# Patient Record
Sex: Female | Born: 1990 | Race: Black or African American | Hispanic: No | Marital: Single | Smoking: Current every day smoker
Health system: Southern US, Community
[De-identification: ages and names within clinical notes are randomized; demographics above are authoritative.]

## PROBLEM LIST (undated history)

## (undated) ENCOUNTER — Inpatient Hospital Stay (HOSPITAL_COMMUNITY): Payer: Self-pay

## (undated) DIAGNOSIS — F32A Depression, unspecified: Secondary | ICD-10-CM

## (undated) DIAGNOSIS — L309 Dermatitis, unspecified: Secondary | ICD-10-CM

## (undated) DIAGNOSIS — T7840XA Allergy, unspecified, initial encounter: Secondary | ICD-10-CM

## (undated) DIAGNOSIS — D649 Anemia, unspecified: Secondary | ICD-10-CM

## (undated) DIAGNOSIS — IMO0002 Reserved for concepts with insufficient information to code with codable children: Secondary | ICD-10-CM

## (undated) DIAGNOSIS — F419 Anxiety disorder, unspecified: Secondary | ICD-10-CM

## (undated) DIAGNOSIS — R51 Headache: Secondary | ICD-10-CM

## (undated) DIAGNOSIS — R87619 Unspecified abnormal cytological findings in specimens from cervix uteri: Secondary | ICD-10-CM

## (undated) DIAGNOSIS — S82899A Other fracture of unspecified lower leg, initial encounter for closed fracture: Secondary | ICD-10-CM

## (undated) DIAGNOSIS — J45909 Unspecified asthma, uncomplicated: Secondary | ICD-10-CM

## (undated) DIAGNOSIS — B999 Unspecified infectious disease: Secondary | ICD-10-CM

## (undated) DIAGNOSIS — J302 Other seasonal allergic rhinitis: Secondary | ICD-10-CM

## (undated) HISTORY — DX: Anemia, unspecified: D64.9

## (undated) HISTORY — DX: Allergy, unspecified, initial encounter: T78.40XA

## (undated) HISTORY — DX: Anxiety disorder, unspecified: F41.9

## (undated) HISTORY — DX: Depression, unspecified: F32.A

## (undated) HISTORY — DX: Unspecified infectious disease: B99.9

## (undated) HISTORY — DX: Other fracture of unspecified lower leg, initial encounter for closed fracture: S82.899A

## (undated) HISTORY — PX: FOOT SURGERY: SHX648

---
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
## 2011-05-25 ENCOUNTER — Emergency Department (HOSPITAL_COMMUNITY)
Admission: EM | Admit: 2011-05-25 | Discharge: 2011-05-26 | Disposition: A | Discharge status: Home / Self Care | Payer: Self-pay | Attending: Emergency Medicine | Admitting: Emergency Medicine

## 2011-05-25 DIAGNOSIS — N898 Other specified noninflammatory disorders of vagina: Secondary | ICD-10-CM | POA: Insufficient documentation

## 2011-05-25 DIAGNOSIS — R300 Dysuria: Secondary | ICD-10-CM | POA: Insufficient documentation

## 2011-05-25 LAB — URINALYSIS, ROUTINE W REFLEX MICROSCOPIC
Bilirubin Urine: NEGATIVE
Glucose, UA: NEGATIVE mg/dL
Hgb urine dipstick: NEGATIVE
Ketones, ur: NEGATIVE mg/dL
Leukocytes, UA: NEGATIVE
Nitrite: NEGATIVE
Protein, ur: NEGATIVE mg/dL
Specific Gravity, Urine: 1.008 (ref 1.005–1.030)
Urobilinogen, UA: 0.2 mg/dL (ref 0.0–1.0)
pH: 7 (ref 5.0–8.0)

## 2011-05-25 LAB — POCT PREGNANCY, URINE: Preg Test, Ur: NEGATIVE

## 2011-05-25 LAB — WET PREP, GENITAL
Trich, Wet Prep: NONE SEEN
WBC, Wet Prep HPF POC: NONE SEEN
Yeast Wet Prep HPF POC: NONE SEEN

## 2011-05-27 LAB — GC/CHLAMYDIA PROBE AMP, GENITAL
Chlamydia, DNA Probe: POSITIVE — AB
GC Probe Amp, Genital: NEGATIVE

## 2011-06-17 LAB — POCT URINALYSIS DIP (DEVICE)
Ketones, ur: 15 mg/dL — AB
Protein, ur: >=300 mg/dL — AB
Specific Gravity, Urine: 1.015 (ref 1.005–1.030)
Urobilinogen, UA: 2 mg/dL — ABNORMAL HIGH (ref 0.0–1.0)
pH: 7 (ref 5.0–8.0)

## 2011-06-17 LAB — POCT PREGNANCY, URINE: Preg Test, Ur: NEGATIVE

## 2011-06-27 LAB — WOUND CULTURE

## 2012-05-24 ENCOUNTER — Inpatient Hospital Stay (HOSPITAL_COMMUNITY)
Admission: AD | Admit: 2012-05-24 | Discharge: 2012-05-24 | Disposition: A | Discharge status: Hospice | Payer: Medicaid Other | Source: Ambulatory Visit | Attending: Obstetrics & Gynecology | Admitting: Obstetrics & Gynecology

## 2012-05-24 ENCOUNTER — Encounter (HOSPITAL_COMMUNITY): Payer: Self-pay | Admitting: *Deleted

## 2012-05-24 DIAGNOSIS — N912 Amenorrhea, unspecified: Secondary | ICD-10-CM

## 2012-05-24 DIAGNOSIS — Z3201 Encounter for pregnancy test, result positive: Secondary | ICD-10-CM | POA: Insufficient documentation

## 2012-05-24 DIAGNOSIS — O210 Mild hyperemesis gravidarum: Secondary | ICD-10-CM

## 2012-05-24 DIAGNOSIS — R112 Nausea with vomiting, unspecified: Secondary | ICD-10-CM | POA: Insufficient documentation

## 2012-05-24 HISTORY — DX: Unspecified abnormal cytological findings in specimens from cervix uteri: R87.619

## 2012-05-24 HISTORY — DX: Unspecified asthma, uncomplicated: J45.909

## 2012-05-24 HISTORY — DX: Reserved for concepts with insufficient information to code with codable children: IMO0002

## 2012-05-24 HISTORY — DX: Other seasonal allergic rhinitis: J30.2

## 2012-05-24 HISTORY — DX: Headache: R51

## 2012-05-24 HISTORY — DX: Dermatitis, unspecified: L30.9

## 2012-05-24 LAB — URINALYSIS, ROUTINE W REFLEX MICROSCOPIC
Hgb urine dipstick: NEGATIVE
Leukocytes, UA: NEGATIVE
Protein, ur: 30 mg/dL — AB
Specific Gravity, Urine: 1.015 (ref 1.005–1.030)
Urobilinogen, UA: 0.2 mg/dL (ref 0.0–1.0)

## 2012-05-24 LAB — POCT PREGNANCY, URINE: Preg Test, Ur: POSITIVE — AB

## 2012-05-24 LAB — URINE MICROSCOPIC-ADD ON

## 2012-05-24 MED ORDER — PROMETHAZINE HCL 25 MG PO TABS: 25.0000 mg | ORAL_TABLET | Freq: Four times a day (QID) | ORAL | Status: DC | PRN

## 2012-05-24 NOTE — MAU Note (Signed)
pateint is in to confirm pregnancy. She c/o n/v and bilateral breast soreness, lmp 04/07/12

## 2012-05-24 NOTE — MAU Provider Note (Signed)
Attestation of Attending Supervision of Advanced Practitioner (CNM/NP): Evaluation and management procedures were performed by the Advanced Practitioner under my supervision and collaboration.  I have reviewed the Advanced Practitioner's note and chart, and I agree with the management and plan.  UGONNA  ANYANWU, MD, FACOG Attending Obstetrician & Gynecologist Faculty Practice, [facility]  

## 2012-05-24 NOTE — Progress Notes (Signed)
Patient is not in lobby 

## 2012-05-24 NOTE — MAU Provider Note (Signed)
  History     CSN: [id number]  Arrival date and time: 05/24/12 2007   None     Chief Complaint  Patient presents with  . Nausea  . Emesis  . Amenorrhea   HPI Pt is here for confirmation of pregnancy with LMP of 04/07/2012.  She denies pregnancy complaints, except for nausea every morning.  Past Medical History  Diagnosis Date  . Abnormal Pap smear   . Asthma   . Headache   . Seasonal allergies   . Eczema     Past Surgical History  Procedure Date  . Foot surgery     Family History  Problem Relation Age of Onset  . Hypertension Father   . Cancer Maternal Grandmother   . Diabetes Maternal Grandmother     History  Substance Use Topics  . Smoking status: Current Every Day Smoker    Types: Cigarettes  . Smokeless tobacco: Not on file  . Alcohol Use: 0.0 oz/week    2-3 Shots of liquor per week     weekly    Allergies: No Known Allergies  No prescriptions prior to admission    ROS Physical Exam   Blood pressure 131/54, pulse 76, temperature 98.5 F (36.9 C), temperature source Oral, resp. rate 16, height 5\' 5"  (1.651 m), weight 60.555 kg (133 lb 8 oz), last menstrual period 04/07/2012, SpO2 100.00%.  Physical Exam  Vitals reviewed. Constitutional: She is oriented to person, place, and time. She appears well-developed and well-nourished.  HENT:  Head: Normocephalic.  Eyes: Pupils are equal, round, and reactive to light.  Neck: Normal range of motion. Neck supple.  Cardiovascular: Normal rate.   Respiratory: Effort normal.  GI: Soft.  Musculoskeletal: Normal range of motion.  Neurological: She is alert and oriented to person, place, and time.  Skin: Skin is warm and dry.  Psychiatric: She has a normal mood and affect.    MAU Course  Procedures     Assessment and Plan  Positive pregnancy test- confirmation of pregnancy letter Proceed with Pregnancy Medicaid application ABCs of pregnancy Nausea in pregnancy- prescription for  phenergan  Lindsey Castillo 05/24/2012, 9:01 PM

## 2012-05-24 NOTE — MAU Note (Signed)
Pt says she would like to have a proof of pregnancy.

## 2012-06-16 ENCOUNTER — Encounter (HOSPITAL_COMMUNITY): Payer: Self-pay | Admitting: *Deleted

## 2012-06-16 ENCOUNTER — Inpatient Hospital Stay (HOSPITAL_COMMUNITY)
Admission: AD | Admit: 2012-06-16 | Discharge: 2012-06-16 | Disposition: A | Discharge status: Home / Self Care | Payer: Medicaid Other | Source: Ambulatory Visit | Attending: Obstetrics & Gynecology | Admitting: Obstetrics & Gynecology

## 2012-06-16 DIAGNOSIS — B9689 Other specified bacterial agents as the cause of diseases classified elsewhere: Secondary | ICD-10-CM | POA: Insufficient documentation

## 2012-06-16 DIAGNOSIS — N760 Acute vaginitis: Secondary | ICD-10-CM | POA: Insufficient documentation

## 2012-06-16 DIAGNOSIS — A499 Bacterial infection, unspecified: Secondary | ICD-10-CM | POA: Insufficient documentation

## 2012-06-16 DIAGNOSIS — O209 Hemorrhage in early pregnancy, unspecified: Secondary | ICD-10-CM | POA: Insufficient documentation

## 2012-06-16 DIAGNOSIS — O2390 Unspecified genitourinary tract infection in pregnancy, unspecified trimester: Secondary | ICD-10-CM | POA: Insufficient documentation

## 2012-06-16 LAB — WET PREP, GENITAL: Trich, Wet Prep: NONE SEEN

## 2012-06-16 LAB — URINALYSIS, ROUTINE W REFLEX MICROSCOPIC
Bilirubin Urine: NEGATIVE
Glucose, UA: NEGATIVE mg/dL
Ketones, ur: NEGATIVE mg/dL
Protein, ur: NEGATIVE mg/dL
Urobilinogen, UA: 0.2 mg/dL (ref 0.0–1.0)

## 2012-06-16 LAB — URINE MICROSCOPIC-ADD ON

## 2012-06-16 LAB — ABO/RH: ABO/RH(D): AB POS

## 2012-06-16 MED ORDER — METRONIDAZOLE 500 MG PO TABS: 500.0000 mg | ORAL_TABLET | Freq: Two times a day (BID) | ORAL | Status: DC

## 2012-06-16 NOTE — MAU Note (Signed)
Pt presents for bleeding that started at 1030 this morning.  She woke up and there was dark red blood in underwear as well as when she wiped.  Bleeding has since decreased and is now only when she wipes after using restroom.

## 2012-06-16 NOTE — MAU Provider Note (Signed)
History     CSN: [id number]  Arrival date and time: 06/16/12 1203   First Provider Initiated Contact with Patient 06/16/12 1253      Chief Complaint  Patient presents with  . Vaginal Bleeding   HPI 21 y.o. G1P0 at [redacted]w[redacted]d with small vaginal bleeding noted this morning, now only notices spotting with wiping. No pain. Plans on prenatal care at [facility].   Past Medical History  Diagnosis Date  . Abnormal Pap smear   . Asthma   . Headache   . Seasonal allergies   . Eczema     Past Surgical History  Procedure Date  . Foot surgery     Family History  Problem Relation Age of Onset  . Hypertension Father   . Cancer Maternal Grandmother   . Diabetes Maternal Grandmother     History  Substance Use Topics  . Smoking status: Former Smoker    Types: Cigarettes    Quit date: 05/24/2012  . Smokeless tobacco: Not on file  . Alcohol Use: No     weekly    Allergies: No Known Allergies  No prescriptions prior to admission    Review of Systems  Constitutional: Negative.   Respiratory: Negative.   Cardiovascular: Negative.   Gastrointestinal: Negative for nausea, vomiting, abdominal pain, diarrhea and constipation.  Genitourinary: Negative for dysuria, urgency, frequency, hematuria and flank pain.       Positive for vaginal bleeding  Musculoskeletal: Negative.   Neurological: Negative.   Psychiatric/Behavioral: Negative.    Physical Exam   Blood pressure 102/64, pulse 81, temperature 99.1 F (37.3 C), temperature source Oral, resp. rate 18, height 5\' 5"  (1.651 m), weight 133 lb 3.2 oz (60.419 kg), last menstrual period 04/07/2012.  Physical Exam  Nursing note and vitals reviewed. Constitutional: She is oriented to person, place, and time. She appears well-developed and well-nourished. No distress.  Cardiovascular: Normal rate.   Respiratory: Effort normal.  GI: Soft. There is no tenderness.  Genitourinary: Vaginal discharge (blood tinged mucous) found.       Cervix  closed   Musculoskeletal: Normal range of motion.  Neurological: She is alert and oriented to person, place, and time.  Skin: Skin is warm and dry.  Psychiatric: She has a normal mood and affect.   + cardiac activity on bedside u/s MAU Course  Procedures  Results for orders placed during the hospital encounter of 06/16/12 (from the past 24 hour(s))  URINALYSIS, ROUTINE W REFLEX MICROSCOPIC     Status: Abnormal   Collection Time   06/16/12 12:05 PM      Component Value Range   Color, Urine YELLOW  YELLOW   APPearance CLEAR  CLEAR   Specific Gravity, Urine 1.015  1.005 - 1.030   pH 7.0  5.0 - 8.0   Glucose, UA NEGATIVE  NEGATIVE mg/dL   Hgb urine dipstick LARGE (*) NEGATIVE   Bilirubin Urine NEGATIVE  NEGATIVE   Ketones, ur NEGATIVE  NEGATIVE mg/dL   Protein, ur NEGATIVE  NEGATIVE mg/dL   Urobilinogen, UA 0.2  0.0 - 1.0 mg/dL   Nitrite NEGATIVE  NEGATIVE   Leukocytes, UA NEGATIVE  NEGATIVE  URINE MICROSCOPIC-ADD ON     Status: Abnormal   Collection Time   06/16/12 12:05 PM      Component Value Range   Squamous Epithelial / LPF FEW (*) RARE   WBC, UA 0-2  <3 WBC/hpf   RBC / HPF 3-6  <3 RBC/hpf   Bacteria, UA FEW (*) RARE  ABO/RH     Status: Normal (Preliminary result)   Collection Time   06/16/12 12:32 PM      Component Value Range   ABO/RH(D) AB POS    WET PREP, GENITAL     Status: Abnormal   Collection Time   06/16/12  1:00 PM      Component Value Range   Yeast Wet Prep HPF POC NONE SEEN  NONE SEEN   Trich, Wet Prep NONE SEEN  NONE SEEN   Clue Cells Wet Prep HPF POC FEW (*) NONE SEEN   WBC, Wet Prep HPF POC FEW (*) NONE SEEN     Assessment and Plan   1. BV (bacterial vaginosis)   2. Bleeding in early pregnancy      Medication List     As of 06/16/2012  1:37 PM    START taking these medications         metroNIDAZOLE 500 MG tablet   Commonly known as: FLAGYL   Take 1 tablet (500 mg total) by mouth 2 (two) times daily.      CONTINUE taking these medications          * albuterol 108 (90 BASE) MCG/ACT inhaler   Commonly known as: PROVENTIL HFA;VENTOLIN HFA      * albuterol (5 MG/ML) 0.5% nebulizer solution   Commonly known as: PROVENTIL      flintstones complete 60 MG chewable tablet      montelukast 10 MG tablet   Commonly known as: SINGULAIR      promethazine 25 MG tablet   Commonly known as: PHENERGAN   Take 1 tablet (25 mg total) by mouth every 6 (six) hours as needed for nausea.      triamcinolone cream 0.1 %   Commonly known as: KENALOG     * Notice: This list has 2 medication(s) that are the same as other medications prescribed for you. Read the directions carefully, and ask your doctor or other care provider to review them with you.        Where to get your medications    These are the prescriptions that you need to pick up. We sent them to a specific pharmacy, so you will need to go there to get them.   [facility] [address] [place], [place] - [address]    [address] [place] [address]    Phone: [phone]        metroNIDAZOLE 500 MG tablet           Start prenatal care as soon as possible Urine culture sent Precautions rev'd  Ossie Yebra 06/16/2012, 1:34 PM

## 2012-06-16 NOTE — MAU Note (Signed)
Pt reports having some bleeding in her panties this morning and now wiping blood is dark and brown. No c/o pain today.

## 2012-06-16 NOTE — MAU Provider Note (Signed)
Attestation of Attending Supervision of Advanced Practitioner (CNM/NP): Evaluation and management procedures were performed by the Advanced Practitioner under my supervision and collaboration.  I have reviewed the Advanced Practitioner's note and chart, and I agree with the management and plan.  HARRAWAY-SMITH, Kelise Kuch 4:57 PM     

## 2012-06-23 ENCOUNTER — Inpatient Hospital Stay (HOSPITAL_COMMUNITY): Payer: Medicaid Other

## 2012-06-23 ENCOUNTER — Encounter (HOSPITAL_COMMUNITY): Payer: Self-pay | Admitting: *Deleted

## 2012-06-23 ENCOUNTER — Inpatient Hospital Stay (HOSPITAL_COMMUNITY)
Admission: AD | Admit: 2012-06-23 | Discharge: 2012-06-23 | Disposition: A | Discharge status: Home / Self Care | Payer: Medicaid Other | Source: Ambulatory Visit | Attending: Obstetrics and Gynecology | Admitting: Obstetrics and Gynecology

## 2012-06-23 DIAGNOSIS — O468X9 Other antepartum hemorrhage, unspecified trimester: Secondary | ICD-10-CM

## 2012-06-23 DIAGNOSIS — O418X90 Other specified disorders of amniotic fluid and membranes, unspecified trimester, not applicable or unspecified: Secondary | ICD-10-CM

## 2012-06-23 DIAGNOSIS — O209 Hemorrhage in early pregnancy, unspecified: Secondary | ICD-10-CM | POA: Insufficient documentation

## 2012-06-23 LAB — CBC
HCT: 32.4 % — ABNORMAL LOW (ref 36.0–46.0)
MCV: 85.7 fL (ref 78.0–100.0)
Platelets: 247 10*3/uL (ref 150–400)
RBC: 3.78 MIL/uL — ABNORMAL LOW (ref 3.87–5.11)
RDW: 14.3 % (ref 11.5–15.5)
WBC: 13.2 10*3/uL — ABNORMAL HIGH (ref 4.0–10.5)

## 2012-06-23 NOTE — MAU Note (Signed)
Pt presents for moderate bleeding that started this morning.  Bleeding has now turned from "burgandy" to brown.

## 2012-06-23 NOTE — MAU Note (Signed)
Patient states she started bleeding dark blood this am. Patient has tissue in her panties that has a small amount of brown discharge. Starting to have abdominal cramping.

## 2012-06-23 NOTE — MAU Provider Note (Signed)
History     CSN: [id number]  Arrival date and time: 06/23/12 1521   First Provider Initiated Contact with Patient 06/23/12 1625      Chief Complaint  Patient presents with  . Vaginal Bleeding   HPI Lindsey Castillo is a 21 y.o. female @ [redacted]w[redacted]d gestation who presents to MAU with vaginal bleeding. She was evaluated here 10/5 for bleeding and had an informal bed side ultrasound that showed a viable IUP. After that visit patient states she stopped bleeding but this morning started again and was heavier than before. She describes the bleeding as equal to a period. Associated symptoms include headache , nausea and lower abdominal pain. She rates the pain as 7/10.   OB History    Grav Para Term Preterm Abortions TAB SAB Ect Mult Living   1               Past Medical History  Diagnosis Date  . Abnormal Pap smear   . Asthma   . Headache   . Seasonal allergies   . Eczema     Past Surgical History  Procedure Date  . Foot surgery     Family History  Problem Relation Age of Onset  . Hypertension Father   . Cancer Maternal Grandmother   . Diabetes Maternal Grandmother     History  Substance Use Topics  . Smoking status: Former Smoker    Types: Cigarettes    Quit date: 05/24/2012  . Smokeless tobacco: Not on file  . Alcohol Use: No     weekly    Allergies: No Known Allergies  Prescriptions prior to admission  Medication Sig Dispense Refill  . albuterol (PROVENTIL HFA;VENTOLIN HFA) 108 (90 BASE) MCG/ACT inhaler Inhale 2 puffs into the lungs 2 (two) times daily as needed. For asthma      . albuterol (PROVENTIL) (5 MG/ML) 0.5% nebulizer solution Take 2.5 mg by nebulization every 6 (six) hours as needed.      . flintstones complete (FLINTSTONES) 60 MG chewable tablet Chew 1 tablet by mouth daily.      . metroNIDAZOLE (FLAGYL) 500 MG tablet Take 1 tablet (500 mg total) by mouth 2 (two) times daily.  14 tablet  0  . montelukast (SINGULAIR) 10 MG tablet Take 10 mg by mouth at  bedtime.      . promethazine (PHENERGAN) 25 MG tablet Take 1 tablet (25 mg total) by mouth every 6 (six) hours as needed for nausea.  30 tablet  0  . triamcinolone cream (KENALOG) 0.1 % Apply 1 application topically 3 (three) times daily as needed. For eczema        Review of Systems  Constitutional: Negative for fever, chills and weight loss.  HENT: Negative for ear pain, nosebleeds, congestion, sore throat and neck pain.   Eyes: Negative for blurred vision, double vision, photophobia and pain.  Respiratory: Negative for cough, shortness of breath and wheezing (asthma).   Cardiovascular: Negative for chest pain, palpitations and leg swelling.  Gastrointestinal: Positive for heartburn, nausea, vomiting and abdominal pain. Negative for diarrhea and constipation.  Genitourinary: Positive for frequency. Negative for dysuria and urgency.  Musculoskeletal: Positive for back pain. Negative for myalgias.  Skin: Negative for itching and rash (eczema).  Neurological: Positive for headaches. Negative for dizziness, sensory change, speech change, seizures and weakness.  Endo/Heme/Allergies: Does not bruise/bleed easily.  Psychiatric/Behavioral: Negative for depression. The patient is not nervous/anxious and does not have insomnia.    Physical Exam   Blood  pressure 132/67, pulse 95, temperature 97.9 F (36.6 C), resp. rate 16, last menstrual period 04/07/2012, SpO2 100.00%.  Physical Exam  Nursing note and vitals reviewed. Constitutional: She is oriented to person, place, and time. She appears well-developed and well-nourished. No distress.  HENT:  Head: Normocephalic and atraumatic.  Eyes: EOM are normal.  Neck: Neck supple.  Cardiovascular: Normal rate.   Respiratory: Effort normal.  GI: Soft. There is no tenderness.  Genitourinary:       External genitalia without lesions. Small amount of brown discharge vaginal vault. Cervix closed, long, no CMT, no adnexal tenderness. Uterus approximately  12 week size.  Musculoskeletal: Normal range of motion.  Neurological: She is alert and oriented to person, place, and time.  Skin: Skin is warm and dry.  Psychiatric: She has a normal mood and affect. Her behavior is normal. Judgment and thought content normal.     MAU Course  Procedures   Assessment: 21 y.o. female @ [redacted]w[redacted]d gestation with vaginal bleeding   Subchorionic hemorrhage  Plan:  Pelvic rest   Start prenatal care, return as needed I have reviewed this patient's vital signs, nurses notes, appropriate labs and imaging. I have discussed the results in detail with the patient and she voices understanding.   Medication List     As of 06/23/2012  6:30 PM    CONTINUE taking these medications         * albuterol 108 (90 BASE) MCG/ACT inhaler   Commonly known as: PROVENTIL HFA;VENTOLIN HFA      * albuterol (5 MG/ML) 0.5% nebulizer solution   Commonly known as: PROVENTIL      flintstones complete 60 MG chewable tablet      metroNIDAZOLE 500 MG tablet   Commonly known as: FLAGYL   Take 1 tablet (500 mg total) by mouth 2 (two) times daily.      montelukast 10 MG tablet   Commonly known as: SINGULAIR      promethazine 25 MG tablet   Commonly known as: PHENERGAN   Take 1 tablet (25 mg total) by mouth every 6 (six) hours as needed for nausea.      triamcinolone cream 0.1 %   Commonly known as: KENALOG     * Notice: This list has 2 medication(s) that are the same as other medications prescribed for you. Read the directions carefully, and ask your doctor or other care provider to review them with you.       NEESE,HOPE, RN, FNP, [facility]  06/23/2012, 4:26 PM

## 2012-06-24 NOTE — MAU Provider Note (Signed)
Attestation of Attending Supervision of Advanced Practitioner (CNM/NP): Evaluation and management procedures were performed by the Advanced Practitioner under my supervision and collaboration.  I have reviewed the Advanced Practitioner's note and chart, and I agree with the management and plan.  Sirron Francesconi 06/24/2012 5:56 AM

## 2012-06-29 ENCOUNTER — Inpatient Hospital Stay (HOSPITAL_COMMUNITY)
Admission: AD | Admit: 2012-06-29 | Discharge: 2012-06-29 | Disposition: A | Discharge status: Home / Self Care | Payer: Medicaid Other | Source: Ambulatory Visit | Attending: Family Medicine | Admitting: Family Medicine

## 2012-06-29 ENCOUNTER — Encounter (HOSPITAL_COMMUNITY): Payer: Self-pay | Admitting: *Deleted

## 2012-06-29 DIAGNOSIS — O99891 Other specified diseases and conditions complicating pregnancy: Secondary | ICD-10-CM | POA: Insufficient documentation

## 2012-06-29 DIAGNOSIS — R109 Unspecified abdominal pain: Secondary | ICD-10-CM | POA: Insufficient documentation

## 2012-06-29 DIAGNOSIS — R143 Flatulence: Secondary | ICD-10-CM

## 2012-06-29 DIAGNOSIS — R142 Eructation: Secondary | ICD-10-CM | POA: Insufficient documentation

## 2012-06-29 DIAGNOSIS — R141 Gas pain: Secondary | ICD-10-CM | POA: Insufficient documentation

## 2012-06-29 LAB — CBC WITH DIFFERENTIAL/PLATELET
Hemoglobin: 10.8 g/dL — ABNORMAL LOW (ref 12.0–15.0)
Lymphocytes Relative: 21 % (ref 12–46)
Lymphs Abs: 2.6 10*3/uL (ref 0.7–4.0)
Monocytes Relative: 7 % (ref 3–12)
Neutrophils Relative %: 72 % (ref 43–77)
Platelets: 239 10*3/uL (ref 150–400)
RBC: 3.68 MIL/uL — ABNORMAL LOW (ref 3.87–5.11)
WBC: 12.5 10*3/uL — ABNORMAL HIGH (ref 4.0–10.5)

## 2012-06-29 LAB — URINALYSIS, ROUTINE W REFLEX MICROSCOPIC
Bilirubin Urine: NEGATIVE
Hgb urine dipstick: NEGATIVE
Nitrite: NEGATIVE
Specific Gravity, Urine: <1.005 — ABNORMAL LOW (ref 1.005–1.030)
Urobilinogen, UA: 0.2 mg/dL (ref 0.0–1.0)
pH: 7 (ref 5.0–8.0)

## 2012-06-29 LAB — WET PREP, GENITAL
Clue Cells Wet Prep HPF POC: NONE SEEN
Trich, Wet Prep: NONE SEEN
Yeast Wet Prep HPF POC: NONE SEEN

## 2012-06-29 MED ORDER — SIMETHICONE 80 MG PO CHEW
160.0000 mg | CHEWABLE_TABLET | Freq: Once | ORAL | Status: AC
  Administered 2012-06-29: 160 mg via ORAL
  Filled 2012-06-29: qty 2

## 2012-06-29 MED ORDER — OXYCODONE-ACETAMINOPHEN 5-325 MG PO TABS
1.0000 | ORAL_TABLET | ORAL | Status: DC | PRN
  Administered 2012-06-29: 1 via ORAL
  Filled 2012-06-29: qty 1

## 2012-06-29 NOTE — MAU Note (Signed)
Pt states bleeding from stopped yesterday

## 2012-06-29 NOTE — MAU Provider Note (Signed)
History     CSN: [id number]  Arrival date and time: 06/29/12 0124   None     Chief Complaint  Patient presents with  . Abdominal Pain   HPI Lindsey Castillo is a 21 y.o. G1P0 who arrived via EMS with c/o sudden onset sharp abdominal pain. She rates the pain a 9/10. She states that it is bilateral, along both sides of her abdomen. She denies any vaginal bleeding, vaginal discharge. She states that she had some vaginal bleeding earlier in the pregnancy, but has not had any bleeding in "several" days.   Past Medical History  Diagnosis Date  . Abnormal Pap smear   . Asthma   . Headache   . Seasonal allergies   . Eczema     Past Surgical History  Procedure Date  . Foot surgery     Family History  Problem Relation Age of Onset  . Hypertension Father   . Cancer Maternal Grandmother   . Diabetes Maternal Grandmother     History  Substance Use Topics  . Smoking status: Former Smoker    Types: Cigarettes    Quit date: 05/24/2012  . Smokeless tobacco: Not on file  . Alcohol Use: No     weekly    Allergies: No Known Allergies  Prescriptions prior to admission  Medication Sig Dispense Refill  . albuterol (PROVENTIL HFA;VENTOLIN HFA) 108 (90 BASE) MCG/ACT inhaler Inhale 2 puffs into the lungs 2 (two) times daily as needed. For asthma      . albuterol (PROVENTIL) (5 MG/ML) 0.5% nebulizer solution Take 2.5 mg by nebulization every 6 (six) hours as needed.      . flintstones complete (FLINTSTONES) 60 MG chewable tablet Chew 1 tablet by mouth daily.      . metroNIDAZOLE (FLAGYL) 500 MG tablet Take 1 tablet (500 mg total) by mouth 2 (two) times daily.  14 tablet  0  . montelukast (SINGULAIR) 10 MG tablet Take 10 mg by mouth at bedtime.      . promethazine (PHENERGAN) 25 MG tablet Take 1 tablet (25 mg total) by mouth every 6 (six) hours as needed for nausea.  30 tablet  0  . triamcinolone cream (KENALOG) 0.1 % Apply 1 application topically 3 (three) times daily as needed. For  eczema        Review of Systems  Constitutional: Negative for fever and chills.  Respiratory: Negative for cough.   Cardiovascular: Negative for chest pain.  Gastrointestinal: Positive for nausea (reports "all day nausea") and abdominal pain (see HPI). Negative for heartburn, vomiting, diarrhea and constipation.       Reports frequent flatus  Genitourinary: Negative for dysuria, urgency and frequency.  Musculoskeletal: Negative for myalgias.  Neurological: Negative for headaches.   Physical Exam   Last menstrual period 04/07/2012.  Physical Exam  Nursing note and vitals reviewed. Constitutional: She is oriented to person, place, and time. She appears well-developed and well-nourished.  HENT:  Head: Normocephalic.  Cardiovascular: Normal rate and regular rhythm.   Respiratory: Effort normal and breath sounds normal.  GI: Soft. Bowel sounds are normal. She exhibits no distension. There is no tenderness. There is no rebound and no guarding.       +flatus  Genitourinary:         External: normal Vagina: normal, small amount of mucous Cervix: pink, smooth, no CMT. Closed/Thick/High Uterus: 12 weeks  Neurological: She is alert and oriented to person, place, and time.  Skin: Skin is warm and dry.  MAU Course  Procedures   0233: patient has been given simethicone, and reports increased flatus and pain improving.    Results for orders placed during the hospital encounter of 06/29/12 (from the past 24 hour(s))  CBC WITH DIFFERENTIAL     Status: Abnormal   Collection Time   06/29/12  1:55 AM      Component Value Range   WBC 12.5 (*) 4.0 - 10.5 K/uL   RBC 3.68 (*) 3.87 - 5.11 MIL/uL   Hemoglobin 10.8 (*) 12.0 - 15.0 g/dL   HCT 47.8 (*) 29.5 - 62.1 %   MCV 85.1  78.0 - 100.0 fL   MCH 29.3  26.0 - 34.0 pg   MCHC 34.5  30.0 - 36.0 g/dL   RDW 30.8  65.7 - 84.6 %   Platelets 239  150 - 400 K/uL   Neutrophils Relative 72  43 - 77 %   Neutro Abs 9.0 (*) 1.7 - 7.7 K/uL    Lymphocytes Relative 21  12 - 46 %   Lymphs Abs 2.6  0.7 - 4.0 K/uL   Monocytes Relative 7  3 - 12 %   Monocytes Absolute 0.8  0.1 - 1.0 K/uL   Eosinophils Relative 1  0 - 5 %   Eosinophils Absolute 0.1  0.0 - 0.7 K/uL   Basophils Relative 0  0 - 1 %   Basophils Absolute 0.0  0.0 - 0.1 K/uL  URINALYSIS, ROUTINE W REFLEX MICROSCOPIC     Status: Abnormal   Collection Time   06/29/12  2:00 AM      Component Value Range   Color, Urine YELLOW  YELLOW   APPearance CLEAR  CLEAR   Specific Gravity, Urine <1.005 (*) 1.005 - 1.030   pH 7.0  5.0 - 8.0   Glucose, UA NEGATIVE  NEGATIVE mg/dL   Hgb urine dipstick NEGATIVE  NEGATIVE   Bilirubin Urine NEGATIVE  NEGATIVE   Ketones, ur NEGATIVE  NEGATIVE mg/dL   Protein, ur NEGATIVE  NEGATIVE mg/dL   Urobilinogen, UA 0.2  0.0 - 1.0 mg/dL   Nitrite NEGATIVE  NEGATIVE   Leukocytes, UA NEGATIVE  NEGATIVE  WET PREP, GENITAL     Status: Abnormal   Collection Time   06/29/12  2:30 AM      Component Value Range   Yeast Wet Prep HPF POC NONE SEEN  NONE SEEN   Trich, Wet Prep NONE SEEN  NONE SEEN   Clue Cells Wet Prep HPF POC NONE SEEN  NONE SEEN   WBC, Wet Prep HPF POC FEW (*) NONE SEEN   Assessment and Plan   1. Excessive flatus   Given list of safe OTC medications to take for gastric upset during pregnancy  Tawnya Crook 06/29/2012, 1:56 AM

## 2012-06-29 NOTE — MAU Note (Signed)
Pt states she has been having pain for about an hour in the upper part of abdomen. Not her lower abdomen

## 2012-07-01 NOTE — MAU Provider Note (Signed)
Chart reviewed and agree with management and plan.  

## 2012-07-11 ENCOUNTER — Other Ambulatory Visit: Payer: Self-pay | Admitting: Nurse Practitioner

## 2012-07-11 DIAGNOSIS — Z3689 Encounter for other specified antenatal screening: Secondary | ICD-10-CM

## 2012-08-01 ENCOUNTER — Ambulatory Visit (INDEPENDENT_AMBULATORY_CARE_PROVIDER_SITE_OTHER): Payer: Medicaid Other | Admitting: Obstetrics and Gynecology

## 2012-08-01 DIAGNOSIS — Z331 Pregnant state, incidental: Secondary | ICD-10-CM

## 2012-08-01 NOTE — Progress Notes (Signed)
NOB Interview. [facility]. States has had all labs done., except QUAD screen. ROI signed.Has Anatomy U/S scheduled at [facility] 07/17/12.  U/S from 06/23/12 reviewed with VL.  To use 01/06/13 as EDC. Quad screen done today. To keep appt 08/06/12 for U/S.  Pt was previously seen at [facility] for asthma.  To schedule appt for eval.

## 2012-08-02 ENCOUNTER — Encounter: Payer: Self-pay | Admitting: Obstetrics and Gynecology

## 2012-08-02 DIAGNOSIS — L309 Dermatitis, unspecified: Secondary | ICD-10-CM | POA: Insufficient documentation

## 2012-08-02 DIAGNOSIS — J45909 Unspecified asthma, uncomplicated: Secondary | ICD-10-CM | POA: Insufficient documentation

## 2012-08-02 LAB — AFP, QUAD SCREEN
AFP: 48.3 IU/mL
Curr Gest Age: 17.3 wks.days
Interpretation-AFP: NEGATIVE
MoM for AFP: 1.17
MoM for INH: 1.49
Open Spina bifida: NEGATIVE
uE3 Mom: 1.01
uE3 Value: 0.8 ng/mL

## 2012-08-06 ENCOUNTER — Ambulatory Visit (HOSPITAL_COMMUNITY)
Admission: RE | Admit: 2012-08-06 | Discharge: 2012-08-06 | Disposition: A | Discharge status: Home / Self Care | Payer: Medicaid Other | Source: Ambulatory Visit | Attending: Nurse Practitioner | Admitting: Nurse Practitioner

## 2012-08-06 DIAGNOSIS — Z1389 Encounter for screening for other disorder: Secondary | ICD-10-CM | POA: Insufficient documentation

## 2012-08-06 DIAGNOSIS — Z3689 Encounter for other specified antenatal screening: Secondary | ICD-10-CM

## 2012-08-06 DIAGNOSIS — Z363 Encounter for antenatal screening for malformations: Secondary | ICD-10-CM | POA: Insufficient documentation

## 2012-08-06 DIAGNOSIS — O358XX0 Maternal care for other (suspected) fetal abnormality and damage, not applicable or unspecified: Secondary | ICD-10-CM | POA: Insufficient documentation

## 2012-08-15 ENCOUNTER — Telehealth: Payer: Self-pay | Admitting: Obstetrics and Gynecology

## 2012-08-15 NOTE — Telephone Encounter (Signed)
VM from [facility].  Pt called last PM with upper abd pain. Some nausea. 19-20 wks. Reflux? To try Zantac, eat, then Ibuprofen. Call to check status.

## 2012-08-29 ENCOUNTER — Encounter: Payer: Medicaid Other | Admitting: Obstetrics and Gynecology

## 2012-09-12 NOTE — L&D Delivery Note (Signed)
Delivery Note Labored progressed after AROM w/o further augmentation.  Cx complete at 1445.  Pushed well to SVD at 3:25 PM; a viable female "[name]" was delivered via Vaginal, Spontaneous Delivery (Presentation: Left Occiput Anterior).  APGAR: 9, 9; weight TBA .   Placenta status: Intact, Spontaneous, Schultz.  Cord: 3 vessels with the following complications: None.  Cord pH: not collected.  Anesthesia: Epidural  Episiotomy: None Lacerations: None Suture Repair: n/a Est. Blood Loss (mL): 250  Mom to postpartum.  Baby to nursery-stable. Planning outpatient circumcision. Pt originally planned to formula-feed, but open to trying breast, and strongly enc'd pt to do so. Pt, newborn, FOB, and both mothers bonding appropriately.  [name] H 01/01/2013, 4:25 PM

## 2012-09-18 DIAGNOSIS — O209 Hemorrhage in early pregnancy, unspecified: Secondary | ICD-10-CM | POA: Insufficient documentation

## 2012-09-20 ENCOUNTER — Encounter: Payer: Medicaid Other | Admitting: Obstetrics and Gynecology

## 2012-09-20 ENCOUNTER — Ambulatory Visit (INDEPENDENT_AMBULATORY_CARE_PROVIDER_SITE_OTHER): Payer: Medicaid Other | Admitting: Obstetrics and Gynecology

## 2012-09-20 VITALS — BP 104/62 | Wt 158.0 lb

## 2012-09-20 DIAGNOSIS — O0930 Supervision of pregnancy with insufficient antenatal care, unspecified trimester: Secondary | ICD-10-CM

## 2012-09-20 DIAGNOSIS — Z331 Pregnant state, incidental: Secondary | ICD-10-CM

## 2012-09-20 NOTE — Progress Notes (Signed)
Pt is here today for her NOB work-up. Pt stated pap was done 06/2012 or 07/2012 wnl. Pt stated no issues today. Pt needs a rx for her asthma

## 2012-09-21 DIAGNOSIS — O0930 Supervision of pregnancy with insufficient antenatal care, unspecified trimester: Secondary | ICD-10-CM | POA: Insufficient documentation

## 2012-09-21 NOTE — Progress Notes (Signed)
   Lindsey Castillo is being seen today for her first obstetrical visit at [redacted]w[redacted]d gestation by 1st trim [place].  She reports gas, and her asthma has been a little more of an issue since pregnancy. Has been using inhaler and nebulizer more lately. Followed by [facility].  Had initial visit at HD, NOB interview at 17 weeks, Anatomy scan at [facility] then no visit until today.  Her obstetrical history is significant for: Patient Active Problem List  Diagnosis  . Asthma  . Eczema  . Bleeding in early pregnancy  . Insufficient prenatal care    Relationship with FOB:  Tsosie Billing, not present with her today  She is employed Conservation officer, nature at [employer]:   Not sure  Pregnancy history fully reviewed.  The following portions of the patient's history were reviewed and updated as appropriate: allergies, current medications, past family history, past medical history, past social history, past surgical history and problem list.  Review of Systems Pertinent ROS is described in HPI   Objective:   BP 104/62  Wt 158 lb (71.668 kg)  LMP 04/07/2012 Wt Readings from Last 1 Encounters:  09/20/12 158 lb (71.668 kg)   BMI: There is no height on file to calculate BMI.  General: alert, cooperative and no distress HEENT: grossly normal  Thyroid: normal  Respiratory: clear to auscultation bilaterally Cardiovascular: regular rate and rhythm,  Breasts:  No dominant masses, nipples erect Gastrointestinal: soft, non-tender; no masses,  no organomegaly Extremities: extremities normal, no pain or edema Vaginal Bleeding: None  EXTERNAL GENITALIA: normal appearing vulva with no masses, tenderness or lesions VAGINA: no abnormal discharge or lesions CERVIX: no lesions or cervical motion tenderness; cervix closed, long, firm UTERUS: gravid and consistent with 24 weeks ADNEXA: no masses palpable and nontender OB EXAM PELVIMETRY: appears adequate  FHR:  140  bpm  Assessment:    Pregnancy at  [redacted]w[redacted]d Asthma Insufficient Prenatal Care   Plan:     Prenatal panel reviewed and discussed with the patient:  yes  Pap smear collected:  yes GC/Chlamydia collected:  yes Wet prep:  Done--neg Discussion of Genetic testing options: Quad screen --- WNL Prenatal vitamins recommended  Plan of care: Next visit:  4 weeks for ROB, glucola Other anticipated f/u:   Pt will monitor asthma sx, will refer if sx worsen   Haroldine Laws, CNM Nigel Bridgeman CNM

## 2012-09-22 LAB — PAP IG, CT-NG, RFX HPV ASCU
Chlamydia Probe Amp: NEGATIVE
GC Probe Amp: NEGATIVE

## 2012-10-02 ENCOUNTER — Encounter: Payer: Medicaid Other | Admitting: Obstetrics and Gynecology

## 2012-10-05 ENCOUNTER — Telehealth: Payer: Self-pay | Admitting: Obstetrics and Gynecology

## 2012-10-09 NOTE — Telephone Encounter (Signed)
Returned pt's call. LM to return call.   

## 2012-10-11 NOTE — Telephone Encounter (Signed)
Returned pt's call. LM to return call.   

## 2012-10-13 ENCOUNTER — Telehealth: Payer: Self-pay | Admitting: Obstetrics and Gynecology

## 2012-10-13 NOTE — Telephone Encounter (Signed)
TC from pt at 27wks c/o feels tightening about every other day, a cpl times per day, just now today feels more uncomfortable not really painful, has only drank about 2 cups of water. Denies any VB, LOF, has GFM. Instructed to drink at least 2 large glasses of water, lay down, continue to monitor, if discomfort persists, or painful menstrual like cramps, or tightening >5x/hour, notify us. Keep scheduled appointment.

## 2012-10-18 ENCOUNTER — Encounter: Payer: Self-pay | Admitting: Obstetrics and Gynecology

## 2012-10-18 ENCOUNTER — Ambulatory Visit: Payer: Medicaid Other | Admitting: Obstetrics and Gynecology

## 2012-10-18 ENCOUNTER — Other Ambulatory Visit: Payer: Medicaid Other

## 2012-10-18 VITALS — BP 90/58 | Wt 161.0 lb

## 2012-10-18 DIAGNOSIS — Z331 Pregnant state, incidental: Secondary | ICD-10-CM

## 2012-10-18 LAB — CBC
HCT: 31.7 % — ABNORMAL LOW (ref 36.0–46.0)
MCHC: 34.4 g/dL (ref 30.0–36.0)
Platelets: 233 10*3/uL (ref 150–400)
RDW: 13.7 % (ref 11.5–15.5)

## 2012-10-18 MED ORDER — ALBUTEROL SULFATE (5 MG/ML) 0.5% IN NEBU: 2.5000 mg | INHALATION_SOLUTION | Freq: Four times a day (QID) | RESPIRATORY_TRACT | Status: DC | PRN

## 2012-10-18 MED ORDER — ALBUTEROL SULFATE HFA 108 (90 BASE) MCG/ACT IN AERS: 2.0000 | INHALATION_SPRAY | Freq: Two times a day (BID) | RESPIRATORY_TRACT | Status: DC | PRN

## 2012-10-18 MED ORDER — MONTELUKAST SODIUM 10 MG PO TABS: 10.0000 mg | ORAL_TABLET | Freq: Every day | ORAL | Status: DC

## 2012-10-18 NOTE — Progress Notes (Signed)
[redacted]w[redacted]d Pt has no complaints  Glucola given today  Pt declines flu vaccine

## 2012-10-18 NOTE — Addendum Note (Signed)
Addended by: Darien Ramus on: 10/18/2012 12:44 PM   Modules accepted: Orders

## 2012-10-18 NOTE — Progress Notes (Signed)
[redacted]w[redacted]d  GFM  

## 2012-10-19 ENCOUNTER — Telehealth: Payer: Self-pay | Admitting: Obstetrics and Gynecology

## 2012-10-19 LAB — RPR

## 2012-10-19 NOTE — Telephone Encounter (Signed)
Tc to [facility] pharmacy regarding below.  Informed Weston Brass, the pharmacist of SR's comments below, voices agreement.

## 2012-10-19 NOTE — Telephone Encounter (Signed)
Message copied by Delon Sacramento on Fri Oct 19, 2012  8:50 AM ------      Message from: Silverio Lay      Created: Thu Oct 18, 2012  6:36 PM      Regarding: RE: Rx dosage question       We only meant to refill her inhaler not the nebulizer      thanks      ----- Message -----         From: Delon Sacramento, RN         Sent: 10/18/2012   4:22 PM           To: Esmeralda Arthur, MD      Subject: Rx dosage question                                       Dr. Lynford Humphrey,            [facility] called regarding a rx that you sent for this pt.  It was for the Nebulizer soln. 0.5%, they state that it does not come in that strength and want to know if you to change it to something else.            Donnamae Jude

## 2012-10-24 NOTE — Telephone Encounter (Signed)
Pt received refill on 10/18/2012 per visit note

## 2012-11-01 ENCOUNTER — Encounter: Payer: Self-pay | Admitting: Obstetrics and Gynecology

## 2012-11-01 ENCOUNTER — Ambulatory Visit: Payer: Medicaid Other | Admitting: Obstetrics and Gynecology

## 2012-11-01 VITALS — BP 100/62 | Wt 162.0 lb

## 2012-11-01 DIAGNOSIS — Z3490 Encounter for supervision of normal pregnancy, unspecified, unspecified trimester: Secondary | ICD-10-CM

## 2012-11-01 NOTE — Progress Notes (Signed)
[redacted]w[redacted]d hgb 10.9, GCT 109 neg, RPR NR FKCs PTL precautions C/o pressure and BHs about 2 times per day

## 2012-11-15 ENCOUNTER — Encounter: Payer: Medicaid Other | Admitting: Family Medicine

## 2012-11-18 ENCOUNTER — Telehealth (HOSPITAL_COMMUNITY): Payer: Self-pay | Admitting: Obstetrics and Gynecology

## 2012-11-18 NOTE — Telephone Encounter (Signed)
Calls with c/o of mild intermittent pelvic pressure. Denies cramping/UCs, ROM or bldg.  Reports active fetus.  States pressure is relieved when she passes gas.  D/W pt warm fluids, warm bath and ambulating to facilitate passage of gas.  Revd s/s preterm labor.  To MAU if no relief or increased symptoms. Verbalizes understanding.

## 2012-11-21 ENCOUNTER — Telehealth: Payer: Self-pay | Admitting: Certified Nurse Midwife

## 2012-11-21 NOTE — Telephone Encounter (Signed)
TC regarding 1 episode of pelvic and rectal pressure that lasted 1 min 30 sec while out shopping.  She has BH over that last couple of weeks which have resolved with rest and hydration. No noted FM since, but reports feeling a heart beat feeling in her abdomen.    Advised to eat breakfast and have a cold drink then lay down and do FKCs for 1 hour.  Advised to call office if she does not have 4 kicks in 1 hour.

## 2012-11-23 ENCOUNTER — Encounter: Payer: Medicaid Other | Admitting: Obstetrics and Gynecology

## 2012-11-26 ENCOUNTER — Encounter: Payer: Medicaid Other | Admitting: Obstetrics and Gynecology

## 2012-11-26 ENCOUNTER — Encounter: Payer: Medicaid Other | Admitting: Family Medicine

## 2012-11-29 ENCOUNTER — Encounter: Payer: Medicaid Other | Admitting: Obstetrics and Gynecology

## 2012-12-20 ENCOUNTER — Inpatient Hospital Stay (HOSPITAL_COMMUNITY)
Admission: AD | Admit: 2012-12-20 | Discharge: 2012-12-20 | Disposition: A | Discharge status: Home / Self Care | Payer: Medicaid Other | Source: Ambulatory Visit | Attending: Obstetrics and Gynecology | Admitting: Obstetrics and Gynecology

## 2012-12-20 ENCOUNTER — Encounter (HOSPITAL_COMMUNITY): Payer: Self-pay | Admitting: *Deleted

## 2012-12-20 DIAGNOSIS — R109 Unspecified abdominal pain: Secondary | ICD-10-CM | POA: Insufficient documentation

## 2012-12-20 DIAGNOSIS — O209 Hemorrhage in early pregnancy, unspecified: Secondary | ICD-10-CM

## 2012-12-20 DIAGNOSIS — L309 Dermatitis, unspecified: Secondary | ICD-10-CM

## 2012-12-20 DIAGNOSIS — O479 False labor, unspecified: Secondary | ICD-10-CM

## 2012-12-20 MED ORDER — HYDROCODONE-ACETAMINOPHEN 5-325 MG PO TABS
1.0000 | ORAL_TABLET | Freq: Once | ORAL | Status: AC
  Administered 2012-12-20: 1 via ORAL
  Filled 2012-12-20: qty 1

## 2012-12-20 MED ORDER — CYCLOBENZAPRINE HCL 10 MG PO TABS
10.0000 mg | ORAL_TABLET | Freq: Once | ORAL | Status: AC
  Administered 2012-12-20: 10 mg via ORAL
  Filled 2012-12-20: qty 1

## 2012-12-20 NOTE — MAU Provider Note (Signed)
History   Lindsey Castillo is a 21y.o. SBF at [redacted]w[redacted]d who presents unannounced w/ pain since around 2300.  Unsure if contracting; pain is constant, and primarily rectal pressure in nature.  Reports BM, loose before coming to hospital.  Accompanied by s.o.  No VB or LOF.  No UTI or PIH s/s.   GFM.  Reports cx at last exam about 1.5 weeks ago not dilated, but "thinning out."  Patient Active Problem List  Diagnosis  . Asthma  . Eczema  . Bleeding in early pregnancy  . Insufficient prenatal care    CSN: [id number]  Arrival date and time: 12/20/12 0101   First Provider Initiated Contact with Patient 12/20/12 0118      Chief Complaint  Patient presents with  . Labor Eval   HPI  OB History   Grav Para Term Preterm Abortions TAB SAB Ect Mult Living   1               Past Medical History  Diagnosis Date  . Headache   . Seasonal allergies   . Eczema   . Abnormal Pap smear     last pap 07/2012  . Infection     trich  . Infection     BV  . Infection     HX OF FREQUENT  . Asthma     USES INHALER  . Fracture, ankle AGE 3    Past Surgical History  Procedure Laterality Date  . Foot surgery      Family History  Problem Relation Age of Onset  . Hypertension Father   . Cancer Maternal Grandmother   . Diabetes Maternal Grandmother   . Depression Mother   . Birth defects Sister     HEART MURMUR  . Asthma Brother   . Miscarriages / [place] Cousin   . Thyroid disease Sister     History  Substance Use Topics  . Smoking status: Former Smoker    Types: Cigarettes    Quit date: 05/24/2012  . Smokeless tobacco: Never Used  . Alcohol Use: No     Comment: weekly; LAST DRANK 05/16/12    Allergies:  Allergies  Allergen Reactions  . Other     SEASONAL    Prescriptions prior to admission  Medication Sig Dispense Refill  . acetaminophen (TYLENOL) 500 MG tablet Take 500 mg by mouth every 6 (six) hours as needed for pain.      Marland Kitchen albuterol (PROVENTIL) (5 MG/ML) 0.5%  nebulizer solution Take 0.5 mLs (2.5 mg total) by nebulization every 6 (six) hours as needed.  20 mL  3  . montelukast (SINGULAIR) 10 MG tablet Take 1 tablet (10 mg total) by mouth at bedtime.  30 tablet  3  . Prenatal Vit-Fe Fumarate-FA (PRENATAL MULTIVITAMIN) TABS Take 1 tablet by mouth daily.      Marland Kitchen triamcinolone cream (KENALOG) 0.1 % Apply 1 application topically 3 (three) times daily as needed. For eczema      . albuterol (PROVENTIL HFA;VENTOLIN HFA) 108 (90 BASE) MCG/ACT inhaler Inhale 2 puffs into the lungs 2 (two) times daily as needed. For asthma  1 Inhaler  3  . flintstones complete (FLINTSTONES) 60 MG chewable tablet Chew 1 tablet by mouth daily.      . promethazine (PHENERGAN) 25 MG tablet Take 1 tablet (25 mg total) by mouth every 6 (six) hours as needed for nausea.  30 tablet  0  . [DISCONTINUED] metroNIDAZOLE (FLAGYL) 500 MG tablet Take 1 tablet (500 mg total)  by mouth 2 (two) times daily.  14 tablet  0    ROS--see HPI Physical Exam   Height 5\' 4"  (1.626 m), weight 175 lb (79.379 kg), last menstrual period 04/07/2012.  Physical Exam  Constitutional: She is oriented to person, place, and time. She appears well-developed and well-nourished. She appears distressed.  Grimace and shifting wt off bottom; no labor breathing; tense overall  HENT:  Head: Normocephalic.  Eyes: Pupils are equal, round, and reactive to light.  Cardiovascular: Normal rate.   Respiratory: Effort normal.  GI: Soft.  gravid  Genitourinary:  1.5/80/-2 to -1, vtx, intact, posterior, soft  Neurological: She is alert and oriented to person, place, and time.  Skin: Skin is warm and dry.    MAU Course  Procedures 1. NST: 140, reactive, no decels, moderate variability; toco: occ'l ctx  Assessment and Plan  1. [redacted]w[redacted]d 2. Threatened vs false labor 3. Cat I FHT  1. D/c home with labor precautions and FKC; rev'd comfort measures. 2. Given Flexeril 10mg  po x1 & Vicodin 1 tab po x1 before d/c for therapeutic  rest 3.  Keep today's appt at [facility] at 1600, or f/u prn, worsening s/s, concerns  Lindsey Castillo H 12/20/2012, 1:33 AM

## 2012-12-20 NOTE — MAU Note (Signed)
Pt reports cramping and contractions for the last 30 minutes

## 2012-12-25 ENCOUNTER — Encounter (HOSPITAL_COMMUNITY): Payer: Self-pay | Admitting: *Deleted

## 2012-12-25 ENCOUNTER — Inpatient Hospital Stay (HOSPITAL_COMMUNITY)
Admission: AD | Admit: 2012-12-25 | Discharge: 2012-12-26 | Disposition: A | Discharge status: Home / Self Care | Payer: Medicaid Other | Source: Ambulatory Visit | Attending: Obstetrics and Gynecology | Admitting: Obstetrics and Gynecology

## 2012-12-25 DIAGNOSIS — O479 False labor, unspecified: Secondary | ICD-10-CM | POA: Insufficient documentation

## 2012-12-25 NOTE — MAU Note (Signed)
Contractions 5-8 minutes apart. No vaginal bleeding or leaking of fluid.

## 2012-12-26 MED ORDER — MORPHINE SULFATE 10 MG/ML IJ SOLN
10.0000 mg | Freq: Once | INTRAMUSCULAR | Status: AC
  Administered 2012-12-26: 10 mg via INTRAMUSCULAR

## 2012-12-26 MED ORDER — MORPHINE SULFATE 10 MG/ML IJ SOLN
10.0000 mg | Freq: Once | INTRAMUSCULAR | Status: DC
  Filled 2012-12-26: qty 1

## 2012-12-26 MED ORDER — ZOLPIDEM TARTRATE 10 MG PO TABS: 10.0000 mg | ORAL_TABLET | Freq: Every evening | ORAL | Status: DC | PRN

## 2012-12-26 MED ORDER — PROMETHAZINE HCL 25 MG/ML IJ SOLN
12.5000 mg | Freq: Once | INTRAMUSCULAR | Status: AC
  Administered 2012-12-26: 12.5 mg via INTRAMUSCULAR
  Filled 2012-12-26: qty 1

## 2012-12-26 NOTE — MAU Provider Note (Signed)
History    Leela Vanbrocklin is a 22y.o. SBF at [redacted]w[redacted]d who presents unannounced w/ pain since around 2200. Contracting irregularly; reports ctxs every "8 min" when at home.  Vaginal and pelvic pain is constant, and primarily rectal pressure in nature. Accompanied by s.o. And a female visitor.  No VB or LOF. No UTI or PIH s/s. GFM. Pt states she has been walking up and down stairs to try and get pain to stop. Reports cx at last exam 2.5-3 cm on Mon per "Dr. Normand Sloop;"    CSN: [id number]  Arrival date and time: 12/25/12 2340   First Provider Initiated Contact with Patient 12/26/12 0000      Chief Complaint  Patient presents with  . Contractions   HPI  OB History   Grav Para Term Preterm Abortions TAB SAB Ect Mult Living   1               Past Medical History  Diagnosis Date  . Headache   . Seasonal allergies   . Eczema   . Abnormal Pap smear     last pap 07/2012  . Infection     trich  . Infection     BV  . Infection     HX OF FREQUENT  . Asthma     USES INHALER  . Fracture, ankle AGE 22    Past Surgical History  Procedure Laterality Date  . Foot surgery      Family History  Problem Relation Age of Onset  . Hypertension Father   . Cancer Maternal Grandmother   . Diabetes Maternal Grandmother   . Depression Mother   . Birth defects Sister     HEART MURMUR  . Asthma Brother   . Miscarriages / [place] Cousin   . Thyroid disease Sister     History  Substance Use Topics  . Smoking status: Former Smoker    Types: Cigarettes    Quit date: 05/24/2012  . Smokeless tobacco: Never Used  . Alcohol Use: No     Comment: weekly; LAST DRANK 05/16/12    Allergies:  Allergies  Allergen Reactions  . Other     SEASONAL    Prescriptions prior to admission  Medication Sig Dispense Refill  . albuterol (PROVENTIL HFA;VENTOLIN HFA) 108 (90 BASE) MCG/ACT inhaler Inhale 2 puffs into the lungs 2 (two) times daily as needed. For asthma  1 Inhaler  3  . albuterol (PROVENTIL)  (5 MG/ML) 0.5% nebulizer solution Take 0.5 mLs (2.5 mg total) by nebulization every 6 (six) hours as needed.  20 mL  3  . montelukast (SINGULAIR) 10 MG tablet Take 1 tablet (10 mg total) by mouth at bedtime.  30 tablet  3  . acetaminophen (TYLENOL) 500 MG tablet Take 500 mg by mouth every 6 (six) hours as needed for pain.      . flintstones complete (FLINTSTONES) 60 MG chewable tablet Chew 1 tablet by mouth daily.      . Prenatal Vit-Fe Fumarate-FA (PRENATAL MULTIVITAMIN) TABS Take 1 tablet by mouth daily.      . promethazine (PHENERGAN) 25 MG tablet Take 1 tablet (25 mg total) by mouth every 6 (six) hours as needed for nausea.  30 tablet  0  . triamcinolone cream (KENALOG) 0.1 % Apply 1 application topically 3 (three) times daily as needed. For eczema        ROS--see HPI Physical Exam   Blood pressure 123/74, pulse 114, temperature 97.9 F (36.6 C), temperature source Oral,  resp. rate 20, last menstrual period 04/07/2012, SpO2 100.00%.  Physical Exam  Constitutional: She is oriented to person, place, and time. She appears well-developed and well-nourished. She appears distressed.  Grimace and on side; no labored breathing, but tense  HENT:  Head: Normocephalic and atraumatic.  Eyes: Pupils are equal, round, and reactive to light.  Cardiovascular: Normal rate.   Respiratory: Effort normal.  GI: Soft.  gravid  Genitourinary:  Cx: 1.5/80/-1, posterior  Neurological: She is alert and oriented to person, place, and time.  Skin: Skin is warm and dry.  Psychiatric: She has a normal mood and affect. Her behavior is normal. Judgment and thought content normal.    MAU Course  Procedures 1. NST: 135, reactive, no decels, moderate variability  TOCO; irregular ctxs, mild, rest palpated 2. Morphine sulfate 10mg  IM x1 & Phenergan 12.5mg  IM x1  Assessment and Plan  1. [redacted]w[redacted]d 2. Threatened labor at term 3. Cat I FHT  1. D/c home w/ labor precautions and FKC; comfort measures rev'd.  Rx given  for Ambien 10mg  po qhs prn insomnia #30 w/ no RF 2. F/u next week at [facility], or prn; support given  Mathayus Stanbery H 12/26/2012, 12:11 AM

## 2013-01-01 ENCOUNTER — Inpatient Hospital Stay (HOSPITAL_COMMUNITY): Payer: Medicaid Other | Admitting: Anesthesiology

## 2013-01-01 ENCOUNTER — Encounter (HOSPITAL_COMMUNITY): Payer: Self-pay | Admitting: *Deleted

## 2013-01-01 ENCOUNTER — Inpatient Hospital Stay (HOSPITAL_COMMUNITY)
Admission: AD | Admit: 2013-01-01 | Discharge: 2013-01-03 | DRG: 775 | Disposition: A | Discharge status: Home / Self Care | Payer: Medicaid Other | Source: Ambulatory Visit | Attending: Obstetrics and Gynecology | Admitting: Obstetrics and Gynecology

## 2013-01-01 ENCOUNTER — Encounter (HOSPITAL_COMMUNITY): Payer: Self-pay | Admitting: Anesthesiology

## 2013-01-01 DIAGNOSIS — O99892 Other specified diseases and conditions complicating childbirth: Principal | ICD-10-CM | POA: Diagnosis present

## 2013-01-01 DIAGNOSIS — O209 Hemorrhage in early pregnancy, unspecified: Secondary | ICD-10-CM

## 2013-01-01 DIAGNOSIS — L309 Dermatitis, unspecified: Secondary | ICD-10-CM

## 2013-01-01 DIAGNOSIS — J45909 Unspecified asthma, uncomplicated: Secondary | ICD-10-CM | POA: Diagnosis present

## 2013-01-01 DIAGNOSIS — IMO0001 Reserved for inherently not codable concepts without codable children: Secondary | ICD-10-CM

## 2013-01-01 LAB — URINALYSIS, MICROSCOPIC ONLY
Bilirubin Urine: NEGATIVE
Glucose, UA: NEGATIVE mg/dL
Ketones, ur: NEGATIVE mg/dL
Leukocytes, UA: NEGATIVE
Nitrite: NEGATIVE
Protein, ur: NEGATIVE mg/dL
Specific Gravity, Urine: <1.005 — ABNORMAL LOW (ref 1.005–1.030)
Urobilinogen, UA: 0.2 mg/dL (ref 0.0–1.0)
pH: 6.5 (ref 5.0–8.0)

## 2013-01-01 LAB — OB RESULTS CONSOLE RUBELLA ANTIBODY, IGM: Rubella: IMMUNE

## 2013-01-01 LAB — CBC
HCT: 33.1 % — ABNORMAL LOW (ref 36.0–46.0)
Hemoglobin: 11.4 g/dL — ABNORMAL LOW (ref 12.0–15.0)
MCH: 29.5 pg (ref 26.0–34.0)
MCHC: 34.4 g/dL (ref 30.0–36.0)
MCV: 85.5 fL (ref 78.0–100.0)
Platelets: 199 10*3/uL (ref 150–400)
RBC: 3.87 MIL/uL (ref 3.87–5.11)
RDW: 15 % (ref 11.5–15.5)
WBC: 17.3 10*3/uL — ABNORMAL HIGH (ref 4.0–10.5)

## 2013-01-01 LAB — OB RESULTS CONSOLE RPR: RPR: NONREACTIVE

## 2013-01-01 LAB — POCT FERN TEST: POCT Fern Test: NEGATIVE

## 2013-01-01 LAB — OB RESULTS CONSOLE ANTIBODY SCREEN: Antibody Screen: NEGATIVE

## 2013-01-01 LAB — OB RESULTS CONSOLE HEPATITIS B SURFACE ANTIGEN: Hepatitis B Surface Ag: NEGATIVE

## 2013-01-01 MED ORDER — PHENYLEPHRINE 40 MCG/ML (10ML) SYRINGE FOR IV PUSH (FOR BLOOD PRESSURE SUPPORT)
80.0000 ug | PREFILLED_SYRINGE | INTRAVENOUS | Status: DC | PRN
  Filled 2013-01-01: qty 5
  Filled 2013-01-01: qty 2

## 2013-01-01 MED ORDER — ACETAMINOPHEN 325 MG PO TABS: 650.0000 mg | ORAL_TABLET | ORAL | Status: DC | PRN

## 2013-01-01 MED ORDER — DIPHENHYDRAMINE HCL 50 MG/ML IJ SOLN: 12.5000 mg | INTRAMUSCULAR | Status: DC | PRN

## 2013-01-01 MED ORDER — SODIUM CHLORIDE 0.9 % IJ SOLN: 3.0000 mL | INTRAMUSCULAR | Status: DC | PRN

## 2013-01-01 MED ORDER — FENTANYL 2.5 MCG/ML BUPIVACAINE 1/10 % EPIDURAL INFUSION (WH - ANES)
12.0000 mL/h | INTRAMUSCULAR | Status: DC | PRN
  Administered 2013-01-01: 14 mL/h via EPIDURAL
  Filled 2013-01-01: qty 125

## 2013-01-01 MED ORDER — LACTATED RINGERS IV SOLN
INTRAVENOUS | Status: DC
  Administered 2013-01-01 (×2): via INTRAVENOUS

## 2013-01-01 MED ORDER — IBUPROFEN 600 MG PO TABS: 600.0000 mg | ORAL_TABLET | Freq: Four times a day (QID) | ORAL | Status: DC | PRN

## 2013-01-01 MED ORDER — ONDANSETRON HCL 4 MG/2ML IJ SOLN
4.0000 mg | Freq: Four times a day (QID) | INTRAMUSCULAR | Status: DC | PRN
  Administered 2013-01-01: 4 mg via INTRAVENOUS
  Filled 2013-01-01: qty 2

## 2013-01-01 MED ORDER — MEASLES, MUMPS & RUBELLA VAC ~~LOC~~ INJ: 0.5000 mL | INJECTION | Freq: Once | SUBCUTANEOUS | Status: DC

## 2013-01-01 MED ORDER — SIMETHICONE 80 MG PO CHEW: 80.0000 mg | CHEWABLE_TABLET | ORAL | Status: DC | PRN

## 2013-01-01 MED ORDER — SODIUM CHLORIDE 0.9 % IJ SOLN: 3.0000 mL | Freq: Two times a day (BID) | INTRAMUSCULAR | Status: DC

## 2013-01-01 MED ORDER — OXYCODONE-ACETAMINOPHEN 5-325 MG PO TABS: 1.0000 | ORAL_TABLET | ORAL | Status: DC | PRN

## 2013-01-01 MED ORDER — BENZOCAINE-MENTHOL 20-0.5 % EX AERO
1.0000 "application " | INHALATION_SPRAY | CUTANEOUS | Status: DC | PRN
  Filled 2013-01-01: qty 56

## 2013-01-01 MED ORDER — ALBUTEROL SULFATE HFA 108 (90 BASE) MCG/ACT IN AERS: 2.0000 | INHALATION_SPRAY | RESPIRATORY_TRACT | Status: DC | PRN

## 2013-01-01 MED ORDER — SENNOSIDES-DOCUSATE SODIUM 8.6-50 MG PO TABS
2.0000 | ORAL_TABLET | Freq: Every day | ORAL | Status: DC
Start: 2013-01-01 — End: 2013-01-03
  Administered 2013-01-01 – 2013-01-02 (×2): 2 via ORAL

## 2013-01-01 MED ORDER — LIDOCAINE HCL (PF) 1 % IJ SOLN
INTRAMUSCULAR | Status: DC | PRN
  Administered 2013-01-01 (×4): 4 mL

## 2013-01-01 MED ORDER — LANOLIN HYDROUS EX OINT: TOPICAL_OINTMENT | CUTANEOUS | Status: DC | PRN

## 2013-01-01 MED ORDER — DIPHENHYDRAMINE HCL 25 MG PO CAPS: 25.0000 mg | ORAL_CAPSULE | Freq: Four times a day (QID) | ORAL | Status: DC | PRN

## 2013-01-01 MED ORDER — METHYLERGONOVINE MALEATE 0.2 MG/ML IJ SOLN: 0.2000 mg | INTRAMUSCULAR | Status: DC | PRN

## 2013-01-01 MED ORDER — ONDANSETRON HCL 4 MG/2ML IJ SOLN: 4.0000 mg | INTRAMUSCULAR | Status: DC | PRN

## 2013-01-01 MED ORDER — IBUPROFEN 600 MG PO TABS
600.0000 mg | ORAL_TABLET | Freq: Four times a day (QID) | ORAL | Status: DC
Start: 2013-01-01 — End: 2013-01-03
  Administered 2013-01-02 – 2013-01-03 (×7): 600 mg via ORAL
  Filled 2013-01-01 (×6): qty 1

## 2013-01-01 MED ORDER — TETANUS-DIPHTH-ACELL PERTUSSIS 5-2.5-18.5 LF-MCG/0.5 IM SUSP
0.5000 mL | Freq: Once | INTRAMUSCULAR | Status: AC
  Administered 2013-01-01: 0.5 mL via INTRAMUSCULAR

## 2013-01-01 MED ORDER — EPHEDRINE 5 MG/ML INJ
10.0000 mg | INTRAVENOUS | Status: DC | PRN
  Filled 2013-01-01: qty 2

## 2013-01-01 MED ORDER — WITCH HAZEL-GLYCERIN EX PADS: 1.0000 | MEDICATED_PAD | CUTANEOUS | Status: DC | PRN

## 2013-01-01 MED ORDER — ONDANSETRON HCL 4 MG PO TABS: 4.0000 mg | ORAL_TABLET | ORAL | Status: DC | PRN

## 2013-01-01 MED ORDER — LEVALBUTEROL HCL 1.25 MG/0.5ML IN NEBU
1.2500 mg | INHALATION_SOLUTION | Freq: Four times a day (QID) | RESPIRATORY_TRACT | Status: DC | PRN
  Administered 2013-01-01: 1.25 mg via RESPIRATORY_TRACT
  Filled 2013-01-01: qty 0.5

## 2013-01-01 MED ORDER — PHENYLEPHRINE 40 MCG/ML (10ML) SYRINGE FOR IV PUSH (FOR BLOOD PRESSURE SUPPORT)
80.0000 ug | PREFILLED_SYRINGE | INTRAVENOUS | Status: DC | PRN
  Filled 2013-01-01: qty 2

## 2013-01-01 MED ORDER — CITRIC ACID-SODIUM CITRATE 334-500 MG/5ML PO SOLN: 30.0000 mL | ORAL | Status: DC | PRN

## 2013-01-01 MED ORDER — SODIUM CHLORIDE 0.9 % IV SOLN: 250.0000 mL | INTRAVENOUS | Status: DC | PRN

## 2013-01-01 MED ORDER — PRENATAL MULTIVITAMIN CH
1.0000 | ORAL_TABLET | Freq: Every day | ORAL | Status: DC
  Administered 2013-01-02 – 2013-01-03 (×2): 1 via ORAL
  Filled 2013-01-01 (×2): qty 1

## 2013-01-01 MED ORDER — ZOLPIDEM TARTRATE 5 MG PO TABS: 5.0000 mg | ORAL_TABLET | Freq: Every evening | ORAL | Status: DC | PRN

## 2013-01-01 MED ORDER — OXYTOCIN 40 UNITS IN LACTATED RINGERS INFUSION - SIMPLE MED
62.5000 mL/h | INTRAVENOUS | Status: DC
  Administered 2013-01-01: 999 mL/h via INTRAVENOUS
  Filled 2013-01-01: qty 1000

## 2013-01-01 MED ORDER — OXYCODONE-ACETAMINOPHEN 5-325 MG PO TABS
1.0000 | ORAL_TABLET | ORAL | Status: DC | PRN
  Administered 2013-01-01 – 2013-01-03 (×4): 1 via ORAL
  Filled 2013-01-01 (×4): qty 1

## 2013-01-01 MED ORDER — METHYLERGONOVINE MALEATE 0.2 MG PO TABS: 0.2000 mg | ORAL_TABLET | ORAL | Status: DC | PRN

## 2013-01-01 MED ORDER — DIBUCAINE 1 % RE OINT: 1.0000 "application " | TOPICAL_OINTMENT | RECTAL | Status: DC | PRN

## 2013-01-01 MED ORDER — MONTELUKAST SODIUM 10 MG PO TABS
10.0000 mg | ORAL_TABLET | Freq: Every day | ORAL | Status: DC
  Administered 2013-01-02 (×2): 10 mg via ORAL
  Filled 2013-01-01 (×2): qty 1

## 2013-01-01 MED ORDER — LACTATED RINGERS IV SOLN: 500.0000 mL | Freq: Once | INTRAVENOUS | Status: DC

## 2013-01-01 MED ORDER — FENTANYL CITRATE 0.05 MG/ML IJ SOLN
100.0000 ug | INTRAMUSCULAR | Status: DC | PRN
  Administered 2013-01-01: 100 ug via INTRAVENOUS
  Filled 2013-01-01: qty 2

## 2013-01-01 MED ORDER — LACTATED RINGERS IV SOLN: 500.0000 mL | INTRAVENOUS | Status: DC | PRN

## 2013-01-01 MED ORDER — LIDOCAINE HCL (PF) 1 % IJ SOLN
30.0000 mL | INTRAMUSCULAR | Status: DC | PRN
  Filled 2013-01-01 (×2): qty 30

## 2013-01-01 MED ORDER — EPHEDRINE 5 MG/ML INJ
10.0000 mg | INTRAVENOUS | Status: DC | PRN
  Filled 2013-01-01: qty 2
  Filled 2013-01-01: qty 4

## 2013-01-01 MED ORDER — OXYTOCIN BOLUS FROM INFUSION: 500.0000 mL | INTRAVENOUS | Status: DC

## 2013-01-01 MED ORDER — MAGNESIUM HYDROXIDE 400 MG/5ML PO SUSP: 30.0000 mL | ORAL | Status: DC | PRN

## 2013-01-01 NOTE — Anesthesia Procedure Notes (Signed)
Epidural Patient location during procedure: OB Start time: 01/01/2013 9:10 AM  Staffing Performed by: anesthesiologist   Preanesthetic Checklist Completed: patient identified, site marked, surgical consent, pre-op evaluation, timeout performed, IV checked, risks and benefits discussed and monitors and equipment checked  Epidural Patient position: sitting Prep: site prepped and draped and DuraPrep Patient monitoring: continuous pulse ox and blood pressure Approach: midline Injection technique: LOR air  Needle:  Needle type: Tuohy  Needle gauge: 17 G Needle length: 9 cm and 9 Needle insertion depth: 7 cm Catheter type: closed end flexible Catheter size: 19 Gauge Catheter at skin depth: 12 cm Test dose: negative  Assessment Events: blood not aspirated, injection not painful, no injection resistance, negative IV test and no paresthesia  Additional Notes Discussed risk of headache, infection, bleeding, nerve injury and failed or incomplete block.  Patient voices understanding and wishes to proceed.  Epidural placed easily on first attempt.  No paresthesia.  Patient tolerated procedure well with no apparent complications.  Jasmine December, mD Reason for block:procedure for pain

## 2013-01-01 NOTE — H&P (Signed)
Lindsey Castillo is a 22 y.o. SB female presenting unannounced at [redacted]w[redacted]d w/ CC of ctxs that woke her up around 0600, and questionable LOF around 1700 yesterday afternoon.  No gushes and no saturation on pad count yesterday.  Denies VB.  Normal FM.  No UTI or PIH s/s.  Used MDI earlier this morning.    Prenatal Course: Pt had an early visit at [facility] and seen in MAU for VB around 10-12 weeks w/ u/s, but no f/u until interview at 17 weeks, and then no f/u after that, until NOB w/u at 24 weeks.  Had anatomy u/s at [facility] on 07/17/12 and WNL.  Had a normal Quad screen at 17 week interview at [facility].  Pt was more consistent w/ appointments during 3rd trimester overall.  Her asthma was more exacerbated during the pregnancy.  Pt seen several times in MAU over last 2 weeks for labor checks and therapeutic rest.    Maternal Medical History:  Reason for admission: Contractions and nausea.   Contractions: Onset was 1-2 hours ago.   Frequency: regular.   Perceived severity is strong.    Fetal activity: Perceived fetal activity is normal.   Last perceived fetal movement was within the past hour.      OB History   Grav Para Term Preterm Abortions TAB SAB Ect Mult Living   1         0     Past Medical History  Diagnosis Date  . Headache   . Seasonal allergies   . Eczema   . Abnormal Pap smear     last pap 07/2012  . Infection     trich  . Infection     BV  . Infection     HX OF FREQUENT  . Asthma     USES INHALER  . Fracture, ankle AGE 46   Past Surgical History  Procedure Laterality Date  . Foot surgery     Family History: family history includes Asthma in her brother; Birth defects in her sister; Cancer in her maternal grandmother; Depression in her mother; Diabetes in her maternal grandmother; Hypertension in her father; Miscarriages / [place] in her cousin; and Thyroid disease in her sister. Social History:  reports that she quit smoking about 7 months ago. Her smoking use included  Cigarettes. She smoked 0.00 packs per day. She has never used smokeless tobacco. She reports that she uses illicit drugs. She reports that she does not drink alcohol.   Prenatal Transfer Tool  Maternal Diabetes: No Genetic Screening: Normal Maternal Ultrasounds/Referrals: Normal Fetal Ultrasounds or other Referrals:  None Maternal Substance Abuse:  No Significant Maternal Medications:  Meds include: Other:  Significant Maternal Lab Results:  Lab values include: Group B Strep negative Other Comments:  Meds: singulair, inhalers, zyrtec prn.  Pt w/ insufficient care 1st and 2nd trimester  Review of Systems  Constitutional: Negative.   HENT: Positive for ear pain.        No headache this am, but some recently.  Lt ear ache  Eyes: Negative.   Respiratory: Negative.   Cardiovascular: Negative.   Gastrointestinal: Positive for nausea.  Genitourinary: Negative.   Neurological: Positive for headaches.    Dilation: 4 Effacement (%): 90 Station: -1 Blood pressure 112/79, pulse 100, temperature 98.7 F (37.1 C), temperature source Oral, resp. rate 20, last menstrual period 04/07/2012, SpO2 100.00%. Maternal Exam:  Uterine Assessment: Contraction strength is moderate.  Contraction frequency is regular.  UC's q 2-3  Abdomen: Patient  reports no abdominal tenderness. Estimated fetal weight is 6-7 lbs.   Fetal presentation: vertex  Introitus: Ferning test: negative.  Nitrazine test: not done. Ring of excoriation around base of vulva and white/opaque overlying glisten to superficial skin; suspect yeast. Vulva are swollen  Pelvis: adequate for delivery.   Cervix: Cervix evaluated by sterile speculum exam and digital exam.   mucousy d/c in vault; negative pooling  Fetal Exam Fetal Monitor Review: Mode: ultrasound.   Variability: moderate (6-25 bpm).   Pattern: accelerations present and no decelerations.    Fetal State Assessment: Category I - tracings are normal.     Physical Exam   Constitutional: She is oriented to person, place, and time. She appears well-developed and well-nourished. She appears distressed.  Writhing w/ ctxs; grimace; calm in between  HENT:  Head: Normocephalic and atraumatic.  Eyes: Pupils are equal, round, and reactive to light.  Cardiovascular: Normal rate.   Respiratory: Effort normal.  GI: Soft.  gravid  Genitourinary:  Cx: 4/90/-1, mid position, soft, stretchy  Musculoskeletal: She exhibits no edema.  Neurological: She is alert and oriented to person, place, and time. She has normal reflexes.  One beat of clonus BLE; check during a ctx however  Skin: Skin is warm and dry.  Psychiatric: She has a normal mood and affect. Her behavior is normal. Judgment and thought content normal.    Prenatal labs: ABO, Rh: --/--/AB POS (10/05 1232) Antibody:   Rubella:   RPR: NON REAC (02/06 1139)  HBsAg:    HIV:    1hr gtt=106 GBS:   negative 12/11/12 Pap neg and gc/ct cx's neg on 09/20/12  Assessment/Plan: 1. [redacted]w[redacted]d 2. Active labor 3. Questionable LOF since 1700 12/31/12--amnisure sent 4. Cat I FHT 5. Desires epidural  1. Admit to [facility] w/ Dr. Normand Sloop as attending 2. Routine L&D orders 3. Epidural ASAP 4. Pitocin/IUPC prn further augmentation 5. C/w MD prn  Cayson Kalb H 01/01/2013, 8:11 AM

## 2013-01-01 NOTE — Anesthesia Preprocedure Evaluation (Signed)
Anesthesia Evaluation  Patient identified by MRN, date of birth, ID band Patient awake    Reviewed: Allergy & Precautions, H&P , NPO status , Patient's Chart, lab work & pertinent test results, reviewed documented beta blocker date and time   History of Anesthesia Complications Negative for: history of anesthetic complications  Airway Mallampati: II TM Distance: >3 FB Neck ROM: full    Dental  (+) Teeth Intact   Pulmonary asthma (daily albuterol use) , former smoker (quit september 2013),  breath sounds clear to auscultation        Cardiovascular negative cardio ROS  Rhythm:regular Rate:Normal     Neuro/Psych  Headaches, negative psych ROS   GI/Hepatic negative GI ROS, Neg liver ROS,   Endo/Other  negative endocrine ROS  Renal/GU negative Renal ROS  negative genitourinary   Musculoskeletal   Abdominal   Peds  Hematology negative hematology ROS (+)   Anesthesia Other Findings   Reproductive/Obstetrics (+) Pregnancy                           Anesthesia Physical Anesthesia Plan  ASA: II  Anesthesia Plan: Epidural   Post-op Pain Management:    Induction:   Airway Management Planned:   Additional Equipment:   Intra-op Plan:   Post-operative Plan:   Informed Consent: I have reviewed the patients History and Physical, chart, labs and discussed the procedure including the risks, benefits and alternatives for the proposed anesthesia with the patient or authorized representative who has indicated his/her understanding and acceptance.     Plan Discussed with:   Anesthesia Plan Comments:         Anesthesia Quick Evaluation

## 2013-01-01 NOTE — Progress Notes (Signed)
Subjective: Pt comfortable s/p epidural.  C/o chest feeling tight.  Used inhaler at home earlier this morning; did not bring it w/ her to hospital.  Pt sitting in [employer] holding O2 mask at face; states it is helping.  Objective: BP 117/73  Pulse 101  Temp(Src) 98.7 F (37.1 C) (Oral)  Resp 18  Ht 5\' 4"  (1.626 m)  Wt 172 lb (78.019 kg)  BMI 29.51 kg/m2  SpO2 100%  LMP 04/07/2012     Pulse ox: 96-100%  FHT:  FHR: 130 bpm, variability: moderate,  accelerations:  Present,  decelerations:  Absent UC:   regular, every 2-4 minutes SVE:   Dilation: 4 Effacement (%): 90 Station: -1 Deferred at present Labs: Lab Results  Component Value Date   WBC 17.3* 01/01/2013   HGB 11.4* 01/01/2013   HCT 33.1* 01/01/2013   MCV 85.5 01/01/2013   PLT 199 01/01/2013    Assessment / Plan: 1. [redacted]w[redacted]d 2. active labor 3. GBS neg 4. asthma   Labor: Progressing normally Preeclampsia:  no signs or symptoms of toxicity Fetal Wellbeing:  Category I Pain Control:  Epidural I/D:  n/a Anticipated MOD:  NSVD 1. Will do neb trx w/ xopenex now, and CTO closely. 2. AROM/Pitocin prn further augmentation 3. C/w MD prn  Johnryan Sao H 01/01/2013, 9:57 AM

## 2013-01-01 NOTE — Progress Notes (Signed)
Subjective: Resting off and on w/o c/o's.  Rt side on my arrival to room.  Several visitors at bs.  No chest tightness or sob at present.  Objective: BP 125/76  Pulse 100  Temp(Src) 97.6 F (36.4 C) (Oral)  Resp 18  Ht 5\' 4"  (1.626 m)  Wt 172 lb (78.019 kg)  BMI 29.51 kg/m2  SpO2 100%  LMP 04/07/2012      FHT:  FHR: 125 bpm, variability: moderate,  accelerations:  Present,  decelerations:  Absent UC:   regular, every 3-4 minutes SVE:   Dilation: 5 Effacement (%): 100 Station: 0 Exam by:: [employer],  AROM mod amt clear fluid Labs: Lab Results  Component Value Date   WBC 17.3* 01/01/2013   HGB 11.4* 01/01/2013   HCT 33.1* 01/01/2013   MCV 85.5 01/01/2013   PLT 199 01/01/2013    Assessment / Plan: 1. [redacted]w[redacted]d 2. spont labor 3. GBS neg 4. asthma attack resolved after neb trx  Labor: latent labor Preeclampsia:  no signs or symptoms of toxicity Fetal Wellbeing:  Category I Pain Control:  Epidural I/D:  n/a Anticipated MOD:  NSVD 1. Will recheck in 2-3 hrs; IUPC/Pitocin prn. 2. C/w MD prn.  Trafton Roker H 01/01/2013, 12:37 PM

## 2013-01-01 NOTE — MAU Note (Signed)
Patient states she is having contractions every 3 minutes. States she had some clear fluid leaking at 0600 with mucus plug. Not actively leaking at this time. Denies bleeding and reports good fetal movement.

## 2013-01-02 LAB — CBC
HCT: 32 % — ABNORMAL LOW (ref 36.0–46.0)
Hemoglobin: 10.9 g/dL — ABNORMAL LOW (ref 12.0–15.0)
MCH: 29.1 pg (ref 26.0–34.0)
MCHC: 34.1 g/dL (ref 30.0–36.0)
MCV: 85.6 fL (ref 78.0–100.0)
Platelets: 195 K/uL (ref 150–400)
RBC: 3.74 MIL/uL — ABNORMAL LOW (ref 3.87–5.11)
RDW: 14.9 % (ref 11.5–15.5)
WBC: 20.7 K/uL — ABNORMAL HIGH (ref 4.0–10.5)

## 2013-01-02 NOTE — Progress Notes (Signed)
UR chart review completed.  

## 2013-01-02 NOTE — Progress Notes (Signed)
Post Partum Day 1 Subjective: no complaints, up ad lib, voiding, tolerating PO, + flatus and VB a little heavier than her normal periods; formula feeding; FOB and his mom are at bs.  newborn is getting hearing screen done at bs currently. no dizziness  Objective: Blood pressure 104/58, pulse 69, temperature 97.8 F (36.6 C), temperature source Oral, resp. rate 17, height 5\' 4"  (1.626 m), weight 172 lb (78.019 kg), last menstrual period 04/07/2012, SpO2 100.00%, unknown if currently breastfeeding.  Physical Exam:  General: alert, cooperative, appears stated age and no distress Lochia: appropriate Uterine Fundus: firm, below umbilicus Incision: n/a DVT Evaluation: No evidence of DVT seen on physical exam. Negative Homan's sign. No significant calf/ankle edema.   Recent Labs  01/01/13 0825 01/02/13 0620  HGB 11.4* 10.9*  HCT 33.1* 32.0*    Assessment/Plan: Plan for discharge tomorrow    LOS: 1 day   Adlai Nieblas H 01/02/2013, 10:48 AM

## 2013-01-02 NOTE — Anesthesia Postprocedure Evaluation (Signed)
  Anesthesia Post-op Note  Patient: Lindsey Castillo  Procedure(s) Performed: * No procedures listed *  Patient Location: Mother/Baby  Anesthesia Type:Epidural  Level of Consciousness: awake, alert  and oriented  Airway and Oxygen Therapy: Patient Spontanous Breathing  Post-op Pain: mild  Post-op Assessment: Patient's Cardiovascular Status Stable and Respiratory Function Stable  Post-op Vital Signs: stable  Complications: No apparent anesthesia complications

## 2013-01-03 MED ORDER — OXYCODONE-ACETAMINOPHEN 5-325 MG PO TABS: 1.0000 | ORAL_TABLET | ORAL | Status: DC | PRN

## 2013-01-03 MED ORDER — IBUPROFEN 600 MG PO TABS: 600.0000 mg | ORAL_TABLET | Freq: Four times a day (QID) | ORAL | Status: DC

## 2013-01-03 NOTE — Clinical Social Work Maternal (Signed)
    Clinical Social Work Department PSYCHOSOCIAL ASSESSMENT - MATERNAL/CHILD 01/03/2013  Patient:  Lindsey Castillo, Lindsey Castillo  Account Number:  [id number]  Admit Date:  01/01/2013  Marjo Bicker Name:   Garald Balding    Clinical Social Worker:  Nobie Putnam, LCSW   Date/Time:  01/03/2013 11:08 AM  Date Referred:  01/03/2013   Referral source  CN     Referred reason  Substance Abuse   Other referral source:    I:  FAMILY / HOME ENVIRONMENT Child's legal guardian:  PARENT  Guardian - Name Guardian - Age Guardian - Address  Callaway Hardigree [address] [place], [place] [address]  Tsosie Billing 20    Other household support members/support persons Name Relationship DOB  Laurette Schimke MOTHER    SISTER    SISTER    Other support:    II  PSYCHOSOCIAL DATA Information Source:  Patient Interview  Event organiser Employment:   Financial resources:  Medicaid If Medicaid - County:  [place] Other  [facility]   School / Grade:   Maternity Care Coordinator / Child Services Coordination / Early Interventions:  Cultural issues impacting care:    III  STRENGTHS Strengths  Adequate Resources  Home prepared for Child (including basic supplies)  Supportive family/friends   Strength comment:    IV  RISK FACTORS AND CURRENT PROBLEMS Current Problem:  YES   Risk Factor & Current Problem Patient Issue Family Issue Risk Factor / Current Problem Comment  Substance Abuse Y N Etoh early in pregnancy   N N     V  SOCIAL WORK ASSESSMENT CSW referral received to assess pt's history of Etoh use early in pregnancy & "possible illicit drug use."  Pt admits that she drank Etoh prior to pregnancy confirmation. Once she learned of pregnancy, she stopped drinking.  UDS is positive for opiates.  Towards the end of the pregnancy, pt told CSW that she came to MAU several times for pain. She was given an injection of Morphine during one visit & Vicodin on the other.  Pt told CSW that she was tired of  coming to the hospital so she took 1 of her mother's Tylenol 3 pills right before delivery.  She denies any regular use of opiates during the pregnancy.  Pt denies any illegal substance use.  CSW reviewed pt's chart & did not find documentation of illegal substance use history.  Pt appears to be appropriate & bonding well with the infant. FOB is at the bedside & supportive, as per pt.  Pt has all the necessary supplies for the infant.  CSW will monitor drug screen results & report if necessary.      VI SOCIAL WORK PLAN  Type of pt/family education:   If child protective services report - county:   If child protective services report - date:   Information/referral to community resources comment:   Other social work plan:

## 2013-01-03 NOTE — Discharge Summary (Signed)
  Vaginal Delivery Discharge Summary  Lindsey Castillo  DOB:    September 11, 1991 MRN:    [id number] CSN:    [id number]  Date of admission:                  01/01/13  Date of discharge:                   01/03/13  Procedures this admission: SVD  Date of Delivery: 01/01/13 by French Ana  Newborn Data:  Live born female  Birth Weight: 6 lb 2.8 oz (2800 g) APGAR: 9, 9  Home with mother. Name: "Lindsey Castillo" Circumcision Plan: Outpatient  History of Present Illness:  Ms. Lindsey Castillo is a 22 y.o. female, G1P1001, who presents at [redacted]w[redacted]d weeks gestation. The patient has been followed at the [facility] Gy[facility]cology division of [facility]. She was admitted onset of labor. Her pregnancy has been complicated by:  Patient Active Problem List  Diagnosis  . Asthma  . Eczema  . NSVD (normal spontaneous vaginal delivery)    Hospital course:  The patient was admitted for labor.   Her labor was not complicated. She proceeded to have a vaginal delivery of a healthy infant. Her delivery was not complicated. Her postpartum course was not complicated.  She was discharged to home on postpartum day 2 doing well.  Feeding:  bottle  Contraception:  Depo-Provera, at hospital then Nexplanon  Discharge hemoglobin:  Hemoglobin  Date Value Range Status  01/02/2013 10.9* 12.0 - 15.0 g/dL Final     HCT  Date Value Range Status  01/02/2013 32.0* 36.0 - 46.0 % Final    Discharge Physical Exam:   General: alert, cooperative and no distress Lochia: appropriate Uterine Fundus: firm Incision: n/a DVT Evaluation: No evidence of DVT seen on physical exam.  Intrapartum Procedures: spontaneous vaginal delivery Postpartum Procedures: none Complications-Operative and Postpartum: none  Discharge Diagnoses: Term Pregnancy-delivered, mild asymptomatic anemia  Discharge Information:  Activity:           per [facility] handbook Diet:                routine Medications: PNV,  Ibuprofen and Percocet per pt request Condition:      stable Instructions:  refer to practice specific booklet Discharge to: home  Follow-up Information   Follow up with [facility] In 5 weeks. (Call with any questions or concerns)    Contact information:   [address] [place] [address] [phone]       Haroldine Laws 01/03/2013

## 2014-05-13 ENCOUNTER — Other Ambulatory Visit (HOSPITAL_COMMUNITY)
Admission: RE | Admit: 2014-05-13 | Discharge: 2014-05-13 | Disposition: A | Discharge status: Home / Self Care | Payer: Self-pay | Source: Ambulatory Visit | Attending: Family Medicine | Admitting: Family Medicine

## 2014-05-13 ENCOUNTER — Emergency Department (INDEPENDENT_AMBULATORY_CARE_PROVIDER_SITE_OTHER)
Admission: EM | Admit: 2014-05-13 | Discharge: 2014-05-13 | Disposition: A | Discharge status: Home / Self Care | Payer: Self-pay | Source: Home / Self Care | Attending: Family Medicine | Admitting: Family Medicine

## 2014-05-13 ENCOUNTER — Encounter (HOSPITAL_COMMUNITY): Payer: Self-pay | Admitting: Emergency Medicine

## 2014-05-13 DIAGNOSIS — Z113 Encounter for screening for infections with a predominantly sexual mode of transmission: Secondary | ICD-10-CM | POA: Insufficient documentation

## 2014-05-13 DIAGNOSIS — N760 Acute vaginitis: Secondary | ICD-10-CM | POA: Insufficient documentation

## 2014-05-13 LAB — POCT PREGNANCY, URINE: Preg Test, Ur: NEGATIVE

## 2014-05-13 MED ORDER — FLUCONAZOLE 150 MG PO TABS: 150.0000 mg | ORAL_TABLET | Freq: Every day | ORAL | Status: DC

## 2014-05-13 NOTE — Discharge Instructions (Signed)
May also use over the counter Monistat cream for external vaginal itching and irritation Vaginitis Vaginitis is an inflammation of the vagina. It is most often caused by a change in the normal balance of the bacteria and yeast that live in the vagina. This change in balance causes an overgrowth of certain bacteria or yeast, which causes the inflammation. There are different types of vaginitis, but the most common types are:  Bacterial vaginosis.  Yeast infection (candidiasis).  Trichomoniasis vaginitis. This is a sexually transmitted infection (STI).  Viral vaginitis.  Atropic vaginitis.  Allergic vaginitis. CAUSES  The cause depends on the type of vaginitis. Vaginitis can be caused by:  Bacteria (bacterial vaginosis).  Yeast (yeast infection).  A parasite (trichomoniasis vaginitis)  A virus (viral vaginitis).  Low hormone levels (atrophic vaginitis). Low hormone levels can occur during pregnancy, breastfeeding, or after menopause.  Irritants, such as bubble baths, scented tampons, and feminine sprays (allergic vaginitis). Other factors can change the normal balance of the yeast and bacteria that live in the vagina. These include:  Antibiotic medicines.  Poor hygiene.  Diaphragms, vaginal sponges, spermicides, birth control pills, and intrauterine devices (IUD).  Sexual intercourse.  Infection.  Uncontrolled diabetes.  A weakened immune system. SYMPTOMS  Symptoms can vary depending on the cause of the vaginitis. Common symptoms include:  Abnormal vaginal discharge.  The discharge is white, gray, or yellow with bacterial vaginosis.  The discharge is thick, white, and cheesy with a yeast infection.  The discharge is frothy and yellow or greenish with trichomoniasis.  A bad vaginal odor.  The odor is fishy with bacterial vaginosis.  Vaginal itching, pain, or swelling.  Painful intercourse.  Pain or burning when urinating. Sometimes, there are no  symptoms. TREATMENT  Treatment will vary depending on the type of infection.   Bacterial vaginosis and trichomoniasis are often treated with antibiotic creams or pills.  Yeast infections are often treated with antifungal medicines, such as vaginal creams or suppositories.  Viral vaginitis has no cure, but symptoms can be treated with medicines that relieve discomfort. Your sexual partner should be treated as well.  Atrophic vaginitis may be treated with an estrogen cream, pill, suppository, or vaginal ring. If vaginal dryness occurs, lubricants and moisturizing creams may help. You may be told to avoid scented soaps, sprays, or douches.  Allergic vaginitis treatment involves quitting the use of the product that is causing the problem. Vaginal creams can be used to treat the symptoms. HOME CARE INSTRUCTIONS   Take all medicines as directed by your caregiver.  Keep your genital area clean and dry. Avoid soap and only rinse the area with water.  Avoid douching. It can remove the healthy bacteria in the vagina.  Do not use tampons or have sexual intercourse until your vaginitis has been treated. Use sanitary pads while you have vaginitis.  Wipe from front to back. This avoids the spread of bacteria from the rectum to the vagina.  Let air reach your genital area.  Wear cotton underwear to decrease moisture buildup.  Avoid wearing underwear while you sleep until your vaginitis is gone.  Avoid tight pants and underwear or nylons without a cotton panel.  Take off wet clothing (especially bathing suits) as soon as possible.  Use mild, non-scented products. Avoid using irritants, such as:  Scented feminine sprays.  Fabric softeners.  Scented detergents.  Scented tampons.  Scented soaps or bubble baths.  Practice safe sex and use condoms. Condoms may prevent the spread of trichomoniasis and  viral vaginitis. SEEK MEDICAL CARE IF:   You have abdominal pain.  You have a fever or  persistent symptoms for more than 2-3 days.  You have a fever and your symptoms suddenly get worse. Document Released: 06/26/2007 Document Revised: 05/23/2012 Document Reviewed: 02/09/2012 [facility] Patient Information 2015 [place], [place]. This information is not intended to replace advice given to you by your health care provider. Make sure you discuss any questions you have with your health care provider.

## 2014-05-13 NOTE — ED Provider Notes (Signed)
Medical screening examination/treatment/procedure(s) were performed by a resident physician or non-physician practitioner and as the supervising physician I was immediately available for consultation/collaboration.  Clementeen Graham, MD    Rodolph Bong, MD 05/13/14 [phone]

## 2014-05-13 NOTE — ED Notes (Signed)
Call back number verified.  

## 2014-05-13 NOTE — ED Provider Notes (Signed)
CSN: [id number]     Arrival date & time 05/13/14  1205 History   First MD Initiated Contact with Patient 05/13/14 1217     Chief Complaint  Patient presents with  . Vaginal Discharge   (Consider location/radiation/quality/duration/timing/severity/associated sxs/prior Treatment) HPI Comments: LNMP: Apr 14, 2014 No vaginal bleeding Reports moderate vaginal itching Denies fever, dysuria, hematuria  Patient is a 23 y.o. female presenting with vaginal discharge. The history is provided by the patient.  Vaginal Discharge Quality:  White Severity:  Moderate Onset quality:  Gradual Duration:  1 day Timing:  Constant Progression:  Worsening Chronicity:  New Associated symptoms: rash and vaginal itching   Associated symptoms: no abdominal pain, no dyspareunia, no dysuria, no fever, no genital lesions, no nausea, no urinary frequency, no urinary hesitancy, no urinary incontinence and no vomiting   Risk factors: no STI exposure and no unprotected sex     Past Medical History  Diagnosis Date  . Headache(784.0)   . Seasonal allergies   . Eczema   . Abnormal Pap smear     last pap 07/2012  . Infection     trich  . Infection     BV  . Infection     HX OF FREQUENT  . Asthma     USES INHALER  . Fracture, ankle AGE 1  . NSVD (normal spontaneous vaginal delivery) 01/01/2013    SVD at 1525   Past Surgical History  Procedure Laterality Date  . Foot surgery     Family History  Problem Relation Age of Onset  . Hypertension Father   . Cancer Maternal Grandmother   . Diabetes Maternal Grandmother   . Depression Mother   . Birth defects Sister     HEART MURMUR  . Asthma Brother   . Miscarriages / [place] Cousin   . Thyroid disease Sister    History  Substance Use Topics  . Smoking status: Former Smoker    Types: Cigarettes    Quit date: 05/24/2012  . Smokeless tobacco: Never Used  . Alcohol Use: No     Comment: weekly; LAST DRANK 05/16/12   OB History   Grav Para Term  Preterm Abortions TAB SAB Ect Mult Living   Review of Systems  Constitutional: Negative for fever.  Gastrointestinal: Negative for nausea, vomiting and abdominal pain.  Genitourinary: Positive for vaginal discharge. Negative for bladder incontinence, dysuria, hesitancy and dyspareunia.  All other systems reviewed and are negative.   Allergies  Other  Home Medications   Prior to Admission medications   Medication Sig Start Date End Date Taking? Authorizing Provider  albuterol (PROVENTIL HFA;VENTOLIN HFA) 108 (90 BASE) MCG/ACT inhaler Inhale 2 puffs into the lungs 2 (two) times daily as needed. For asthma 10/18/12   Esmeralda Arthur, MD  albuterol (PROVENTIL) (5 MG/ML) 0.5% nebulizer solution Take 0.5 mLs (2.5 mg total) by nebulization every 6 (six) hours as needed. 10/18/12   Esmeralda Arthur, MD  fluconazole (DIFLUCAN) 150 MG tablet Take 1 tablet (150 mg total) by mouth daily. X 1 dose 05/13/14   Mathis Fare Kowen Kluth, PA  ibuprofen (ADVIL,MOTRIN) 600 MG tablet Take 1 tablet (600 mg total) by mouth every 6 (six) hours. 01/03/13   Haroldine Laws, CNM  montelukast (SINGULAIR) 10 MG tablet Take 1 tablet (10 mg total) by mouth at bedtime. 10/18/12   Esmeralda Arthur, MD  oxyCODONE-acetaminophen (PERCOCET/ROXICET) 5-325 MG per tablet Take  1-2 tablets by mouth every 4 (four) hours as needed. 01/03/13   Haroldine Laws, CNM  Prenatal Vit-Fe Fumarate-FA (PRENATAL MULTIVITAMIN) TABS Take 1 tablet by mouth daily.    Historical Provider, MD  triamcinolone cream (KENALOG) 0.1 % Apply 1 application topically 3 (three) times daily as needed. For eczema    Historical Provider, MD   BP 134/88  Pulse 107  Temp(Src) 97.2 F (36.2 C) (Oral)  Resp 18  SpO2 96%  LMP 04/14/2014  Breastfeeding? No Physical Exam  Nursing note and vitals reviewed. Constitutional: She is oriented to person, place, and time. She appears well-developed and well-nourished. No distress.  HENT:  Head: Normocephalic and  atraumatic.  Cardiovascular: Normal rate.   Pulmonary/Chest: Effort normal.  Genitourinary: Uterus normal.    Pelvic exam was performed with patient supine. Cervix exhibits no motion tenderness, no discharge and no friability. Right adnexum displays no mass, no tenderness and no fullness. Left adnexum displays no mass, no tenderness and no fullness. There is erythema around the vagina. No tenderness or bleeding around the vagina. No foreign body around the vagina. No signs of injury around the vagina. Vaginal discharge found.  Thick white discharge  Neurological: She is alert and oriented to person, place, and time.  Skin: Skin is warm and dry.  Psychiatric: She has a normal mood and affect. Her behavior is normal.    ED Course  Procedures (including critical care time) Labs Review Labs Reviewed  CERVICOVAGINAL ANCILLARY ONLY    Imaging Review No results found.   MDM   1. Vaginitis    Exam suggestive of yeast vaginitis.  Will treat with diflucan  po x 1 dose and advise using topical, external Monistat for itching and redness around external vaginal area.    Ria Clock, [place] 05/13/14 1251

## 2014-05-13 NOTE — ED Notes (Addendum)
Affirm: Candida pos., Gardnerella and Trich neg., Pt. adequately treated with Diflucan. Vassie Moselle 05/13/2014 GC/Chlamydia neg. 05/14/2014

## 2014-05-13 NOTE — ED Notes (Signed)
C/o white vag d/c and irritation onset yest am Reports she used new soap and is sure that's what's causing irritation Denies abd pain, f/v/n/d Alert, no signs of acute distress.

## 2014-05-14 LAB — POCT URINALYSIS DIP (DEVICE)
BILIRUBIN URINE: NEGATIVE
Glucose, UA: NEGATIVE mg/dL
Ketones, ur: NEGATIVE mg/dL
Nitrite: NEGATIVE
Protein, ur: 100 mg/dL — AB
Specific Gravity, Urine: 1.015 (ref 1.005–1.030)
Urobilinogen, UA: 0.2 mg/dL (ref 0.0–1.0)
pH: 6.5 (ref 5.0–8.0)

## 2014-07-14 ENCOUNTER — Encounter (HOSPITAL_COMMUNITY): Payer: Self-pay | Admitting: Emergency Medicine

## 2014-12-03 ENCOUNTER — Emergency Department (INDEPENDENT_AMBULATORY_CARE_PROVIDER_SITE_OTHER)
Admission: EM | Admit: 2014-12-03 | Discharge: 2014-12-03 | Disposition: A | Discharge status: Home / Self Care | Payer: Self-pay | Source: Home / Self Care | Attending: Family Medicine | Admitting: Family Medicine

## 2014-12-03 ENCOUNTER — Encounter (HOSPITAL_COMMUNITY): Payer: Self-pay | Admitting: Emergency Medicine

## 2014-12-03 DIAGNOSIS — N898 Other specified noninflammatory disorders of vagina: Secondary | ICD-10-CM

## 2014-12-03 LAB — POCT URINALYSIS DIP (DEVICE)
Bilirubin Urine: NEGATIVE
Glucose, UA: NEGATIVE mg/dL
Ketones, ur: NEGATIVE mg/dL
LEUKOCYTES UA: NEGATIVE
Nitrite: NEGATIVE
PROTEIN: NEGATIVE mg/dL
Specific Gravity, Urine: 1.015 (ref 1.005–1.030)
Urobilinogen, UA: 0.2 mg/dL (ref 0.0–1.0)
pH: 7 (ref 5.0–8.0)

## 2014-12-03 LAB — POCT PREGNANCY, URINE: PREG TEST UR: NEGATIVE

## 2014-12-03 NOTE — ED Provider Notes (Signed)
CSN: [id number]     Arrival date & time 12/03/14  1850 History   First MD Initiated Contact with Patient 12/03/14 2004     Chief Complaint  Patient presents with  . Vaginal Discharge   (Consider location/radiation/quality/duration/timing/severity/associated sxs/prior Treatment) HPI Comments: Lindsey Castillo presents with 1 episode of increased vaginal discharge and mild cramping. Discharge was clear and without odor. She is wondering if she is pregnant. Not actively trying to conceive. She denies abdominal pain, pelvic pain, malodorous discharge, vaginal lesions or urinary symptoms.   Patient is a 24 y.o. female presenting with vaginal discharge. The history is provided by the patient.  Vaginal Discharge   Past Medical History  Diagnosis Date  . Headache(784.0)   . Seasonal allergies   . Eczema   . Abnormal Pap smear     last pap 07/2012  . Infection     trich  . Infection     BV  . Infection     HX OF FREQUENT  . Asthma     USES INHALER  . Fracture, ankle AGE 25  . NSVD (normal spontaneous vaginal delivery) 01/01/2013    SVD at 1525   Past Surgical History  Procedure Laterality Date  . Foot surgery     Family History  Problem Relation Age of Onset  . Hypertension Father   . Cancer Maternal Grandmother   . Diabetes Maternal Grandmother   . Depression Mother   . Birth defects Sister     HEART MURMUR  . Asthma Brother   . Miscarriages / [place] Cousin   . Thyroid disease Sister    History  Substance Use Topics  . Smoking status: Former Smoker    Types: Cigarettes    Quit date: 05/24/2012  . Smokeless tobacco: Never Used  . Alcohol Use: No     Comment: weekly; LAST DRANK 05/16/12   OB History    Gravida Para Term Preterm AB TAB SAB Ectopic Multiple Living   Review of Systems  Genitourinary: Positive for vaginal discharge.  All other systems reviewed and are negative.   Allergies  Other  Home Medications   Prior to Admission medications    Medication Sig Start Date End Date Taking? Authorizing Provider  albuterol (PROVENTIL HFA;VENTOLIN HFA) 108 (90 BASE) MCG/ACT inhaler Inhale 2 puffs into the lungs 2 (two) times daily as needed. For asthma 10/18/12   Silverio Lay, MD  albuterol (PROVENTIL) (5 MG/ML) 0.5% nebulizer solution Take 0.5 mLs (2.5 mg total) by nebulization every 6 (six) hours as needed. 10/18/12   Silverio Lay, MD  fluconazole (DIFLUCAN) 150 MG tablet Take 1 tablet (150 mg total) by mouth daily. X 1 dose 05/13/14   Mathis Fare Presson, PA  ibuprofen (ADVIL,MOTRIN) 600 MG tablet Take 1 tablet (600 mg total) by mouth every 6 (six) hours. 01/03/13   Haroldine Laws, CNM  montelukast (SINGULAIR) 10 MG tablet Take 1 tablet (10 mg total) by mouth at bedtime. 10/18/12   Silverio Lay, MD  oxyCODONE-acetaminophen (PERCOCET/ROXICET) 5-325 MG per tablet Take 1-2 tablets by mouth every 4 (four) hours as needed. 01/03/13   Haroldine Laws, CNM  Prenatal Vit-Fe Fumarate-FA (PRENATAL MULTIVITAMIN) TABS Take 1 tablet by mouth daily.    Historical Provider, MD  triamcinolone cream (KENALOG) 0.1 % Apply 1 application topically 3 (three) times daily as needed. For eczema    Historical Provider, MD   BP 110/72 mmHg  Pulse 84  Temp(Src) 98.5 F (36.9 C) (Oral)  Resp 16  SpO2 99%  LMP 11/09/2014  Breastfeeding? No Physical Exam  Constitutional: She is oriented to person, place, and time. She appears well-developed and well-nourished.  Abdominal: Soft. Bowel sounds are normal. She exhibits no distension. There is no tenderness. There is no rebound and no guarding.  Neurological: She is alert and oriented to person, place, and time.  Skin: Skin is warm and dry.  Psychiatric: Her behavior is normal.  Nursing note and vitals reviewed.   ED Course  Procedures (including critical care time) Labs Review Labs Reviewed  POCT URINALYSIS DIP (DEVICE) - Abnormal; Notable for the following:    Hgb urine dipstick TRACE (*)    All other components  within normal limits  POCT PREGNANCY, URINE    Imaging Review No results found.   MDM   1. Vaginal discharge    Normal without signs or symptoms of STD or infection. Does not wish to have a pelvic or STD panel. Not pregnant. Follow for now. Reassurance given.     Riki SheerMichelle G Yolander Goodie, PA-C 12/03/14 2112

## 2014-12-03 NOTE — Discharge Instructions (Signed)
No emergent concerns at this time. No symptoms or worries re: STDs. Follow for now.

## 2014-12-03 NOTE — ED Notes (Signed)
C/o intermittent vag d/c and abd pain onset 1 week Sx also include urinary freq Denies fevers, chills Alert, no signs of acute distress.

## 2014-12-06 ENCOUNTER — Emergency Department (HOSPITAL_COMMUNITY)
Admission: EM | Admit: 2014-12-06 | Discharge: 2014-12-06 | Disposition: A | Discharge status: Home / Self Care | Payer: Self-pay | Attending: Emergency Medicine | Admitting: Emergency Medicine

## 2014-12-06 ENCOUNTER — Emergency Department (HOSPITAL_COMMUNITY): Payer: Medicaid Other

## 2014-12-06 ENCOUNTER — Encounter (HOSPITAL_COMMUNITY): Payer: Self-pay | Admitting: Emergency Medicine

## 2014-12-06 DIAGNOSIS — R112 Nausea with vomiting, unspecified: Secondary | ICD-10-CM

## 2014-12-06 DIAGNOSIS — K2920 Alcoholic gastritis without bleeding: Secondary | ICD-10-CM | POA: Insufficient documentation

## 2014-12-06 DIAGNOSIS — F1010 Alcohol abuse, uncomplicated: Secondary | ICD-10-CM

## 2014-12-06 DIAGNOSIS — Z872 Personal history of diseases of the skin and subcutaneous tissue: Secondary | ICD-10-CM | POA: Insufficient documentation

## 2014-12-06 DIAGNOSIS — R1013 Epigastric pain: Secondary | ICD-10-CM

## 2014-12-06 DIAGNOSIS — Y905 Blood alcohol level of 100-119 mg/100 ml: Secondary | ICD-10-CM | POA: Insufficient documentation

## 2014-12-06 DIAGNOSIS — Z8619 Personal history of other infectious and parasitic diseases: Secondary | ICD-10-CM | POA: Insufficient documentation

## 2014-12-06 DIAGNOSIS — Z79899 Other long term (current) drug therapy: Secondary | ICD-10-CM | POA: Insufficient documentation

## 2014-12-06 DIAGNOSIS — Z720 Tobacco use: Secondary | ICD-10-CM | POA: Insufficient documentation

## 2014-12-06 DIAGNOSIS — Z8742 Personal history of other diseases of the female genital tract: Secondary | ICD-10-CM | POA: Insufficient documentation

## 2014-12-06 DIAGNOSIS — J45909 Unspecified asthma, uncomplicated: Secondary | ICD-10-CM | POA: Insufficient documentation

## 2014-12-06 DIAGNOSIS — F10129 Alcohol abuse with intoxication, unspecified: Secondary | ICD-10-CM | POA: Insufficient documentation

## 2014-12-06 DIAGNOSIS — Z3202 Encounter for pregnancy test, result negative: Secondary | ICD-10-CM | POA: Insufficient documentation

## 2014-12-06 DIAGNOSIS — Z8781 Personal history of (healed) traumatic fracture: Secondary | ICD-10-CM | POA: Insufficient documentation

## 2014-12-06 LAB — POC URINE PREG, ED: Preg Test, Ur: NEGATIVE

## 2014-12-06 LAB — CBC WITH DIFFERENTIAL/PLATELET
Basophils Absolute: 0 10*3/uL (ref 0.0–0.1)
Basophils Relative: 0 % (ref 0–1)
Eosinophils Absolute: 0.1 10*3/uL (ref 0.0–0.7)
Eosinophils Relative: 1 % (ref 0–5)
HEMATOCRIT: 33 % — AB (ref 36.0–46.0)
HEMOGLOBIN: 11.2 g/dL — AB (ref 12.0–15.0)
LYMPHS ABS: 2.6 10*3/uL (ref 0.7–4.0)
Lymphocytes Relative: 31 % (ref 12–46)
MCH: 27.9 pg (ref 26.0–34.0)
MCHC: 33.9 g/dL (ref 30.0–36.0)
MCV: 82.3 fL (ref 78.0–100.0)
MONO ABS: 0.7 10*3/uL (ref 0.1–1.0)
Monocytes Relative: 8 % (ref 3–12)
Neutro Abs: 4.9 10*3/uL (ref 1.7–7.7)
Neutrophils Relative %: 60 % (ref 43–77)
PLATELETS: 260 10*3/uL (ref 150–400)
RBC: 4.01 MIL/uL (ref 3.87–5.11)
RDW: 16.4 % — ABNORMAL HIGH (ref 11.5–15.5)
WBC: 8.3 10*3/uL (ref 4.0–10.5)

## 2014-12-06 LAB — COMPREHENSIVE METABOLIC PANEL
ALBUMIN: 3.9 g/dL (ref 3.5–5.2)
ALT: 18 U/L (ref 0–35)
ANION GAP: 7 (ref 5–15)
AST: 21 U/L (ref 0–37)
Alkaline Phosphatase: 57 U/L (ref 39–117)
BILIRUBIN TOTAL: 0.5 mg/dL (ref 0.3–1.2)
BUN: 7 mg/dL (ref 6–23)
CHLORIDE: 113 mmol/L — AB (ref 96–112)
CO2: 23 mmol/L (ref 19–32)
CREATININE: 0.91 mg/dL (ref 0.50–1.10)
Calcium: 8.8 mg/dL (ref 8.4–10.5)
GFR calc Af Amer: >90 mL/min (ref >90)
GFR calc non Af Amer: 88 mL/min — ABNORMAL LOW (ref >90)
GLUCOSE: 79 mg/dL (ref 70–99)
POTASSIUM: 3.9 mmol/L (ref 3.5–5.1)
SODIUM: 143 mmol/L (ref 135–145)
Total Protein: 7.2 g/dL (ref 6.0–8.3)

## 2014-12-06 LAB — URINALYSIS, ROUTINE W REFLEX MICROSCOPIC
BILIRUBIN URINE: NEGATIVE
Glucose, UA: NEGATIVE mg/dL
Hgb urine dipstick: NEGATIVE
Ketones, ur: NEGATIVE mg/dL
LEUKOCYTES UA: NEGATIVE
NITRITE: NEGATIVE
Protein, ur: NEGATIVE mg/dL
SPECIFIC GRAVITY, URINE: 1.013 (ref 1.005–1.030)
Urobilinogen, UA: 1 mg/dL (ref 0.0–1.0)
pH: 6 (ref 5.0–8.0)

## 2014-12-06 LAB — LIPASE, BLOOD: LIPASE: 28 U/L (ref 11–59)

## 2014-12-06 LAB — RAPID URINE DRUG SCREEN, HOSP PERFORMED
Amphetamines: NOT DETECTED
Barbiturates: NOT DETECTED
Benzodiazepines: NOT DETECTED
Cocaine: NOT DETECTED
Opiates: NOT DETECTED
Tetrahydrocannabinol: NOT DETECTED

## 2014-12-06 LAB — ETHANOL: ALCOHOL ETHYL (B): 105 mg/dL — AB (ref 0–9)

## 2014-12-06 MED ORDER — PANTOPRAZOLE SODIUM 40 MG IV SOLR
40.0000 mg | Freq: Once | INTRAVENOUS | Status: AC
  Administered 2014-12-06: 40 mg via INTRAVENOUS
  Filled 2014-12-06: qty 40

## 2014-12-06 MED ORDER — SODIUM CHLORIDE 0.9 % IV BOLUS (SEPSIS)
1000.0000 mL | Freq: Once | INTRAVENOUS | Status: AC
  Administered 2014-12-06: 1000 mL via INTRAVENOUS

## 2014-12-06 MED ORDER — MORPHINE SULFATE 4 MG/ML IJ SOLN: 4.0000 mg | Freq: Once | INTRAMUSCULAR | Status: DC

## 2014-12-06 MED ORDER — ONDANSETRON HCL 4 MG/2ML IJ SOLN
4.0000 mg | Freq: Once | INTRAMUSCULAR | Status: AC
  Administered 2014-12-06: 4 mg via INTRAVENOUS
  Filled 2014-12-06: qty 2

## 2014-12-06 MED ORDER — OMEPRAZOLE 20 MG PO CPDR: 20.0000 mg | DELAYED_RELEASE_CAPSULE | Freq: Every day | ORAL | Status: DC

## 2014-12-06 MED ORDER — ONDANSETRON HCL 8 MG PO TABS: 8.0000 mg | ORAL_TABLET | Freq: Three times a day (TID) | ORAL | Status: DC | PRN

## 2014-12-06 NOTE — ED Notes (Signed)
Pt reports that she went out last night and drank alcohol. Pt c/o pain in abdominal with N/V. Pt unable to tolerate PO intake.

## 2014-12-06 NOTE — ED Provider Notes (Signed)
CSN: [id number]     Arrival date & time 12/06/14  1002 History   First MD Initiated Contact with Patient 12/06/14 1020     Chief Complaint  Patient presents with  . Abdominal Pain  . Emesis     (Consider location/radiation/quality/duration/timing/severity/associated sxs/prior Treatment) HPI Comments: Lindsey Castillo is a 24 y.o. female with a PMHx of asthma, remote BV and trichomonas, seasonal allergies, eczema, and chronic headaches, who presents to the ED with complaints of worsening of abdominal pain that initially began out 4 days ago, but worsened after a night of consuming a heavy amount of alcohol. She states that she had 10 shots of Hennessy and 1 Bud Light taking her last sip at 2 AM, and at 4 AM she developed 6/10 periumbilical/epigastric intermittent cramping, with no known aggravating factors, nonradiating, with mild improvement with ibuprofen 400 mg that she had been taking for the abd pain that she had been experiencing for several days prior to today's visit. LEVEL 5 CAVEAT: pt is still slightly intoxicated, and therefore history is somewhat difficult to obtain.   She reports 10 episodes of nonbloody nonbilious emesis consisting of stomach acids. She also reports 3 episodes of looser stool, without melena or hematochezia, but denies any watery diarrhea. Denies any fevers, chills, chest pain shortness breath, wheezing, constipation, obstipation, dysuria, hematuria, vaginal bleeding or discharge, numbness, tingling, weakness. Last menstrual was February 2016. Chart review does reveal that she had a negative pregnancy test 3 days ago. Denies sick contacts or recent travel.   Patient is a 24 y.o. female presenting with abdominal pain and vomiting. The history is provided by the patient. No language interpreter was used.  Abdominal Pain Pain location:  Epigastric and periumbilical Pain quality: cramping   Pain radiates to:  Does not radiate Pain severity:  Moderate Onset quality:   Gradual Duration:  6 hours (although some pain x4 days) Timing:  Intermittent Progression:  Unchanged Chronicity:  Recurrent Context: alcohol use   Relieved by:  NSAIDs Worsened by:  Nothing tried Ineffective treatments:  None tried Associated symptoms: vomiting   Associated symptoms: no chest pain, no chills, no constipation, no diarrhea, no dysuria, no fever, no flatus, no hematemesis, no hematochezia, no hematuria, no melena, no nausea, no shortness of breath, no vaginal bleeding and no vaginal discharge   Risk factors: alcohol abuse and NSAID use   Emesis Severity:  Moderate Duration:  6 hours Timing:  Constant Number of daily episodes:  10x Quality:  Stomach contents Progression:  Unchanged Chronicity:  New Recent urination:  Normal Relieved by:  None tried Worsened by:  Nothing tried Ineffective treatments:  None tried Associated symptoms: abdominal pain   Associated symptoms: no arthralgias, no chills, no cough, no diarrhea, no fever, no myalgias and no URI   Risk factors: alcohol use   Risk factors: no sick contacts, no suspect food intake and no travel to endemic areas     Past Medical History  Diagnosis Date  . Headache(784.0)   . Seasonal allergies   . Eczema   . Abnormal Pap smear     last pap 07/2012  . Infection     trich  . Infection     BV  . Infection     HX OF FREQUENT  . Asthma     USES INHALER  . Fracture, ankle AGE 82  . NSVD (normal spontaneous vaginal delivery) 01/01/2013    SVD at 1525   Past Surgical History  Procedure Laterality Date  .  Foot surgery     Family History  Problem Relation Age of Onset  . Hypertension Father   . Cancer Maternal Grandmother   . Diabetes Maternal Grandmother   . Depression Mother   . Birth defects Sister     HEART MURMUR  . Asthma Brother   . Miscarriages / [place] Cousin   . Thyroid disease Sister    History  Substance Use Topics  . Smoking status: Current Every Day Smoker    Types:  Cigarettes    Last Attempt to Quit: 05/24/2012  . Smokeless tobacco: Never Used  . Alcohol Use: 0.0 oz/week   OB History    Gravida Para Term Preterm AB TAB SAB Ectopic Multiple Living   1 1 1       1       LEVEL 5 CAVEAT: PT SLIGHTLY INTOXICATED Review of Systems  Constitutional: Negative for fever and chills.  Respiratory: Negative for shortness of breath.   Cardiovascular: Negative for chest pain.  Gastrointestinal: Positive for vomiting and abdominal pain. Negative for nausea, diarrhea, constipation, blood in stool, melena, hematochezia, flatus and hematemesis.  Genitourinary: Negative for dysuria, hematuria, vaginal bleeding and vaginal discharge.  Musculoskeletal: Negative for myalgias and arthralgias.  Skin: Negative for color change.  Allergic/Immunologic: Negative for immunocompromised state.  Neurological: Negative for weakness and numbness.  Psychiatric/Behavioral: Negative for confusion.       +intoxicated   10 Systems reviewed and are negative for acute change except as noted in the HPI.    Allergies  Other  Home Medications   Prior to Admission medications   Medication Sig Start Date End Date Taking? Authorizing Provider  albuterol (PROVENTIL HFA;VENTOLIN HFA) 108 (90 BASE) MCG/ACT inhaler Inhale 2 puffs into the lungs 2 (two) times daily as needed. For asthma 10/18/12   Silverio Lay, MD  albuterol (PROVENTIL) (5 MG/ML) 0.5% nebulizer solution Take 0.5 mLs (2.5 mg total) by nebulization every 6 (six) hours as needed. 10/18/12   Silverio Lay, MD  fluconazole (DIFLUCAN) 150 MG tablet Take 1 tablet (150 mg total) by mouth daily. X 1 dose 05/13/14   Mathis Fare Presson, PA  ibuprofen (ADVIL,MOTRIN) 600 MG tablet Take 1 tablet (600 mg total) by mouth every 6 (six) hours. 01/03/13   Haroldine Laws, CNM  montelukast (SINGULAIR) 10 MG tablet Take 1 tablet (10 mg total) by mouth at bedtime. 10/18/12   Silverio Lay, MD  oxyCODONE-acetaminophen (PERCOCET/ROXICET) 5-325 MG per  tablet Take 1-2 tablets by mouth every 4 (four) hours as needed. 01/03/13   Haroldine Laws, CNM  Prenatal Vit-Fe Fumarate-FA (PRENATAL MULTIVITAMIN) TABS Take 1 tablet by mouth daily.    Historical Provider, MD  triamcinolone cream (KENALOG) 0.1 % Apply 1 application topically 3 (three) times daily as needed. For eczema    Historical Provider, MD   BP 119/72 mmHg  Pulse 95  Temp(Src) 97.9 F (36.6 C)  Resp 18  Ht 5\' 6"  (1.676 m)  Wt 180 lb (81.647 kg)  BMI 29.07 kg/m2  SpO2 100%  LMP 11/09/2014  Breastfeeding? No Physical Exam  Constitutional: She is oriented to person, place, and time. Vital signs are normal. She appears well-developed and well-nourished.  Non-toxic appearance. She appears distressed (dry heaving).  Afebrile, nontoxic, dry heaving, appears intoxicated  HENT:  Head: Normocephalic and atraumatic.  Mouth/Throat: Oropharynx is clear and moist. Mucous membranes are dry.  Dry mucous membranes  Eyes: Conjunctivae and EOM are normal. Right eye exhibits no discharge. Left eye exhibits no discharge.  Neck:  Normal range of motion. Neck supple.  Cardiovascular: Normal rate, regular rhythm, normal heart sounds and intact distal pulses.  Exam reveals no gallop and no friction rub.   No murmur heard. Pulmonary/Chest: Effort normal and breath sounds normal. No respiratory distress. She has no decreased breath sounds. She has no wheezes. She has no rhonchi. She has no rales.  Abdominal: Soft. Normal appearance and bowel sounds are normal. She exhibits no distension. There is tenderness in the right upper quadrant and epigastric area. There is positive Murphy's sign. There is no rigidity, no rebound, no guarding, no CVA tenderness and no tenderness at McBurney's point.    Soft, nondistended, +BS throughout, with RUQ/epigastric TTP, no r/g/r, +murphy's sign, neg mcburney's, no CVA TTP   Musculoskeletal: Normal range of motion.  MAE x4  Neurological: She is alert and oriented to person,  place, and time. She has normal strength. No sensory deficit. GCS eye subscore is 4. GCS verbal subscore is 5. GCS motor subscore is 6.  A&O x4 GCS 15 Speech goal-oriented although seems slightly intoxicated still  Skin: Skin is warm, dry and intact. No rash noted.  Psychiatric: She has a normal mood and affect.  Nursing note and vitals reviewed.   ED Course  Procedures (including critical care time) Labs Review Labs Reviewed  CBC WITH DIFFERENTIAL/PLATELET - Abnormal; Notable for the following:    Hemoglobin 11.2 (*)    HCT 33.0 (*)    RDW 16.4 (*)    All other components within normal limits  COMPREHENSIVE METABOLIC PANEL - Abnormal; Notable for the following:    Chloride 113 (*)    GFR calc non Af Amer 88 (*)    All other components within normal limits  URINALYSIS, ROUTINE W REFLEX MICROSCOPIC - Abnormal; Notable for the following:    APPearance CLOUDY (*)    All other components within normal limits  ETHANOL - Abnormal; Notable for the following:    Alcohol, Ethyl (B) 105 (*)    All other components within normal limits  LIPASE, BLOOD  URINE RAPID DRUG SCREEN (HOSP PERFORMED)  POC URINE PREG, ED    Imaging Review US Abdomen Limited Ruq  12/06/2014   CLINICAL DATA:  Epigastric pain  EXAM: US ABDOMEN LIMITED - RIGHT UPPER QUADRANT  COMPARISON:  None.  FINDINGS: Gallbladder:  Gallbladder is contracted. No shadowing gallstones. No thickening of gallbladder wall. No sonographic Murphy's sign.  Common bile duct:  Diameter: 3.7 mm in diameter within normal limits.  Liver:  No focal lesion identified. Within normal limits in parenchymal echogenicity.  IMPRESSION: Contracted gallbladder without shadowing gallstones. Normal CBD. No sonographic Murphy's sign.   Electronically Signed   By: Natasha Mead M.D.   On: 12/06/2014 13:09     EKG Interpretation None      MDM   Final diagnoses:  Epigastric pain  Gastritis, alcoholic  Non-intractable vomiting with nausea, vomiting of  unspecified type  Alcohol abuse    24 y.o. female here with periumbilical abd pain/n/v since consuming EtOH until 2am. States she had some abd pain for several days prior and was consuming ibuprofen to help with pain. Now has RUQ/epigastric tenderness, +murphy's. Will obtain labs and RUQ U/S. Seems consistent with alcohol/NSAID induced gastritis. Story somewhat difficult to obtain due to pt being clinically still intoxicated. States her drink was within her eyesight at all times but concerned for possibility of drugs being slipped to her in addition, given the amount of alcohol she consumed. Will give fluids, zofran,  and obtain labs. Will monitor and reassess.   1:15 PM CBC showing chronic unchanged anemia, CMP WNL. Lipase WNL. EtOH level 105. U/S showing no cholecystitis or cholelithiasis. Awaiting urine to be obtained  3:44 PM Multiple delays unfortunately have slowed down the disposition of this pt. Upreg neg. U/A without UTI, but UDS pending. Pt clinically sobered now, tolerating PO well. Once UDS returns, will d/c with zofran and prilosec. Discussed avoidance of spicy foods and NSAIDs if possible. Also discussed drinking cessation. Pt overall feels better and ready to go home.  3:58 PM UDS neg. Will d/c home with previously discussed plan. Will have her f/up with PCP in 1wk. I explained the diagnosis and have given explicit precautions to return to the ER including for any other new or worsening symptoms. The patient understands and accepts the medical plan as it's been dictated and I have answered their questions. Discharge instructions concerning home care and prescriptions have been given. The patient is STABLE and is discharged to home in good condition.  BP 102/78 mmHg  Pulse 96  Temp(Src) 97.9 F (36.6 C)  Resp 17  Ht  (1.676 m)  Wt 180 lb (81.647 kg)  BMI 29.07 kg/m2  SpO2 100%  LMP 11/09/2014  Breastfeeding? No  Meds ordered this encounter  Medications  . ondansetron  (ZOFRAN) injection 4 mg    Sig:   . sodium chloride 0.9 % bolus 1,000 mL    Sig:   . pantoprazole (PROTONIX) injection 40 mg    Sig:   . sodium chloride 0.9 % bolus 1,000 mL    Sig:   . ondansetron (ZOFRAN) 8 MG tablet    Sig: Take 1 tablet (8 mg total) by mouth every 8 (eight) hours as needed for nausea or vomiting.    Dispense:  10 tablet    Refill:  0    Order Specific Question:  Supervising Provider    Answer:  Hyacinth Meeker, BRIAN [[id number]]  . omeprazole (PRILOSEC) 20 MG capsule    Sig: Take 1 capsule (20 mg total) by mouth daily.    Dispense:  30 capsule    Refill:  0    Order Specific Question:  Supervising Provider    Answer:  Eber Hong [[id number]]     Marguarite Markov Camprubi-Soms, PA-C 12/06/14 1600  Linwood Dibbles, MD 12/06/14 [phone]

## 2014-12-06 NOTE — ED Notes (Signed)
Main lab called concerning status of UA.  Lab reports that they have received the urine and are working on it now.

## 2014-12-06 NOTE — Discharge Instructions (Signed)
Your abdominal pain is likely from taking ibuprofen and consuming large amounts of alcohol last night. Take prilosec as directed and use zofran as directed as needed for nausea. Stay away from spicy foods. DON'T DRINK ALCOHOL! Avoid NSAIDs like ibuprofen when possible, but if you need to take it always take it on a full stomach. Stay well hydrated. See your regular doctor in 1 week for recheck of symptoms. Return to the ER for changes or worsening symptoms.   Abdominal (belly) pain can be caused by many things. Your caregiver performed an examination and possibly ordered blood/urine tests and imaging (CT scan, x-rays, ultrasound). Many cases can be observed and treated at home after initial evaluation in the emergency department. Even though you are being discharged home, abdominal pain can be unpredictable. Therefore, you need a repeated exam if your pain does not resolve, returns, or worsens. Most patients with abdominal pain don't have to be admitted to the hospital or have surgery, but serious problems like appendicitis and gallbladder attacks can start out as nonspecific pain. Many abdominal conditions cannot be diagnosed in one visit, so follow-up evaluations are very important. SEEK IMMEDIATE MEDICAL ATTENTION IF YOU DEVELOP ANY OF THE FOLLOWING SYMPTOMS:  The pain does not go away or becomes severe.   A temperature above 101 develops.   Repeated vomiting occurs (multiple episodes).   The pain becomes localized to portions of the abdomen. The right side could possibly be appendicitis. In an adult, the left lower portion of the abdomen could be colitis or diverticulitis.   Blood is being passed in stools or vomit (bright red or black tarry stools).   Return also if you develop chest pain, difficulty breathing, dizziness or fainting, or become confused, poorly responsive, or inconsolable (young children).  The constipation stays for more than 4 days.   There is belly (abdominal) or rectal  pain.   You do not seem to be getting better.     Abdominal Pain Many things can cause belly (abdominal) pain. Most times, the belly pain is not dangerous. Many cases of belly pain can be watched and treated at home. HOME CARE   Do not take medicines that help you go poop (laxatives) unless told to by your doctor.  Only take medicine as told by your doctor.  Eat or drink as told by your doctor. Your doctor will tell you if you should be on a special diet. GET HELP IF:  You do not know what is causing your belly pain.  You have belly pain while you are sick to your stomach (nauseous) or have runny poop (diarrhea).  You have pain while you pee or poop.  Your belly pain wakes you up at night.  You have belly pain that gets worse or better when you eat.  You have belly pain that gets worse when you eat fatty foods.  You have a fever. GET HELP RIGHT AWAY IF:   The pain does not go away within 2 hours.  You keep throwing up (vomiting).  The pain changes and is only in the right or left part of the belly.  You have bloody or tarry looking poop. MAKE SURE YOU:   Understand these instructions.  Will watch your condition.  Will get help right away if you are not doing well or get worse. Document Released: 02/15/2008 Document Revised: 09/03/2013 Document Reviewed: 05/08/2013 [facility] Patient Information 2015 [place], [place]. This information is not intended to replace advice given to you by your health  care provider. Make sure you discuss any questions you have with your health care provider.  Alcohol Intoxication Alcohol intoxication occurs when you drink enough alcohol that it affects your ability to function. It can be mild or very severe. Drinking a lot of alcohol in a short time is called binge drinking. This can be very harmful. Drinking alcohol can also be more dangerous if you are taking medicines or other drugs. Some of the effects caused by alcohol may include:  Loss  of coordination.  Changes in mood and behavior.  Unclear thinking.  Trouble talking (slurred speech).  Throwing up (vomiting).  Confusion.  Slowed breathing.  Twitching and shaking (seizures).  Loss of consciousness. HOME CARE  Do not drive after drinking alcohol.  Drink enough water and fluids to keep your pee (urine) clear or pale yellow. Avoid caffeine.  Only take medicine as told by your doctor. GET HELP IF:  You throw up (vomit) many times.  You do not feel better after a few days.  You frequently have alcohol intoxication. Your doctor can help decide if you should see a substance use treatment counselor. GET HELP RIGHT AWAY IF:  You become shaky when you stop drinking.  You have twitching and shaking.  You throw up blood. It may look bright red or like coffee grounds.  You notice blood in your poop (bowel movements).  You become lightheaded or pass out (faint). MAKE SURE YOU:   Understand these instructions.  Will watch your condition.  Will get help right away if you are not doing well or get worse. Document Released: 02/15/2008 Document Revised: 05/01/2013 Document Reviewed: 02/01/2013 [facility] Patient Information 2015 [place]. This information is not intended to replace advice given to you by your health care provider. Make sure you discuss any questions you have with your health care provider.  Gastritis, Adult Gastritis is soreness and puffiness (inflammation) of the lining of the stomach. If you do not get help, gastritis can cause bleeding and sores (ulcers) in the stomach. HOME CARE   Only take medicine as told by your doctor.  If you were given antibiotic medicines, take them as told. Finish the medicines even if you start to feel better.  Drink enough fluids to keep your pee (urine) clear or pale yellow.  Avoid foods and drinks that make your problems worse. Foods you may want to avoid include:  Caffeine or  alcohol.  Chocolate.  Mint.  Garlic and onions.  Spicy foods.  Citrus fruits, including oranges, lemons, or limes.  Food containing tomatoes, including sauce, chili, salsa, and pizza.  Fried and fatty foods.  Eat small meals throughout the day instead of large meals. GET HELP RIGHT AWAY IF:   You have black or dark red poop (stools).  You throw up (vomit) blood. It may look like coffee grounds.  You cannot keep fluids down.  Your belly (abdominal) pain gets worse.  You have a fever.  You do not feel better after 1 week.  You have any other questions or concerns. MAKE SURE YOU:   Understand these instructions.  Will watch your condition.  Will get help right away if you are not doing well or get worse. Document Released: 02/15/2008 Document Revised: 11/21/2011 Document Reviewed: 10/12/2011 [facility] Patient Information 2015 [place]. This information is not intended to replace advice given to you by your health care provider. Make sure you discuss any questions you have with your health care provider.  How Much is Too Much  Alcohol? Drinking too much alcohol can cause injury, accidents, and health problems. These types of problems can include:   Car crashes.  Falls.  Family fighting (domestic violence).  Drowning.  Fights.  Injuries.  Burns.  Damage to certain organs.  Having a baby with birth defects. ONE DRINK CAN BE TOO MUCH WHEN YOU ARE:  Working.  Pregnant or breastfeeding.  Taking medicines. Ask your doctor.  Driving or planning to drive. WHAT IS A STANDARD DRINK?   1 regular beer (12 ounces or 360 milliliters).  1 glass of wine (5 ounces or 150 milliliters).  1 shot of liquor (1.5 ounces or 45 milliliters). BLOOD ALCOHOL LEVELS   .00 A person is sober.  Marland Kitchen03 A person has no trouble keeping balance, talking, or seeing right, but a "buzz" may be felt.  Marland Kitchen05 A person feels "buzzed" and relaxed.  Marland Kitchen08 or .10  A person is drunk.  He or she has trouble talking, seeing right, and keeping his or her balance.  .15 A person loses body control and may pass out (blackout).  .20 A person has trouble walking (staggering) and throws up (vomits).  .30 A person will pass out (unconscious).  .40+ A person will be in a coma. Death is possible. If you or someone you know has a drinking problem, get help from a doctor.  Document Released: 06/25/2009 Document Revised: 11/21/2011 Document Reviewed: 06/25/2009 [facility] Patient Information 2015 [place], [place]. This information is not intended to replace advice given to you by your health care provider. Make sure you discuss any questions you have with your health care provider.  Nausea and Vomiting Nausea is a sick feeling that often comes before throwing up (vomiting). Vomiting is a reflex where stomach contents come out of your mouth. Vomiting can cause severe loss of body fluids (dehydration). Children and elderly adults can become dehydrated quickly, especially if they also have diarrhea. Nausea and vomiting are symptoms of a condition or disease. It is important to find the cause of your symptoms. CAUSES   Direct irritation of the stomach lining. This irritation can result from increased acid production (gastroesophageal reflux disease), infection, food poisoning, taking certain medicines (such as nonsteroidal anti-inflammatory drugs), alcohol use, or tobacco use.  Signals from the brain.These signals could be caused by a headache, heat exposure, an inner ear disturbance, increased pressure in the brain from injury, infection, a tumor, or a concussion, pain, emotional stimulus, or metabolic problems.  An obstruction in the gastrointestinal tract (bowel obstruction).  Illnesses such as diabetes, hepatitis, gallbladder problems, appendicitis, kidney problems, cancer, sepsis, atypical symptoms of a heart attack, or eating disorders.  Medical treatments such as chemotherapy and  radiation.  Receiving medicine that makes you sleep (general anesthetic) during surgery. DIAGNOSIS Your caregiver may ask for tests to be done if the problems do not improve after a few days. Tests may also be done if symptoms are severe or if the reason for the nausea and vomiting is not clear. Tests may include:  Urine tests.  Blood tests.  Stool tests.  Cultures (to look for evidence of infection).  X-rays or other imaging studies. Test results can help your caregiver make decisions about treatment or the need for additional tests. TREATMENT You need to stay well hydrated. Drink frequently but in small amounts.You may wish to drink water, sports drinks, clear broth, or eat frozen ice pops or gelatin dessert to help stay hydrated.When you eat, eating slowly may help prevent nausea.There are also some antinausea  medicines that may help prevent nausea. HOME CARE INSTRUCTIONS   Take all medicine as directed by your caregiver.  If you do not have an appetite, do not force yourself to eat. However, you must continue to drink fluids.  If you have an appetite, eat a normal diet unless your caregiver tells you differently.  Eat a variety of complex carbohydrates (rice, wheat, potatoes, bread), lean meats, yogurt, fruits, and vegetables.  Avoid high-fat foods because they are more difficult to digest.  Drink enough water and fluids to keep your urine clear or pale yellow.  If you are dehydrated, ask your caregiver for specific rehydration instructions. Signs of dehydration may include:  Severe thirst.  Dry lips and mouth.  Dizziness.  Dark urine.  Decreasing urine frequency and amount.  Confusion.  Rapid breathing or pulse. SEEK IMMEDIATE MEDICAL CARE IF:   You have blood or brown flecks (like coffee grounds) in your vomit.  You have black or bloody stools.  You have a severe headache or stiff neck.  You are confused.  You have severe abdominal pain.  You have  chest pain or trouble breathing.  You do not urinate at least once every 8 hours.  You develop cold or clammy skin.  You continue to vomit for longer than 24 to 48 hours.  You have a fever. MAKE SURE YOU:   Understand these instructions.  Will watch your condition.  Will get help right away if you are not doing well or get worse. Document Released: 08/29/2005 Document Revised: 11/21/2011 Document Reviewed: 01/26/2011 [facility] Patient Information 2015 [place], [place]. This information is not intended to replace advice given to you by your health care provider. Make sure you discuss any questions you have with your health care provider.

## 2014-12-06 NOTE — ED Notes (Signed)
Pt able to tolerate PO fluids.  

## 2014-12-06 NOTE — ED Notes (Signed)
Pt reports that she was drinking last night until about 0200 this morning.  Pt had " a lot of cups of liquor."  Pt reports that her drink was within her eyesight at all times.  At around 0400 this morning pt began to have periumbilical pain with n/v/d.

## 2015-05-26 ENCOUNTER — Inpatient Hospital Stay (HOSPITAL_COMMUNITY)
Admission: AD | Admit: 2015-05-26 | Discharge: 2015-05-27 | Disposition: A | Discharge status: Home / Self Care | Payer: Medicaid Other | Source: Ambulatory Visit | Attending: Obstetrics & Gynecology | Admitting: Obstetrics & Gynecology

## 2015-05-26 ENCOUNTER — Encounter (HOSPITAL_COMMUNITY): Payer: Self-pay

## 2015-05-26 DIAGNOSIS — D62 Acute posthemorrhagic anemia: Secondary | ICD-10-CM | POA: Insufficient documentation

## 2015-05-26 DIAGNOSIS — G43909 Migraine, unspecified, not intractable, without status migrainosus: Secondary | ICD-10-CM | POA: Insufficient documentation

## 2015-05-26 DIAGNOSIS — G43009 Migraine without aura, not intractable, without status migrainosus: Secondary | ICD-10-CM

## 2015-05-26 DIAGNOSIS — N920 Excessive and frequent menstruation with regular cycle: Secondary | ICD-10-CM | POA: Insufficient documentation

## 2015-05-26 DIAGNOSIS — J45909 Unspecified asthma, uncomplicated: Secondary | ICD-10-CM | POA: Insufficient documentation

## 2015-05-26 DIAGNOSIS — N939 Abnormal uterine and vaginal bleeding, unspecified: Secondary | ICD-10-CM | POA: Insufficient documentation

## 2015-05-26 LAB — URINE MICROSCOPIC-ADD ON

## 2015-05-26 LAB — CBC
HEMATOCRIT: 19.9 % — AB (ref 36.0–46.0)
HEMOGLOBIN: 6.4 g/dL — AB (ref 12.0–15.0)
MCH: 24.9 pg — AB (ref 26.0–34.0)
MCHC: 32.2 g/dL (ref 30.0–36.0)
MCV: 77.4 fL — ABNORMAL LOW (ref 78.0–100.0)
Platelets: 343 10*3/uL (ref 150–400)
RBC: 2.57 MIL/uL — AB (ref 3.87–5.11)
RDW: 18.1 % — ABNORMAL HIGH (ref 11.5–15.5)
WBC: 8 10*3/uL (ref 4.0–10.5)

## 2015-05-26 LAB — WET PREP, GENITAL
TRICH WET PREP: NONE SEEN
Yeast Wet Prep HPF POC: NONE SEEN

## 2015-05-26 LAB — URINALYSIS, ROUTINE W REFLEX MICROSCOPIC
Bilirubin Urine: NEGATIVE
GLUCOSE, UA: NEGATIVE mg/dL
Ketones, ur: NEGATIVE mg/dL
LEUKOCYTES UA: NEGATIVE
Nitrite: NEGATIVE
PH: 7 (ref 5.0–8.0)
Protein, ur: NEGATIVE mg/dL
Specific Gravity, Urine: 1.01 (ref 1.005–1.030)
Urobilinogen, UA: 0.2 mg/dL (ref 0.0–1.0)

## 2015-05-26 LAB — POCT PREGNANCY, URINE: Preg Test, Ur: NEGATIVE

## 2015-05-26 MED ORDER — METOCLOPRAMIDE HCL 5 MG/ML IJ SOLN
10.0000 mg | Freq: Once | INTRAMUSCULAR | Status: AC
  Administered 2015-05-27: 10 mg via INTRAVENOUS
  Filled 2015-05-26: qty 2

## 2015-05-26 MED ORDER — SODIUM CHLORIDE 0.9 % IV SOLN
510.0000 mg | Freq: Once | INTRAVENOUS | Status: AC
  Administered 2015-05-27: 510 mg via INTRAVENOUS
  Filled 2015-05-26: qty 17

## 2015-05-26 MED ORDER — LACTATED RINGERS IV BOLUS (SEPSIS)
1000.0000 mL | Freq: Once | INTRAVENOUS | Status: AC
Start: 2015-05-26 — End: 2015-05-27
  Administered 2015-05-26: 1000 mL via INTRAVENOUS

## 2015-05-26 MED ORDER — DIPHENHYDRAMINE HCL 50 MG/ML IJ SOLN
25.0000 mg | Freq: Once | INTRAMUSCULAR | Status: AC
  Administered 2015-05-27: 25 mg via INTRAVENOUS
  Filled 2015-05-26: qty 1

## 2015-05-26 MED ORDER — DEXAMETHASONE SODIUM PHOSPHATE 10 MG/ML IJ SOLN
10.0000 mg | Freq: Once | INTRAMUSCULAR | Status: AC
  Administered 2015-05-27: 10 mg via INTRAVENOUS
  Filled 2015-05-26: qty 1

## 2015-05-26 NOTE — MAU Note (Signed)
Pt states that she has been bleeding since 04/15/2015. Passing clots, states largest was bigger than a golf. Having some lower abdominal cramping-not every day. Denies pain at this moment. Take advil which does not help when she has the pain.

## 2015-05-26 NOTE — Discharge Instructions (Signed)
Menorrhagia °Menorrhagia is the medical term for when your menstrual periods are heavy or last longer than usual. With menorrhagia, every period you have may cause enough blood loss and cramping that you are unable to maintain your usual activities. °CAUSES  °In some cases, the cause of heavy periods is unknown, but a number of conditions may cause menorrhagia. Common causes include: °· A problem with the hormone-producing thyroid gland (hypothyroid). °· Noncancerous growths in the uterus (polyps or fibroids). °· An imbalance of the estrogen and progesterone hormones. °· One of your ovaries not releasing an egg during one or more months. °· Side effects of having an intrauterine device (IUD). °· Side effects of some medicines, such as anti-inflammatory medicines or blood thinners. °· A bleeding disorder that stops your blood from clotting normally. °SIGNS AND SYMPTOMS  °During a normal period, bleeding lasts between 4 and 8 days. Signs that your periods are too heavy include: °· You routinely have to change your pad or tampon every 1 or 2 hours because it is completely soaked. °· You pass blood clots larger than 1 inch (2.5 cm) in size. °· You have bleeding for more than 7 days. °· You need to use pads and tampons at the same time because of heavy bleeding. °· You need to wake up to change your pads or tampons during the night. °· You have symptoms of anemia, such as tiredness, fatigue, or shortness of breath.  °DIAGNOSIS  °Your health care provider will perform a physical exam and ask you questions about your symptoms and menstrual history. Other tests may be ordered based on what the health care provider finds during the exam. These tests can include: °· Blood tests. Blood tests are used to check if you are pregnant or have hormonal changes, a bleeding or thyroid disorder, low iron levels (anemia), or other problems. °· Endometrial biopsy. Your health care provider takes a sample of tissue from the inside of your  uterus to be examined under a microscope. °· Pelvic ultrasound. This test uses sound waves to make a picture of your uterus, ovaries, and vagina. The pictures can show if you have fibroids or other growths. °· Hysteroscopy. For this test, your health care provider will use a small telescope to look inside your uterus. °Based on the results of your initial tests, your health care provider may recommend further testing. °TREATMENT  °Treatment may not be needed. If it is needed, your health care provider may recommend treatment with one or more medicines first. If these do not reduce bleeding enough, a surgical treatment might be an option. The best treatment for you will depend on:  °· Whether you need to prevent pregnancy.   °· Your desire to have children in the future. °· The cause and severity of your bleeding. °· Your opinion and personal preference.   °Medicines for menorrhagia may include: °· Birth control methods that use hormones. These include the pill, skin patch, vaginal ring, shots that you get every 3 months, hormonal IUD, and implant. These treatments reduce bleeding during your menstrual period. °· Medicines that thicken blood and slow bleeding. °· Medicines that reduce swelling, such as ibuprofen.  °· Medicines that contain a synthetic hormone called progestin.   °· Medicines that make the ovaries stop working for a short time.   °You may need surgical treatment for menorrhagia if the medicines are unsuccessful. Treatment options include: °· Dilation and curettage (D&C). In this procedure, your health care provider opens (dilates) your cervix and then scrapes or suctions tissue from   the lining of your uterus to reduce menstrual bleeding.  Operative hysteroscopy. This procedure uses a tiny tube with a light (hysteroscope) to view your uterine cavity and can help in the surgical removal of a polyp that may be causing heavy periods.  Endometrial ablation. Through various techniques, your health care  provider permanently destroys the entire lining of your uterus (endometrium). After endometrial ablation, most women have little or no menstrual flow. Endometrial ablation reduces your ability to become pregnant.  Endometrial resection. This surgical procedure uses an electrosurgical wire loop to remove the lining of the uterus. This procedure also reduces your ability to become pregnant.  Hysterectomy. Surgical removal of the uterus and cervix is a permanent procedure that stops menstrual periods. Pregnancy is not possible after a hysterectomy. This procedure requires anesthesia and hospitalization. HOME CARE INSTRUCTIONS   Only take over-the-counter or prescription medicines as directed by your health care provider. Take prescribed medicines exactly as directed. Do not change or switch medicines without consulting your health care provider.  Take any prescribed iron pills exactly as directed by your health care provider. Long-term heavy bleeding may result in low iron levels. Iron pills help replace the iron your body lost from heavy bleeding. Iron may cause constipation. If this becomes a problem, increase the bran, fruits, and roughage in your diet.  Do not take aspirin or medicines that contain aspirin 1 week before or during your menstrual period. Aspirin may make the bleeding worse.  If you need to change your sanitary pad or tampon more than once every 2 hours, stay in bed and rest as much as possible until the bleeding stops.  Eat well-balanced meals. Eat foods high in iron. Examples are leafy green vegetables, meat, liver, eggs, and whole grain breads and cereals. Do not try to lose weight until the abnormal bleeding has stopped and your blood iron level is back to normal. SEEK MEDICAL CARE IF:   You soak through a pad or tampon every 1 or 2 hours, and this happens every time you have a period.  You need to use pads and tampons at the same time because you are bleeding so much.  You  need to change your pad or tampon during the night.  You have a period that lasts for more than 8 days.  You pass clots bigger than 1 inch wide.  You have irregular periods that happen more or less often than once a month.  You feel dizzy or faint.  You feel very weak or tired.  You feel short of breath or feel your heart is beating too fast when you exercise.  You have nausea and vomiting or diarrhea while you are taking your medicine.  You have any problems that may be related to the medicine you are taking. SEEK IMMEDIATE MEDICAL CARE IF:   You soak through 4 or more pads or tampons in 2 hours.  You have any bleeding while you are pregnant. MAKE SURE YOU:   Understand these instructions.  Will watch your condition.  Will get help right away if you are not doing well or get worse. Document Released: 08/29/2005 Document Revised: 09/03/2013 Document Reviewed: 02/17/2013 [facility] Patient Information 2015 [place], [place]. This information is not intended to replace advice given to you by your health care provider. Make sure you discuss any questions you have with your health care provider. Iron Deficiency Anemia Anemia is a condition in which there are less red blood cells or hemoglobin in the blood than  normal. Hemoglobin is the part of red blood cells that carries oxygen. Iron deficiency anemia is anemia caused by too little iron. It is the most common type of anemia. It may leave you tired and short of breath. CAUSES   Lack of iron in the diet.  Poor absorption of iron, as seen with intestinal disorders.  Intestinal bleeding.  Heavy periods. SIGNS AND SYMPTOMS  Mild anemia may not be noticeable. Symptoms may include:  Fatigue.  Headache.  Pale skin.  Weakness.  Tiredness.  Shortness of breath.  Dizziness.  Cold hands and feet.  Fast or irregular heartbeat. DIAGNOSIS  Diagnosis requires a thorough evaluation and physical exam by your health care  provider. Blood tests are generally used to confirm iron deficiency anemia. Additional tests may be done to find the underlying cause of your anemia. These may include:  Testing for blood in the stool (fecal occult blood test).  A procedure to see inside the colon and rectum (colonoscopy).  A procedure to see inside the esophagus and stomach (endoscopy). TREATMENT  Iron deficiency anemia is treated by correcting the cause of the deficiency. Treatment may involve:  Adding iron-rich foods to your diet.  Taking iron supplements. Pregnant or breastfeeding women need to take extra iron because their normal diet usually does not provide the required amount.  Taking vitamins. Vitamin C improves the absorption of iron. Your health care provider may recommend that you take your iron tablets with a glass of orange juice or vitamin C supplement.  Medicines to make heavy menstrual flow lighter.  Surgery. HOME CARE INSTRUCTIONS   Take iron as directed by your health care provider.  If you cannot tolerate taking iron supplements by mouth, talk to your health care provider about taking them through a vein (intravenously) or an injection into a muscle.  For the best iron absorption, iron supplements should be taken on an empty stomach. If you cannot tolerate them on an empty stomach, you may need to take them with food.  Do not drink milk or take antacids at the same time as your iron supplements. Milk and antacids may interfere with the absorption of iron.  Iron supplements can cause constipation. Make sure to include fiber in your diet to prevent constipation. A stool softener may also be recommended.  Take vitamins as directed by your health care provider.  Eat a diet rich in iron. Foods high in iron include liver, lean beef, whole-grain bread, eggs, dried fruit, and dark green leafy vegetables. SEEK IMMEDIATE MEDICAL CARE IF:   You faint. If this happens, do not drive. Call your local  emergency services (911 in [place] if no other help is available.  You have chest pain.  You feel nauseous or vomit.  You have severe or increased shortness of breath with activity.  You feel weak.  You have a rapid heartbeat.  You have unexplained sweating.  You become light-headed when getting up from a chair or bed. MAKE SURE YOU:   Understand these instructions.  Will watch your condition.  Will get help right away if you are not doing well or get worse. Document Released: 08/26/2000 Document Revised: 09/03/2013 Document Reviewed: 05/06/2013 [facility] Patient Information 2015 [place], [place]. This information is not intended to replace advice given to you by your health care provider. Make sure you discuss any questions you have with your health care provider.  High-Fiber Diet Fiber is found in fruits, vegetables, and grains. A high-fiber diet encourages the addition of more whole  grains, legumes, fruits, and vegetables in your diet. The recommended amount of fiber for adult males is 38 g per day. For adult females, it is 25 g per day. Pregnant and lactating women should get 28 g of fiber per day. If you have a digestive or bowel problem, ask your caregiver for advice before adding high-fiber foods to your diet. Eat a variety of high-fiber foods instead of only a select few type of foods.  PURPOSE  To increase stool bulk.  To make bowel movements more regular to prevent constipation.  To lower cholesterol.  To prevent overeating. WHEN IS THIS DIET USED?  It may be used if you have constipation and hemorrhoids.  It may be used if you have uncomplicated diverticulosis (intestine condition) and irritable bowel syndrome.  It may be used if you need help with weight management.  It may be used if you want to add it to your diet as a protective measure against atherosclerosis, diabetes, and cancer. SOURCES OF FIBER  Whole-grain breads and cereals.  Fruits, such as apples,  oranges, bananas, berries, prunes, and pears.  Vegetables, such as green peas, carrots, sweet potatoes, beets, broccoli, cabbage, spinach, and artichokes.  Legumes, such split peas, soy, lentils.  Almonds. FIBER CONTENT IN FOODS Starches and Grains / Dietary Fiber (g)  Raisin Bran, 1 cup/ 7g  Cheerios, 1 cup / 3 g  Corn Flakes cereal, 1 cup / 0.7 g  Rice crispy treat cereal, 1 cup / 0.3 g  Instant oatmeal (cooked),  cup / 2 g  Frosted wheat cereal, 1 cup / 5.1 g  Brown, long-grain rice (cooked), 1 cup / 3.5 g  White, long-grain rice (cooked), 1 cup / 0.6 g  Enriched macaroni (cooked), 1 cup / 2.5 g Legumes / Dietary Fiber (g)  Baked beans (canned, plain, or vegetarian),  cup / 5.2 g  Kidney beans (canned),  cup / 6.8 g  Pinto beans (cooked),  cup / 5.5 g Breads and Crackers / Dietary Fiber (g)  Plain or honey graham crackers, 2 squares / 0.7 g  Saltine crackers, 3 squares / 0.3 g  Plain, salted pretzels, 10 pieces / 1.8 g  Whole-wheat bread, 1 slice / 1.9 g  White bread, 1 slice / 0.7 g  Raisin bread, 1 slice / 1.2 g  Plain bagel, 3 oz / 2 g  Flour tortilla, 1 oz / 0.9 g  Corn tortilla, 1 small / 1.5 g  Hamburger or hotdog bun, 1 small / 0.9 g Fruits / Dietary Fiber (g)  Apple with skin, 1 medium / 4.4 g  Sweetened applesauce,  cup / 1.5 g  Banana,  medium / 1.5 g  Grapes, 10 grapes / 0.4 g  Orange, 1 small / 2.3 g  Raisin, 1.5 oz / 1.6 g  Melon, 1 cup / 1.4 g Vegetables / Dietary Fiber (g)  Green beans (canned),  cup / 1.3 g  Carrots (cooked),  cup / 2.3 g  Broccoli (cooked),  cup / 2.8 g  Peas (cooked),  cup / 4.4 g  Mashed potatoes,  cup / 1.6 g  Lettuce, 1 cup / 0.5 g  Corn (canned),  cup / 1.6 g  Tomato,  cup / 1.1 g Document Released: 08/29/2005 Document Revised: 02/28/2012 Document Reviewed: 12/01/2011 [employer] Patient Information 2015 [place], [place]. This information is not intended to replace advice given  to you by your health care provider. Make sure you discuss any questions you have with your health care  provider.

## 2015-05-26 NOTE — MAU Provider Note (Signed)
Chief Complaint: No chief complaint on file.   First Provider Initiated Contact with Patient 05/26/15 2154      SUBJECTIVE HPI: Lindsey Castillo is a 24 y.o. G1P1001 at Unknown by LMP who presents to maternity admissions reporting vaginal bleeding x 1 month, with regular but increasingly longer periods for the last few months.  She reports moderate to heavy bleeding every day for the last month.  She also reports dizziness when going from sitting position to standing and recent craving for ice chips.  She has had headaches off and on since onset of bleeding this month, similar to previous migraines but harder to resolve.  She denies diagnosis of anemia but reports it is common in her family.  She reports intermittent cramping, not daily but often associated with this bleeding.  She denies vaginal itching/burning, urinary symptoms, vomiting, or fever/chills.     Vaginal Bleeding The patient's pertinent negatives include no pelvic pain or vaginal discharge. Associated symptoms include abdominal pain, headaches and nausea. Pertinent negatives include no chills, constipation, diarrhea, dysuria, fever, flank pain, frequency or vomiting. Nothing aggravates the symptoms. She has tried nothing for the symptoms. She is sexually active. No, her partner does not have an STD. She uses nothing for contraception. Her menstrual history has been regular.  Abdominal Pain This is a recurrent problem. The current episode started 1 to 4 weeks ago. The onset quality is gradual. The problem occurs intermittently. The problem has been waxing and waning. The pain is located in the LLQ and RLQ. The pain is moderate. The quality of the pain is cramping. The abdominal pain radiates to the pelvis. Associated symptoms include headaches and nausea. Pertinent negatives include no constipation, diarrhea, dysuria, fever, frequency or vomiting. Nothing aggravates the pain. She has tried acetaminophen (NSAIDs) for the symptoms. The  treatment provided no relief.  Headache  This is a new problem. The current episode started 1 to 4 weeks ago. The problem occurs intermittently. The problem has been waxing and waning. The pain is located in the frontal region. The pain does not radiate. The pain quality is similar to prior headaches. The quality of the pain is described as squeezing and dull. The pain is severe. Associated symptoms include abdominal pain, dizziness, nausea and photophobia. Pertinent negatives include no blurred vision, fever, loss of balance, neck pain, sinus pressure, tinnitus, visual change, vomiting or weakness. The symptoms are aggravated by bright light, activity and fatigue. She has tried acetaminophen and NSAIDs for the symptoms. The treatment provided no relief.    Past Medical History  Diagnosis Date  . Headache(784.0)   . Seasonal allergies   . Eczema   . Abnormal Pap smear     last pap 07/2012  . Infection     trich  . Infection     BV  . Infection     HX OF FREQUENT  . Asthma     USES INHALER  . Fracture, ankle AGE 58  . NSVD (normal spontaneous vaginal delivery) 01/01/2013    SVD at 1525   Past Surgical History  Procedure Laterality Date  . Foot surgery     Social History   Social History  . Marital Status: Single    Spouse Name: N/A  . Number of Children: N/A  . Years of Education: 12   Occupational History  . CASHIER    Social History Main Topics  . Smoking status: Current Every Day Smoker    Types: Cigarettes    Last Attempt  to Quit: 05/24/2012  . Smokeless tobacco: Never Used  . Alcohol Use: 0.0 oz/week  . Drug Use: No     Comment: yesterday used; OOCATIONALLY; LAST USE 05/16/12  . Sexual Activity:    Partners: Male    Pharmacist, hospital Protection: None   Other Topics Concern  . Not on file   Social History Narrative   No current facility-administered medications on file prior to encounter.   Current Outpatient Prescriptions on File Prior to Encounter  Medication  Sig Dispense Refill  . albuterol (PROVENTIL) (5 MG/ML) 0.5% nebulizer solution Take 0.5 mLs (2.5 mg total) by nebulization every 6 (six) hours as needed. 20 mL 3  . IBUPROFEN PO Take 400 tablets by mouth every 6 (six) hours as needed (headache).     . triamcinolone cream (KENALOG) 0.1 % Apply 1 application topically 3 (three) times daily as needed (eczema). For eczema    . omeprazole (PRILOSEC) 20 MG capsule Take 1 capsule (20 mg total) by mouth daily. (Patient not taking: Reported on 05/26/2015) 30 capsule 0  . ondansetron (ZOFRAN) 8 MG tablet Take 1 tablet (8 mg total) by mouth every 8 (eight) hours as needed for nausea or vomiting. (Patient not taking: Reported on 05/26/2015) 10 tablet 0   Allergies  Allergen Reactions  . Coconut Oil Itching and Swelling  . Other     SEASONAL    ROS:  Review of Systems  Constitutional: Negative for fever, chills and fatigue.  HENT: Negative for sinus pressure and tinnitus.   Eyes: Positive for photophobia. Negative for blurred vision.  Respiratory: Negative for shortness of breath.   Cardiovascular: Negative for chest pain.  Gastrointestinal: Positive for nausea and abdominal pain. Negative for vomiting, diarrhea and constipation.  Genitourinary: Positive for vaginal bleeding. Negative for dysuria, frequency, flank pain, vaginal discharge, difficulty urinating, vaginal pain and pelvic pain.  Musculoskeletal: Negative for neck pain.  Neurological: Positive for dizziness and headaches. Negative for weakness and loss of balance.  Psychiatric/Behavioral: Negative.      I have reviewed patient's Past Medical Hx, Surgical Hx, Family Hx, Social Hx, medications and allergies.   Physical Exam   Patient Vitals for the past 24 hrs:  BP Temp Temp src Pulse Resp SpO2 Height Weight  05/26/15 2103 115/70 mmHg 98.9 F (37.2 C) Oral 99 16 100 % 5' 5.6" (1.666 m) 63.05 kg (139 lb)   Constitutional: Well-developed, well-nourished female in no acute distress.   Cardiovascular: normal rate Respiratory: normal effort GI: Abd soft, non-tender. Pos BS x 4 MS: Extremities nontender, no edema, normal ROM Neurologic: Alert and oriented x 4.  GU: Neg CVAT.  PELVIC EXAM: Cervix pink, visually closed, without lesion, small amount dark red bleeding with small clots, vaginal walls and external genitalia normal Bimanual exam: Cervix 0/long/high, firm, anterior, neg CMT, uterus nontender, nonenlarged, adnexa without tenderness, enlargement, or mass   LAB RESULTS Results for orders placed or performed during the hospital encounter of 05/26/15 (from the past 24 hour(s))  Urinalysis, Routine w reflex microscopic (not at [facility])     Status: Abnormal   Collection Time: 05/26/15  9:00 PM  Result Value Ref Range   Color, Urine YELLOW YELLOW   APPearance CLEAR CLEAR   Specific Gravity, Urine 1.010 1.005 - 1.030   pH 7.0 5.0 - 8.0   Glucose, UA NEGATIVE NEGATIVE mg/dL   Hgb urine dipstick LARGE (A) NEGATIVE   Bilirubin Urine NEGATIVE NEGATIVE   Ketones, ur NEGATIVE NEGATIVE mg/dL   Protein, ur NEGATIVE  NEGATIVE mg/dL   Urobilinogen, UA 0.2 0.0 - 1.0 mg/dL   Nitrite NEGATIVE NEGATIVE   Leukocytes, UA NEGATIVE NEGATIVE  Urine microscopic-add on     Status: None   Collection Time: 05/26/15  9:00 PM  Result Value Ref Range   Squamous Epithelial / LPF RARE RARE   WBC, UA 0-2 <3 WBC/hpf   RBC / HPF 0-2 <3 RBC/hpf   Bacteria, UA RARE RARE  Pregnancy, urine POC     Status: None   Collection Time: 05/26/15  9:23 PM  Result Value Ref Range   Preg Test, Ur NEGATIVE NEGATIVE  Wet prep, genital     Status: Abnormal   Collection Time: 05/26/15 10:40 PM  Result Value Ref Range   Yeast Wet Prep HPF POC NONE SEEN NONE SEEN   Trich, Wet Prep NONE SEEN NONE SEEN   Clue Cells Wet Prep HPF POC FEW (A) NONE SEEN   WBC, Wet Prep HPF POC FEW (A) NONE SEEN  CBC     Status: Abnormal   Collection Time: 05/26/15 10:40 PM  Result Value Ref Range   WBC 8.0 4.0 - 10.5 K/uL    RBC 2.57 (L) 3.87 - 5.11 MIL/uL   Hemoglobin 6.4 (LL) 12.0 - 15.0 g/dL   HCT 16.1 (L) 09.6 - 04.5 %   MCV 77.4 (L) 78.0 - 100.0 fL   MCH 24.9 (L) 26.0 - 34.0 pg   MCHC 32.2 30.0 - 36.0 g/dL   RDW 40.9 (H) 81.1 - 91.4 %   Platelets 343 150 - 400 K/uL       MAU Management/MDM: Ordered labs and reviewed results.  Consult Dr Debroah Loop, reviewed assessment and labs.  Treatments in MAU included Fereheme IV x 1 dose, H/A cocktail including Reglan 10 mg IV, Benadryl 25 mg IV, and Decadron 10 mg IV. Pt stable at time of discharge.  ASSESSMENT 1. Anemia due to blood loss, acute   2. Menorrhagia with regular cycle   3. Abnormal uterine bleeding (AUB)   4. Nonintractable migraine, unspecified migraine type     PLAN Discharge home with bleeding precautions Ferrous sulfate 325 mg BID Sprintec OCPs with taper (TID x 3 days, BID x 3 days, then daily) Phenergan 12.5-25 mg PO Q 6 hours PRN nausea Fioricet Take 1-2 Q 6 hours PRN h/a   Follow-up Information    Follow up with [facility].   Specialty:  Obstetrics and Gynecology   Why:  The clinic will call you with appointment.   Contact information:   [address] [place] [address] [phone]      Follow up with [facility].   Why:  As needed for emergencies   Contact information:   [address] [id number] mc [place] [address] [phone]      Sharen Counter Certified Nurse-Midwife 05/27/2015  12:43 AM

## 2015-05-26 NOTE — MAU Note (Signed)
Courtney lab tech called with Hgb results of 6.4, results called to [employer] CNM

## 2015-05-27 DIAGNOSIS — D62 Acute posthemorrhagic anemia: Secondary | ICD-10-CM

## 2015-05-27 LAB — GC/CHLAMYDIA PROBE AMP (~~LOC~~) NOT AT ARMC
CHLAMYDIA, DNA PROBE: NEGATIVE
NEISSERIA GONORRHEA: NEGATIVE

## 2015-05-27 LAB — HIV ANTIBODY (ROUTINE TESTING W REFLEX): HIV Screen 4th Generation wRfx: NONREACTIVE

## 2015-05-27 MED ORDER — PROMETHAZINE HCL 25 MG PO TABS: 12.5000 mg | ORAL_TABLET | Freq: Four times a day (QID) | ORAL | Status: DC | PRN

## 2015-05-27 MED ORDER — BUTALBITAL-APAP-CAFFEINE 50-325-40 MG PO TABS: 1.0000 | ORAL_TABLET | Freq: Four times a day (QID) | ORAL | Status: AC | PRN

## 2015-05-27 MED ORDER — FERROUS SULFATE 325 (65 FE) MG PO TABS: 325.0000 mg | ORAL_TABLET | Freq: Two times a day (BID) | ORAL | Status: DC

## 2015-05-27 MED ORDER — NORGESTIMATE-ETH ESTRADIOL 0.25-35 MG-MCG PO TABS: ORAL_TABLET | ORAL | Status: DC

## 2015-06-18 ENCOUNTER — Telehealth: Payer: Self-pay | Admitting: *Deleted

## 2015-06-18 ENCOUNTER — Encounter: Payer: Self-pay | Admitting: Obstetrics & Gynecology

## 2015-06-18 NOTE — Telephone Encounter (Signed)
Lindsey Castillo missed a scheduled appointment for f/u from MAU for heavy bleeding. I called her and she apologized,she states she had meant to call and reschedule. She would like to reschedule and I informed her registrars would call her to reschedule.

## 2015-07-06 ENCOUNTER — Encounter: Payer: Self-pay | Admitting: Medical

## 2015-11-30 ENCOUNTER — Encounter: Payer: Self-pay | Admitting: Medical

## 2015-11-30 ENCOUNTER — Ambulatory Visit (INDEPENDENT_AMBULATORY_CARE_PROVIDER_SITE_OTHER): Payer: Self-pay | Admitting: Medical

## 2015-11-30 VITALS — BP 121/74 | HR 92 | Ht 66.0 in | Wt 145.7 lb

## 2015-11-30 DIAGNOSIS — N939 Abnormal uterine and vaginal bleeding, unspecified: Secondary | ICD-10-CM

## 2015-11-30 MED ORDER — NORGESTIMATE-ETH ESTRADIOL 0.25-35 MG-MCG PO TABS: ORAL_TABLET | ORAL | Status: DC

## 2015-11-30 NOTE — Progress Notes (Signed)
Patient ID: Lindsey Castillo, female   DOB: 07/26/1991, 25 y.o.   MRN: [id number]  History:  Lindsey Castillo is a 25 y.o. G1P1001 who presents to clinic today for heavy periods. The patient states that she has had heavy periods that became progressively longer x 1 year. She was seen in [facility] in September 2016 when she had been bleeding x 3 months. She was started on OCPs with a gradual taper. She has continued to have long periods with this regimen. She states intermittent cramping in the lower abdomen most often associated with bleeding. She takes Motrin with some relief. She is bleeding today. She is sexually active and last intercourse was 3-4 days ago, unprotected.   The following portions of the patient's history were reviewed and updated as appropriate: allergies, current medications, family history, past medical history, social history, past surgical history and problem list.  Review of Systems:  Review of Systems  Constitutional: Positive for malaise/fatigue. Negative for fever.  Respiratory: Negative for shortness of breath.   Gastrointestinal: Positive for abdominal pain.  Genitourinary:       + vaginal bleeding  Neurological: Positive for dizziness and weakness. Negative for loss of consciousness.     Objective:  Physical Exam BP 121/74 mmHg  Pulse 92  Ht 5\' 6"  (1.676 m)  Wt 145 lb 11.2 oz (66.089 kg)  BMI 23.53 kg/m2  LMP 11/28/2015 (Exact Date) Physical Exam  Constitutional: She is oriented to person, place, and time. She appears well-developed and well-nourished. No distress.  HENT:  Head: Normocephalic.  Cardiovascular: Normal rate.   Pulmonary/Chest: Effort normal.  Abdominal: Soft.  Neurological: She is alert and oriented to person, place, and time.  Skin: Skin is warm and dry. No erythema.  Psychiatric: She has a normal mood and affect.  Vitals reviewed.   Labs and Imaging CBC, TSH, LH, FSH, Prolactin, Hgb A1c today  Assessment & Plan:  Assessment: Abnormal  uterine bleding  Plans: Bleeding precautions discussed. Patient will be contacted with any abnormal labs Rx for Sprintec refilled to last until patient's follow-up appointment [facility] for UPT and if negative, will receive Depo Provera  Marny LowensteinJulie N Wenzel, PA-C 11/30/2015 2:52 PM

## 2015-11-30 NOTE — Patient Instructions (Signed)

## 2015-12-01 LAB — CBC
HCT: 36.9 % (ref 36.0–46.0)
HEMOGLOBIN: 11.7 g/dL — AB (ref 12.0–15.0)
MCH: 26.5 pg (ref 26.0–34.0)
MCHC: 31.7 g/dL (ref 30.0–36.0)
MCV: 83.7 fL (ref 78.0–100.0)
MPV: 10.3 fL (ref 8.6–12.4)
Platelets: 323 10*3/uL (ref 150–400)
RBC: 4.41 MIL/uL (ref 3.87–5.11)
RDW: 19.8 % — ABNORMAL HIGH (ref 11.5–15.5)
WBC: 7.9 10*3/uL (ref 4.0–10.5)

## 2015-12-01 LAB — FOLLICLE STIMULATING HORMONE: FSH: 8.2 m[IU]/mL

## 2015-12-01 LAB — LUTEINIZING HORMONE: LH: 7 m[IU]/mL

## 2015-12-01 LAB — HEMOGLOBIN A1C
Hgb A1c MFr Bld: 5.1 % (ref <5.7)
Mean Plasma Glucose: 100 mg/dL (ref <117)

## 2015-12-01 LAB — PROLACTIN: PROLACTIN: 6.2 ng/mL

## 2015-12-01 LAB — TSH: TSH: 1.97 mIU/L

## 2015-12-02 ENCOUNTER — Telehealth: Payer: Self-pay | Admitting: Family Medicine

## 2015-12-02 NOTE — Telephone Encounter (Signed)
Want test results

## 2015-12-04 NOTE — Telephone Encounter (Signed)
Attempted to reach patient concerning her results. Per [facility] her U/S results are normal at this time.

## 2015-12-07 ENCOUNTER — Ambulatory Visit: Payer: Self-pay

## 2015-12-09 NOTE — Telephone Encounter (Signed)
Called patient and a man answered stating the patient wasn't in. Asked him to have her call us back. He stated he would

## 2015-12-11 NOTE — Telephone Encounter (Signed)
Patient returned call, stated she has been trying to call about her test results but when we call she is working and cannot answer the phone. Please call cell number [phone].

## 2015-12-22 NOTE — Telephone Encounter (Signed)
Called pt and informed her of US results. Pt voiced understanding and stated that she wanted to reschedule her missed appt from 3/27 for Depo provera injection. She had last unprotected sex 3 days ago. Pt was advised to abstain from intercourse for 2 weeks and can have appt on 4/24. Pt also had many questions about what is causing her abnormal bleeding and what she will do in the future when she wants to have children. I advised pt to return for follow up appt 1 month after receiving Depo Provera injection to discuss further with Vonzella NippleJulie Wenzel. PA. Pt agreed.

## 2016-01-04 ENCOUNTER — Ambulatory Visit: Payer: Self-pay

## 2016-02-10 ENCOUNTER — Telehealth: Payer: Self-pay

## 2016-02-10 ENCOUNTER — Ambulatory Visit: Payer: Self-pay | Admitting: Medical

## 2016-02-10 NOTE — Telephone Encounter (Signed)
This patient is down on Lindsey Castillo scheduled to discuss depo or received depo. I have left her message to see why she is coming in for appt today.

## 2016-02-10 NOTE — Telephone Encounter (Signed)
Pt did not come to appt and has not call back concerning her appt.

## 2016-08-16 ENCOUNTER — Emergency Department (HOSPITAL_COMMUNITY)
Admission: EM | Admit: 2016-08-16 | Discharge: 2016-08-16 | Disposition: A | Discharge status: Home / Self Care | Payer: Self-pay | Attending: Emergency Medicine | Admitting: Emergency Medicine

## 2016-08-16 ENCOUNTER — Encounter (HOSPITAL_COMMUNITY): Payer: Self-pay | Admitting: Emergency Medicine

## 2016-08-16 DIAGNOSIS — J029 Acute pharyngitis, unspecified: Secondary | ICD-10-CM | POA: Insufficient documentation

## 2016-08-16 DIAGNOSIS — F17210 Nicotine dependence, cigarettes, uncomplicated: Secondary | ICD-10-CM | POA: Insufficient documentation

## 2016-08-16 DIAGNOSIS — J45909 Unspecified asthma, uncomplicated: Secondary | ICD-10-CM | POA: Insufficient documentation

## 2016-08-16 DIAGNOSIS — Z79899 Other long term (current) drug therapy: Secondary | ICD-10-CM | POA: Insufficient documentation

## 2016-08-16 LAB — MONONUCLEOSIS SCREEN: MONO SCREEN: NEGATIVE

## 2016-08-16 LAB — RAPID STREP SCREEN (MED CTR MEBANE ONLY): Streptococcus, Group A Screen (Direct): NEGATIVE

## 2016-08-16 MED ORDER — DEXAMETHASONE SODIUM PHOSPHATE 10 MG/ML IJ SOLN
10.0000 mg | Freq: Once | INTRAMUSCULAR | Status: AC
  Administered 2016-08-16: 10 mg via INTRAMUSCULAR
  Filled 2016-08-16: qty 1

## 2016-08-16 MED ORDER — IBUPROFEN 600 MG PO TABS: 600.0000 mg | ORAL_TABLET | Freq: Four times a day (QID) | ORAL | 0 refills | Status: DC | PRN

## 2016-08-16 MED ORDER — OXYMETAZOLINE HCL 0.05 % NA SOLN: 1.0000 | Freq: Two times a day (BID) | NASAL | 0 refills | Status: DC

## 2016-08-16 NOTE — Discharge Instructions (Signed)
Take your medications as prescribed. Continue drinking fluids at home to remain hydrated. He may also take Tylenol as prescribed over-the-counter as needed for additional pain relief. Follow-up with your primary care provider within the next 4-5 days if her symptoms have not improved. Please return to the Emergency Department if symptoms worsen or new onset of fever, headache, neck stiffness, facial/neck swelling, unable to swallow resulting in drooling, muffled voice, sensation of throat closing, difficulty breathing, vomiting, unable to keep fluids down.

## 2016-08-16 NOTE — ED Notes (Signed)
Patient is A&Ox4 at this time.  Patient in no signs of distress.  Please see providers note for complete history and physical exam.  

## 2016-08-16 NOTE — ED Provider Notes (Signed)
[facility]-EMERGENCY DEPT Provider Note   CSN: [id number] Arrival date & time: 08/16/16  1733  By signing my name below, I, Lindsey Castillo, attest that this documentation has been prepared under the direction and in the presence of non-physician practitioner, Melburn Hake, PA-C. Electronically Signed: Majel Castillo, Scribe. 08/16/2016. 6:25 PM.  History   Chief Complaint Chief Complaint  Patient presents with  . Sore Throat   The history is provided by the patient. No language interpreter was used.   HPI Comments: Lindsey Castillo is a 25 y.o. female who presents to the Emergency Department complaining of gradually worsening, sore throat and cough productive of yellow mucous that began several months ago and worsened this morning. Pt reports associated pain when swallowing, pain when opening her jaw, increased hoarseness of her voice, sensation of throat swelling, "tart taste in her mouth," sinus pressure, and fever (TMAX 101). She notes hx of asthma and states she has also been experiencing mild intermittent shortness of breath that is relieved with her albuterol inhaler; consistent with her typical asthma sxs. She states she took Tylenol, Theraflu and Motrin to relieve her symptoms with mild relief. Pt reports she has not seen anyone previously for her symptoms as she does not currently have insurance. She denies ear pain, facial swelling, chest pain, abdominal pain, nausea, vomiting, chills and generalized myalgias.    Past Medical History:  Diagnosis Date  . Abnormal Pap smear    last pap 07/2012  . Asthma    USES INHALER  . Eczema   . Fracture, ankle AGE 52  . Headache(784.0)   . Infection    trich  . Infection    BV  . Infection    HX OF FREQUENT  . NSVD (normal spontaneous vaginal delivery) 01/01/2013   SVD at 1525  . Seasonal allergies     Patient Active Problem List   Diagnosis Date Noted  . NSVD (normal spontaneous vaginal delivery) 01/01/2013  . Asthma 08/02/2012  . Eczema  08/02/2012    Past Surgical History:  Procedure Laterality Date  . FOOT SURGERY      OB History    Gravida Para Term Preterm AB Living   1 1 1     1    SAB TAB Ectopic Multiple Live Births           1     Home Medications    Prior to Admission medications   Medication Sig Start Date End Date Taking? Authorizing Provider  albuterol (PROVENTIL) (5 MG/ML) 0.5% nebulizer solution Take 0.5 mLs (2.5 mg total) by nebulization every 6 (six) hours as needed. Patient not taking: Reported on 11/30/2015 10/18/12   Silverio Lay, MD  ferrous sulfate (FERROUSUL) 325 (65 FE) MG tablet Take 1 tablet (325 mg total) by mouth 2 (two) times daily. 05/27/15   Lisa A Leftwich-Kirby, CNM  ibuprofen (ADVIL,MOTRIN) 600 MG tablet Take 1 tablet (600 mg total) by mouth every 6 (six) hours as needed. 08/16/16   Barrett Henle, PA-C  norgestimate-ethinyl estradiol (ORTHO-CYCLEN,SPRINTEC,PREVIFEM) 0.25-35 MG-MCG tablet Take 3 tablets/day for 3 days, then 2 tablets/day for 3 days, then 1 tablet daily until all the tablets with medicine are completed.  Do not take the placebo pills at the end of the pack, instead start a new pack of pills, taking 1 pill daily. 11/30/15   Marny Lowenstein, PA-C  oxymetazoline (AFRIN NASAL SPRAY) 0.05 % nasal spray Place 1 spray into both nostrils 2 (two) times daily. Spray once into each  nostril twice daily for up to the next 3 days. Do not use for more than 3 days to prevent rebound rhinorrhea. 08/16/16   Barrett HenleNicole Elizabeth Linda Grimmer, PA-C  promethazine (PHENERGAN) 25 MG tablet Take 0.5-1 tablets (12.5-25 mg total) by mouth every 6 (six) hours as needed for nausea. Patient not taking: Reported on 11/30/2015 05/27/15   Wilmer FloorLisa A Leftwich-Kirby, CNM  triamcinolone cream (KENALOG) 0.1 % Apply 1 application topically 3 (three) times daily as needed (eczema). Reported on 11/30/2015    Historical Provider, MD    Family History Family History  Problem Relation Age of Onset  . Hypertension Father   .  Cancer Maternal Grandmother   . Diabetes Maternal Grandmother   . Depression Mother   . Birth defects Sister     HEART MURMUR  . Asthma Brother   . Miscarriages / [place] Cousin   . Thyroid disease Sister     Social History Social History  Substance Use Topics  . Smoking status: Current Every Day Smoker    Types: Cigarettes    Last attempt to quit: 05/24/2012  . Smokeless tobacco: Never Used  . Alcohol use 0.0 oz/week     Allergies   Coconut oil   Review of Systems Review of Systems  Constitutional: Positive for fever. Negative for chills.  HENT: Positive for sinus pressure, sore throat, trouble swallowing and voice change. Negative for ear pain and facial swelling.   Respiratory: Positive for cough and shortness of breath.   Cardiovascular: Negative for chest pain.  Gastrointestinal: Negative for abdominal pain, nausea and vomiting.  Musculoskeletal: Negative for myalgias.   Physical Exam Updated Vital Signs BP 128/82 (BP Location: Right Arm)   Pulse 92   Temp 98.4 F (36.9 C) (Oral)   Resp 18   SpO2 100%   Physical Exam  Constitutional: She is oriented to person, place, and time. She appears well-developed and well-nourished.  HENT:  Head: Normocephalic and atraumatic.  Right Ear: A middle ear effusion is present.  Left Ear: A middle ear effusion is present.  Nose: Rhinorrhea present. Right sinus exhibits maxillary sinus tenderness. Right sinus exhibits no frontal sinus tenderness. Left sinus exhibits maxillary sinus tenderness. Left sinus exhibits no frontal sinus tenderness.  Mouth/Throat: Mucous membranes are normal. No trismus in the jaw. Normal dentition. No uvula swelling or dental caries. Posterior oropharyngeal erythema present. No tonsillar abscesses. Tonsils are 2+ on the right. Tonsils are 2+ on the left. Tonsillar exudate.  Moderate erythema and swelling with white exudate to bilateral tonsils. Uvula midline. Floor of mouth soft. Pt tolerating  secretions. No trismus or drooling. No muffled voice. No facial swelling.   Eyes: Conjunctivae and EOM are normal. Right eye exhibits no discharge. Left eye exhibits no discharge. No scleral icterus.  Neck: Normal range of motion. Neck supple.  +bilateral submandibular lymphadenopathy. No neck swelling.   Cardiovascular: Normal rate, regular rhythm, normal heart sounds and intact distal pulses.   Pulmonary/Chest: Effort normal and breath sounds normal. No respiratory distress. She has no wheezes. She has no rales. She exhibits no tenderness.  Abdominal: Soft. Bowel sounds are normal. She exhibits no distension and no mass. There is no tenderness. There is no rebound and no guarding. No hernia.  Musculoskeletal: She exhibits no edema.  Lymphadenopathy:    She has cervical adenopathy.  Neurological: She is alert and oriented to person, place, and time.  Skin: Skin is warm and dry.  Nursing note and vitals reviewed.  ED Treatments / Results  Labs (all labs ordered are listed, but only abnormal results are displayed) Labs Reviewed  RAPID STREP SCREEN (NOT AT [facility])  CULTURE, GROUP A STREP [facility]  MONONUCLEOSIS SCREEN    EKG  EKG Interpretation None       Radiology No results found.  Procedures Procedures (including critical care time)  Medications Ordered in ED Medications  dexamethasone (DECADRON) injection 10 mg (10 mg Intramuscular Given 08/16/16 1841)    DIAGNOSTIC STUDIES:  Oxygen Saturation is 100% on RA, normal by my interpretation.    COORDINATION OF CARE:  6:19 PM Discussed treatment plan with pt at bedside and pt agreed to plan.  Initial Impression / Assessment and Plan / ED Course  I have reviewed the triage vital signs and the nursing notes.  Pertinent labs & imaging results that were available during my care of the patient were reviewed by me and considered in my medical decision making (see chart for details).  Clinical Course     Pt afebrile with  tonsillar exudate, cervical lymphadenopathy, & dysphagia. Negative strep. Negative mono. Treated in the ED with steroids. Suspect viral pharyngitis. Pt appears well hydrated, discussed importance of continued water rehydration. Presentation non concerning for PTA or infxn spread to soft tissue. No trismus or uvula deviation. Specific return precautions discussed. Pt able to drink water in ED without difficulty with intact air way. Recommended PCP follow up.    I personally performed the services described in this documentation, which was scribed in my presence. The recorded information has been reviewed and is accurate.   Final Clinical Impressions(s) / ED Diagnoses   Final diagnoses:  Viral pharyngitis    New Prescriptions Discharge Medication List as of 08/16/2016  8:58 PM    START taking these medications   Details  oxymetazoline (AFRIN NASAL SPRAY) 0.05 % nasal spray Place 1 spray into both nostrils 2 (two) times daily. Spray once into each nostril twice daily for up to the next 3 days. Do not use for more than 3 days to prevent rebound rhinorrhea., Starting Tue 08/16/2016, Print         Satira Sarkicole Elizabeth BryceNadeau, [place]PA-C 08/16/16 [id number]    Alvira MondayErin Schlossman, MD 08/17/16 1309

## 2016-08-16 NOTE — ED Notes (Signed)
Patient Alert and oriented X4. Stable and ambulatory. Patient verbalized understanding of the discharge instructions.  Patient belongings were taken by the patient.  

## 2016-08-16 NOTE — ED Triage Notes (Signed)
Pt here for sore throat x several weeks worse today

## 2016-08-19 LAB — CULTURE, GROUP A STREP (THRC)

## 2016-09-12 ENCOUNTER — Encounter: Payer: Self-pay | Admitting: Emergency Medicine

## 2016-09-12 ENCOUNTER — Emergency Department (HOSPITAL_COMMUNITY): Payer: Self-pay

## 2016-09-12 ENCOUNTER — Inpatient Hospital Stay (HOSPITAL_COMMUNITY)
Admission: EM | Admit: 2016-09-12 | Discharge: 2016-09-15 | DRG: 392 | Disposition: A | Discharge status: Home / Self Care | Payer: Self-pay | Attending: Internal Medicine | Admitting: Internal Medicine

## 2016-09-12 DIAGNOSIS — Z720 Tobacco use: Secondary | ICD-10-CM

## 2016-09-12 DIAGNOSIS — Z79899 Other long term (current) drug therapy: Secondary | ICD-10-CM

## 2016-09-12 DIAGNOSIS — F17210 Nicotine dependence, cigarettes, uncomplicated: Secondary | ICD-10-CM | POA: Diagnosis present

## 2016-09-12 DIAGNOSIS — D72829 Elevated white blood cell count, unspecified: Secondary | ICD-10-CM

## 2016-09-12 DIAGNOSIS — R110 Nausea: Secondary | ICD-10-CM

## 2016-09-12 DIAGNOSIS — K529 Noninfective gastroenteritis and colitis, unspecified: Principal | ICD-10-CM | POA: Diagnosis present

## 2016-09-12 DIAGNOSIS — Z91018 Allergy to other foods: Secondary | ICD-10-CM

## 2016-09-12 DIAGNOSIS — N83519 Torsion of ovary and ovarian pedicle, unspecified side: Secondary | ICD-10-CM

## 2016-09-12 DIAGNOSIS — R6510 Systemic inflammatory response syndrome (SIRS) of non-infectious origin without acute organ dysfunction: Secondary | ICD-10-CM | POA: Diagnosis present

## 2016-09-12 DIAGNOSIS — N926 Irregular menstruation, unspecified: Secondary | ICD-10-CM | POA: Diagnosis present

## 2016-09-12 DIAGNOSIS — N739 Female pelvic inflammatory disease, unspecified: Secondary | ICD-10-CM

## 2016-09-12 DIAGNOSIS — A549 Gonococcal infection, unspecified: Secondary | ICD-10-CM | POA: Diagnosis present

## 2016-09-12 DIAGNOSIS — N8311 Corpus luteum cyst of right ovary: Secondary | ICD-10-CM | POA: Diagnosis present

## 2016-09-12 DIAGNOSIS — R1031 Right lower quadrant pain: Secondary | ICD-10-CM | POA: Diagnosis present

## 2016-09-12 LAB — URINALYSIS, ROUTINE W REFLEX MICROSCOPIC
Bilirubin Urine: NEGATIVE
Glucose, UA: NEGATIVE mg/dL
Ketones, ur: 5 mg/dL — AB
Leukocytes, UA: NEGATIVE
NITRITE: NEGATIVE
PROTEIN: NEGATIVE mg/dL
SPECIFIC GRAVITY, URINE: 1.013 (ref 1.005–1.030)
pH: 7 (ref 5.0–8.0)

## 2016-09-12 LAB — CBC WITH DIFFERENTIAL/PLATELET
BASOS PCT: 0 %
Basophils Absolute: 0 10*3/uL (ref 0.0–0.1)
Eosinophils Absolute: 0.1 10*3/uL (ref 0.0–0.7)
Eosinophils Relative: 1 %
HEMATOCRIT: 35.9 % — AB (ref 36.0–46.0)
Hemoglobin: 12.1 g/dL (ref 12.0–15.0)
LYMPHS ABS: 2.2 10*3/uL (ref 0.7–4.0)
LYMPHS PCT: 12 %
MCH: 27.3 pg (ref 26.0–34.0)
MCHC: 33.7 g/dL (ref 30.0–36.0)
MCV: 80.9 fL (ref 78.0–100.0)
MONO ABS: 0.7 10*3/uL (ref 0.1–1.0)
MONOS PCT: 4 %
NEUTROS ABS: 15 10*3/uL — AB (ref 1.7–7.7)
NEUTROS PCT: 83 %
Platelets: 392 10*3/uL (ref 150–400)
RBC: 4.44 MIL/uL (ref 3.87–5.11)
RDW: 17.9 % — AB (ref 11.5–15.5)
WBC: 18 10*3/uL — ABNORMAL HIGH (ref 4.0–10.5)

## 2016-09-12 LAB — COMPREHENSIVE METABOLIC PANEL
ALBUMIN: 3.8 g/dL (ref 3.5–5.0)
ALT: 9 U/L — ABNORMAL LOW (ref 14–54)
ANION GAP: 10 (ref 5–15)
AST: 14 U/L — AB (ref 15–41)
Alkaline Phosphatase: 61 U/L (ref 38–126)
CO2: 25 mmol/L (ref 22–32)
Calcium: 9.1 mg/dL (ref 8.9–10.3)
Chloride: 104 mmol/L (ref 101–111)
Creatinine, Ser: 0.84 mg/dL (ref 0.44–1.00)
GFR calc Af Amer: >60 mL/min (ref >60)
GFR calc non Af Amer: >60 mL/min (ref >60)
GLUCOSE: 89 mg/dL (ref 65–99)
Potassium: 3.7 mmol/L (ref 3.5–5.1)
SODIUM: 139 mmol/L (ref 135–145)
TOTAL PROTEIN: 8 g/dL (ref 6.5–8.1)
Total Bilirubin: 0.4 mg/dL (ref 0.3–1.2)

## 2016-09-12 LAB — LIPASE, BLOOD: Lipase: 17 U/L (ref 11–51)

## 2016-09-12 LAB — WET PREP, GENITAL
Clue Cells Wet Prep HPF POC: NONE SEEN
SPERM: NONE SEEN
Trich, Wet Prep: NONE SEEN
YEAST WET PREP: NONE SEEN

## 2016-09-12 LAB — LACTIC ACID, PLASMA
Lactic Acid, Venous: 0.8 mmol/L (ref 0.5–1.9)
Lactic Acid, Venous: 0.8 mmol/L (ref 0.5–1.9)

## 2016-09-12 LAB — I-STAT BETA HCG BLOOD, ED (MC, WL, AP ONLY)

## 2016-09-12 MED ORDER — HYDROMORPHONE HCL 2 MG/ML IJ SOLN: 1.0000 mg | INTRAMUSCULAR | Status: AC | PRN

## 2016-09-12 MED ORDER — MORPHINE SULFATE (PF) 4 MG/ML IV SOLN
4.0000 mg | Freq: Once | INTRAVENOUS | Status: AC
  Administered 2016-09-12: 4 mg via INTRAVENOUS
  Filled 2016-09-12: qty 1

## 2016-09-12 MED ORDER — CEFTRIAXONE SODIUM 250 MG IJ SOLR
250.0000 mg | Freq: Once | INTRAMUSCULAR | Status: AC
  Administered 2016-09-12: 250 mg via INTRAMUSCULAR
  Filled 2016-09-12: qty 250

## 2016-09-12 MED ORDER — SODIUM CHLORIDE 0.9 % IV BOLUS (SEPSIS)
1000.0000 mL | Freq: Once | INTRAVENOUS | Status: AC
  Administered 2016-09-12: 1000 mL via INTRAVENOUS

## 2016-09-12 MED ORDER — TRAMADOL-ACETAMINOPHEN 37.5-325 MG PO TABS
2.0000 | ORAL_TABLET | Freq: Four times a day (QID) | ORAL | Status: DC | PRN
  Administered 2016-09-12 – 2016-09-14 (×5): 2 via ORAL
  Filled 2016-09-12 (×5): qty 2

## 2016-09-12 MED ORDER — NICOTINE 21 MG/24HR TD PT24
21.0000 mg | MEDICATED_PATCH | Freq: Every day | TRANSDERMAL | Status: DC
  Administered 2016-09-13 – 2016-09-15 (×3): 21 mg via TRANSDERMAL
  Filled 2016-09-12 (×3): qty 1

## 2016-09-12 MED ORDER — SODIUM CHLORIDE 0.9 % IV SOLN
INTRAVENOUS | Status: AC
  Administered 2016-09-12: 19:00:00 via INTRAVENOUS

## 2016-09-12 MED ORDER — HYDROMORPHONE HCL 2 MG/ML IJ SOLN
0.5000 mg | Freq: Once | INTRAMUSCULAR | Status: AC
  Administered 2016-09-12: 0.5 mg via INTRAVENOUS
  Filled 2016-09-12: qty 1

## 2016-09-12 MED ORDER — PIPERACILLIN-TAZOBACTAM 3.375 G IVPB 30 MIN
3.3750 g | Freq: Once | INTRAVENOUS | Status: DC
  Filled 2016-09-12: qty 50

## 2016-09-12 MED ORDER — ONDANSETRON HCL 4 MG/2ML IJ SOLN: 4.0000 mg | Freq: Three times a day (TID) | INTRAMUSCULAR | Status: AC | PRN

## 2016-09-12 MED ORDER — NICOTINE 21 MG/24HR TD PT24
21.0000 mg | MEDICATED_PATCH | Freq: Every day | TRANSDERMAL | Status: DC
  Administered 2016-09-12: 21 mg via TRANSDERMAL
  Filled 2016-09-12: qty 1

## 2016-09-12 MED ORDER — PIPERACILLIN-TAZOBACTAM 3.375 G IVPB: 3.3750 g | Freq: Three times a day (TID) | INTRAVENOUS | Status: DC

## 2016-09-12 MED ORDER — ACETAMINOPHEN 325 MG PO TABS: 650.0000 mg | ORAL_TABLET | Freq: Four times a day (QID) | ORAL | Status: DC | PRN

## 2016-09-12 MED ORDER — MORPHINE SULFATE (PF) 2 MG/ML IV SOLN
2.0000 mg | INTRAVENOUS | Status: DC | PRN
  Administered 2016-09-12: 2 mg via INTRAVENOUS
  Filled 2016-09-12: qty 1

## 2016-09-12 MED ORDER — ONDANSETRON HCL 4 MG/2ML IJ SOLN
4.0000 mg | Freq: Four times a day (QID) | INTRAMUSCULAR | Status: DC | PRN
  Administered 2016-09-13: 4 mg via INTRAVENOUS
  Filled 2016-09-12: qty 2

## 2016-09-12 MED ORDER — KETOROLAC TROMETHAMINE 15 MG/ML IJ SOLN
15.0000 mg | Freq: Four times a day (QID) | INTRAMUSCULAR | Status: DC | PRN
  Administered 2016-09-12 – 2016-09-14 (×5): 15 mg via INTRAVENOUS
  Filled 2016-09-12 (×5): qty 1

## 2016-09-12 MED ORDER — LIDOCAINE HCL (PF) 1 % IJ SOLN
INTRAMUSCULAR | Status: AC
  Administered 2016-09-12: 5 mL
  Filled 2016-09-12: qty 5

## 2016-09-12 MED ORDER — IOPAMIDOL (ISOVUE-300) INJECTION 61%
INTRAVENOUS | Status: AC
  Administered 2016-09-12: 100 mL
  Filled 2016-09-12: qty 100

## 2016-09-12 MED ORDER — ALBUTEROL SULFATE (2.5 MG/3ML) 0.083% IN NEBU: 2.5000 mg | INHALATION_SOLUTION | Freq: Four times a day (QID) | RESPIRATORY_TRACT | Status: DC | PRN

## 2016-09-12 MED ORDER — POLYETHYLENE GLYCOL 3350 17 G PO PACK
17.0000 g | PACK | Freq: Every day | ORAL | Status: DC
  Administered 2016-09-12 – 2016-09-14 (×3): 17 g via ORAL
  Filled 2016-09-12 (×3): qty 1

## 2016-09-12 MED ORDER — PIPERACILLIN-TAZOBACTAM 3.375 G IVPB 30 MIN
3.3750 g | Freq: Once | INTRAVENOUS | Status: AC
  Administered 2016-09-12: 3.375 g via INTRAVENOUS
  Filled 2016-09-12: qty 50

## 2016-09-12 MED ORDER — AZITHROMYCIN 250 MG PO TABS
1000.0000 mg | ORAL_TABLET | Freq: Once | ORAL | Status: AC
  Administered 2016-09-12: 1000 mg via ORAL
  Filled 2016-09-12: qty 4

## 2016-09-12 MED ORDER — DOXYCYCLINE HYCLATE 100 MG PO TABS
100.0000 mg | ORAL_TABLET | Freq: Two times a day (BID) | ORAL | Status: DC
  Administered 2016-09-12 – 2016-09-15 (×6): 100 mg via ORAL
  Filled 2016-09-12 (×6): qty 1

## 2016-09-12 MED ORDER — ONDANSETRON HCL 4 MG PO TABS: 4.0000 mg | ORAL_TABLET | Freq: Four times a day (QID) | ORAL | Status: DC | PRN

## 2016-09-12 MED ORDER — ONDANSETRON HCL 4 MG/2ML IJ SOLN
4.0000 mg | Freq: Once | INTRAMUSCULAR | Status: AC
  Administered 2016-09-12: 4 mg via INTRAVENOUS
  Filled 2016-09-12: qty 2

## 2016-09-12 MED ORDER — ACETAMINOPHEN 650 MG RE SUPP: 650.0000 mg | Freq: Four times a day (QID) | RECTAL | Status: DC | PRN

## 2016-09-12 MED ORDER — SODIUM CHLORIDE 0.9 % IV SOLN
INTRAVENOUS | Status: DC
  Administered 2016-09-13 – 2016-09-15 (×7): via INTRAVENOUS

## 2016-09-12 MED ORDER — ENOXAPARIN SODIUM 40 MG/0.4ML ~~LOC~~ SOLN
40.0000 mg | SUBCUTANEOUS | Status: DC
  Administered 2016-09-12 – 2016-09-14 (×3): 40 mg via SUBCUTANEOUS
  Filled 2016-09-12 (×3): qty 0.4

## 2016-09-12 MED ORDER — PIPERACILLIN-TAZOBACTAM 3.375 G IVPB
3.3750 g | Freq: Three times a day (TID) | INTRAVENOUS | Status: DC
  Administered 2016-09-13 – 2016-09-14 (×4): 3.375 g via INTRAVENOUS
  Filled 2016-09-12 (×5): qty 50

## 2016-09-12 MED ORDER — MORPHINE SULFATE (PF) 2 MG/ML IV SOLN
2.0000 mg | INTRAVENOUS | Status: DC | PRN
  Administered 2016-09-13 – 2016-09-14 (×4): 2 mg via INTRAVENOUS
  Filled 2016-09-12 (×4): qty 1

## 2016-09-12 MED ORDER — PIPERACILLIN-TAZOBACTAM 3.375 G IVPB
3.3750 g | Freq: Three times a day (TID) | INTRAVENOUS | Status: DC
  Filled 2016-09-12: qty 50

## 2016-09-12 NOTE — H&P (Signed)
TRH H&P   Patient Demographics:    Aneesah Hernan, is a 26 y.o. female  MRN: [id number]   DOB - 1991-06-09  Admit Date - 09/12/2016  Outpatient Primary MD for the patient is Dorrene German, MD  Referring MD: Cathren Laine, MD  Outpatient Specialists: NONE  Patient coming from: home  Chief Complaint  Patient presents with  . Abdominal Pain      HPI:    Lillybeth Tal  is a 26 y.o. female, with past medical hx of asthma, hx of trichomonas at age of 16, active smoker Who presented to the ED with persistent right lower quadrant abdominal pain for the past 1 week. She had pain involving the epigastric and umbilical area starting 3 weeks back. About 2 days after the onset of symptoms he has her menstrual period. The pain was consistent and cramping and then until about a week back it relocated to the right lower quadrant and was localized to the area. The pain was 10/10 in severity, crampy in nature, without radiation, aggravated on minimal exertion including urination, attempting to have bowel movement or passing flatus. Pain would mildly improved with ibuprofen. Patient reports having some mucoid vaginal discharge without any bleeding or pus and non-foul-smelling. She denies any fever, chills, occasional nausea but no vomiting, denies diarrhea but reports being constipated and having to strain to have a bowel movement. She denies any dysuria, loss of appetite, headache, blurred vision, chest pain, shortness of breath, palpitations, joint pains or muscle aches. Denies any lymph node swelling and rash or weight loss. She denies being on any new medications, sick contact or recent travel. Denies any history of gonorrhea or chlamydia. Reports having a single partner for past 2 years. Denies prior similar history or family history of inflammatory bowel disease.  Course in the ED Patient was  tachycardic and mildly tachypneic. Blood work showed significant leukocytosis with WBC of 18k) predominant neutrophils), normal hemoglobin and platelets. Chemistry was unremarkable including normal LFTs. UA showed small hemoglobin and rare bacteria. Beta-hCG was negative. Lipase was normal. Wet prep showed numerous WBC.  CT of the abdomen and pelvis with contrast showed area of inflammation in the right pelvis involving ileum, cecum and rectum with complex right tubo-ovarian structure (questionable for PID with tubo-ovarian abscess.) Unlikely to be acute appendicitis. Pelvic/transvaginal ultrasound with Doppler was done which was negative for ovarian torsion and showed complex lesion within the right ovary possibly representing hemorrhagic cyst versus corpus luteal cyst and unlikely for tuboovarian abscess.  Labs sent for [facility] probe and HIV.   Patient received 2 L IV normal saline bolus, IV Zofran, 4 mg IV morphine 2 followed by Dilaudid 1 mg and then 0.5 mg. She was then given IV Rocephin and azithromycin. Abdominal pain and tachycardia somewhat improved with fluids and pain medications. Hospitalist consulted for observation on medical floor.  Review of systems:    In addition to the HPI above,  No Fever-chills, No Headache, No changes with Vision or hearing, No problems swallowing food or Liquids, No Chest pain, Cough or Shortness of Breath, Abdominal pain, nausea, no Vommitting, constipated  No Blood in stool or Urine, No dysuria, vaginal discharge No new skin rashes or bruises, No new joints pains-aches,  No new weakness, tingling, numbness in any extremity, No recent weight gain or loss, No polyuria, polydypsia or polyphagia, No significant Mental Stressors.  A full 10 point Review of Systems was done, except as stated above, all other Review of Systems were negative.   With Past History of the following :    Past Medical History:  Diagnosis Date  . Abnormal Pap smear     last pap 07/2012  . Asthma    USES INHALER  . Eczema   . Fracture, ankle AGE 3  . Headache(784.0)   . Infection    trich  . Infection    BV  . Infection    HX OF FREQUENT  . NSVD (normal spontaneous vaginal delivery) 01/01/2013   SVD at 1525  . Seasonal allergies       Past Surgical History:  Procedure Laterality Date  . FOOT SURGERY        Social History:     Social History  Substance Use Topics  . Smoking status: Current Every Day Smoker    Types: Cigarettes    Last attempt to quit: 05/24/2012  . Smokeless tobacco: Never Used  . Alcohol use 0.0 oz/week     Lives - At home  Mobility - independent     Family History :     Family History  Problem Relation Age of Onset  . Hypertension Father   . Cancer Maternal Grandmother   . Diabetes Maternal Grandmother   . Depression Mother   . Birth defects Sister     HEART MURMUR  . Asthma Brother   . Miscarriages / [place] Cousin   . Thyroid disease Sister       Home Medications:   Prior to Admission medications   Medication Sig Start Date End Date Taking? Authorizing Provider  albuterol (PROVENTIL) (5 MG/ML) 0.5% nebulizer solution Take 0.5 mLs (2.5 mg total) by nebulization every 6 (six) hours as needed. 10/18/12  Yes Silverio Lay, MD  ibuprofen (ADVIL,MOTRIN) 600 MG tablet Take 1 tablet (600 mg total) by mouth every 6 (six) hours as needed. 08/16/16  Yes Barrett Henle, PA-C  ferrous sulfate (FERROUSUL) 325 (65 FE) MG tablet Take 1 tablet (325 mg total) by mouth 2 (two) times daily. Patient not taking: Reported on 09/12/2016 05/27/15   Wilmer Floor Leftwich-Kirby, CNM  norgestimate-ethinyl estradiol (ORTHO-CYCLEN,SPRINTEC,PREVIFEM) 0.25-35 MG-MCG tablet Take 3 tablets/day for 3 days, then 2 tablets/day for 3 days, then 1 tablet daily until all the tablets with medicine are completed.  Do not take the placebo pills at the end of the pack, instead start a new pack of pills, taking 1 pill daily. Patient not  taking: Reported on 09/12/2016 11/30/15   Marny Lowenstein, PA-C  oxymetazoline (AFRIN NASAL SPRAY) 0.05 % nasal spray Place 1 spray into both nostrils 2 (two) times daily. Spray once into each nostril twice daily for up to the next 3 days. Do not use for more than 3 days to prevent rebound rhinorrhea. Patient not taking: Reported on 09/12/2016 08/16/16   Barrett Henle, PA-C  promethazine (PHENERGAN) 25 MG tablet Take 0.5-1  tablets (12.5-25 mg total) by mouth every 6 (six) hours as needed for nausea. Patient not taking: Reported on 11/30/2015 05/27/15   Hurshel Party, CNM     Allergies:     Allergies  Allergen Reactions  . Coconut Oil Itching and Swelling     Physical Exam:   Vitals  Blood pressure 134/91, pulse 103, temperature 98.9 F (37.2 C), temperature source Oral, resp. rate 17, height 5\' 6"  (1.676 m), weight 61.7 kg (136 lb), last menstrual period 08/26/2016, SpO2 100 %.   General: Young female lying in bed not in distress HEENT: No pallor, no icterus, moist mucosa, supple neck chest: Clear to auscultation bilaterally CVS: S1 and S2 tachycardic, no murmurs rub or gallop GI: Soft, nondistended, right lower quadrant tenderness, mild right CVA tenderness, bowel sounds present. Pelvic exam done by ED physician showed cervical motion tenderness and some mucus discharge with right adnexal tenderness. Musculoskeletal: Warm, no edema CNS: Alert and oriented     Data Review:    CBC  Recent Labs Lab 09/12/16 1251  WBC 18.0*  HGB 12.1  HCT 35.9*  PLT 392  MCV 80.9  MCH 27.3  MCHC 33.7  RDW 17.9*  LYMPHSABS 2.2  MONOABS 0.7  EOSABS 0.1  BASOSABS 0.0   ------------------------------------------------------------------------------------------------------------------  Chemistries   Recent Labs Lab 09/12/16 1251  NA 139  K 3.7  CL 104  CO2 25  GLUCOSE 89  BUN <5*  CREATININE 0.84  CALCIUM 9.1  AST 14*  ALT 9*  ALKPHOS 61  BILITOT 0.4    ------------------------------------------------------------------------------------------------------------------ estimated creatinine clearance is 95.8 mL/min (by C-G formula based on SCr of 0.84 mg/dL). ------------------------------------------------------------------------------------------------------------------ No results for input(s): TSH, T4TOTAL, T3FREE, THYROIDAB in the last 72 hours.  Invalid input(s): FREET3  Coagulation profile No results for input(s): INR, PROTIME in the last 168 hours. ------------------------------------------------------------------------------------------------------------------- No results for input(s): DDIMER in the last 72 hours. -------------------------------------------------------------------------------------------------------------------  Cardiac Enzymes No results for input(s): CKMB, TROPONINI, MYOGLOBIN in the last 168 hours.  Invalid input(s): CK ------------------------------------------------------------------------------------------------------------------ No results found for: BNP   ---------------------------------------------------------------------------------------------------------------  Urinalysis    Component Value Date/Time   COLORURINE YELLOW 09/12/2016 1243   APPEARANCEUR CLEAR 09/12/2016 1243   LABSPEC 1.013 09/12/2016 1243   PHURINE 7.0 09/12/2016 1243   GLUCOSEU NEGATIVE 09/12/2016 1243   HGBUR SMALL (A) 09/12/2016 1243   BILIRUBINUR NEGATIVE 09/12/2016 1243   KETONESUR 5 (A) 09/12/2016 1243   PROTEINUR NEGATIVE 09/12/2016 1243   UROBILINOGEN 0.2 05/26/2015 2100   NITRITE NEGATIVE 09/12/2016 1243   LEUKOCYTESUR NEGATIVE 09/12/2016 1243    ----------------------------------------------------------------------------------------------------------------   Imaging Results:    US Transvaginal Non-ob  Result Date: 09/12/2016 CLINICAL DATA:  RIGHT lower quadrant pain since 08/24/2016. Elevated white blood cell  count. EXAM: TRANSABDOMINAL AND TRANSVAGINAL ULTRASOUND OF PELVIS DOPPLER ULTRASOUND OF OVARIES TECHNIQUE: Both transabdominal and transvaginal ultrasound examinations of the pelvis were performed. Transabdominal technique was performed for global imaging of the pelvis including uterus, ovaries, adnexal regions, and pelvic cul-de-sac. It was necessary to proceed with endovaginal exam following the transabdominal exam to visualize the ovaries. Color and duplex Doppler ultrasound was utilized to evaluate blood flow to the ovaries. COMPARISON:  CT 09/12/2016 FINDINGS: Uterus Measurements: Normal in size at 7.3 x 3.85 5.1 cm. No fibroids or other mass visualized. Endometrium Thickness: Normal in thickness at 6 mm. No focal abnormality visualized. Right ovary Measurements: Normal size at 4.654.4 x 3.8 cm. There is a complex hypoechoic 2.7 cm rounded lesion within the RIGHT ovary. Left  ovary Measurements: Normal in size at 5.5 x 2.6 x 4.8 cm. Pulsed Doppler evaluation of both ovaries demonstrates normal low-resistance arterial and venous waveforms. Other findings No abnormal free fluid. IMPRESSION: 1. No evidence of ovarian torsion. 2. Mildly complex lesion within the RIGHT ovary likely represents a hemorrhagic cyst or corpus luteal cyst. Tubo-ovarian abscess less favored. 3. Normal uterus. 4. No clear explanation for abdominal pain or elevated white blood cell count on pelvic ultrasound. Electronically Signed   By: Genevive Bi M.D.   On: 09/12/2016 16:07   US Pelvis Complete  Result Date: 09/12/2016 CLINICAL DATA:  RIGHT lower quadrant pain since 08/24/2016. Elevated white blood cell count. EXAM: TRANSABDOMINAL AND TRANSVAGINAL ULTRASOUND OF PELVIS DOPPLER ULTRASOUND OF OVARIES TECHNIQUE: Both transabdominal and transvaginal ultrasound examinations of the pelvis were performed. Transabdominal technique was performed for global imaging of the pelvis including uterus, ovaries, adnexal regions, and pelvic  cul-de-sac. It was necessary to proceed with endovaginal exam following the transabdominal exam to visualize the ovaries. Color and duplex Doppler ultrasound was utilized to evaluate blood flow to the ovaries. COMPARISON:  CT 09/12/2016 FINDINGS: Uterus Measurements: Normal in size at 7.3 x 3.85 5.1 cm. No fibroids or other mass visualized. Endometrium Thickness: Normal in thickness at 6 mm. No focal abnormality visualized. Right ovary Measurements: Normal size at 4.654.4 x 3.8 cm. There is a complex hypoechoic 2.7 cm rounded lesion within the RIGHT ovary. Left ovary Measurements: Normal in size at 5.5 x 2.6 x 4.8 cm. Pulsed Doppler evaluation of both ovaries demonstrates normal low-resistance arterial and venous waveforms. Other findings No abnormal free fluid. IMPRESSION: 1. No evidence of ovarian torsion. 2. Mildly complex lesion within the RIGHT ovary likely represents a hemorrhagic cyst or corpus luteal cyst. Tubo-ovarian abscess less favored. 3. Normal uterus. 4. No clear explanation for abdominal pain or elevated white blood cell count on pelvic ultrasound. Electronically Signed   By: Genevive Bi M.D.   On: 09/12/2016 16:07   Ct Abdomen Pelvis W Contrast  Result Date: 09/12/2016 CLINICAL DATA:  Right lower quadrant abdominal and pelvic pain with leukocytosis. EXAM: CT ABDOMEN AND PELVIS WITH CONTRAST TECHNIQUE: Multidetector CT imaging of the abdomen and pelvis was performed using the standard protocol following bolus administration of intravenous contrast. CONTRAST:  ISOVUE-300 IOPAMIDOL (ISOVUE-300) INJECTION 61% COMPARISON:  None. FINDINGS: Lower chest: No acute abnormality. Hepatobiliary: No focal liver abnormality is seen. No gallstones, gallbladder wall thickening, or biliary dilatation. Pancreas: Unremarkable. No pancreatic ductal dilatation or surrounding inflammatory changes. Spleen: Normal in size without focal abnormality. Adrenals/Urinary Tract: Adrenal glands are unremarkable.  Kidneys are normal, without renal calculi, focal lesion, or hydronephrosis. Bladder is unremarkable. Stomach/Bowel: There is a significant amount of inflammation throughout the pelvis abutting distal ileum, the distal sigmoid colon and the rectum. The distal sigmoid colon and rectum are thickened. Focal secondary to ileus is suspected of the distal ileum. The appendix appears to extend medial to the cecum and does not appear overtly dilated or thickened. No evidence of bowel obstruction or free air. Vascular/Lymphatic: No significant vascular findings are present. No enlarged abdominal or pelvic lymph nodes. Reproductive: Irregular tubular complex fluid is seen to the right of the uterus in the adnexal region. Internal density is consistent with complex fluid and this may represent tubo-ovarian abscess. This finding as well as the overall epicenter of inflammation in the deep pelvis is suggestive of PID with tubo-ovarian abscess has etiology of inflammation. Additional possibility would be inflammatory bowel disease. Pelvic ultrasound may  help to delineate further the presence of tubo-ovarian abscess or dilated fallopian tube. Other: No hernias identified. Musculoskeletal: No acute or significant osseous findings. IMPRESSION: Intense inflammation in the pelvis causing secondary inflammation of multiple abutting bowel loops including the distal ileum, sigmoid colon and rectum. The epicenter of inflammation appears to be near a complex cystic structure in the right adnexal region and most likely etiology is favored to be PID with tubo-ovarian abscess. Inflammatory bowel disease would also be a consideration. This is not felt to represent acute appendicitis as the appendix is visible medial to the cecum and does not appear to be inflamed. Pelvic ultrasound may help to further delineate adnexal pathology. Electronically Signed   By: Irish Lack M.D.   On: 09/12/2016 14:23   [place] Art/ven Flow Abd Pelv  Doppler  Result Date: 09/12/2016 CLINICAL DATA:  RIGHT lower quadrant pain since 08/24/2016. Elevated white blood cell count. EXAM: TRANSABDOMINAL AND TRANSVAGINAL ULTRASOUND OF PELVIS DOPPLER ULTRASOUND OF OVARIES TECHNIQUE: Both transabdominal and transvaginal ultrasound examinations of the pelvis were performed. Transabdominal technique was performed for global imaging of the pelvis including uterus, ovaries, adnexal regions, and pelvic cul-de-sac. It was necessary to proceed with endovaginal exam following the transabdominal exam to visualize the ovaries. Color and duplex Doppler ultrasound was utilized to evaluate blood flow to the ovaries. COMPARISON:  CT 09/12/2016 FINDINGS: Uterus Measurements: Normal in size at 7.3 x 3.85 5.1 cm. No fibroids or other mass visualized. Endometrium Thickness: Normal in thickness at 6 mm. No focal abnormality visualized. Right ovary Measurements: Normal size at 4.654.4 x 3.8 cm. There is a complex hypoechoic 2.7 cm rounded lesion within the RIGHT ovary. Left ovary Measurements: Normal in size at 5.5 x 2.6 x 4.8 cm. Pulsed Doppler evaluation of both ovaries demonstrates normal low-resistance arterial and venous waveforms. Other findings No abnormal free fluid. IMPRESSION: 1. No evidence of ovarian torsion. 2. Mildly complex lesion within the RIGHT ovary likely represents a hemorrhagic cyst or corpus luteal cyst. Tubo-ovarian abscess less favored. 3. Normal uterus. 4. No clear explanation for abdominal pain or elevated white blood cell count on pelvic ultrasound. Electronically Signed   By: Genevive Bi M.D.   On: 09/12/2016 16:07    My personal review of EKG: None  Assessment & Plan:    Principal Problem:   SIRS (systemic inflammatory response syndrome) (HCC) Subcentimeter right lower quadrant abdominal pain Suspect acute infectious colitis versus PID versus versus inflammatory process.  Observe on MedSurg. IV normal saline at 125 mL/h, serial abdominal exam.  Place on when necessary IV Toradol and Percocet for pain. CT abdomen suggests PID vs TOA but pelvic doppler rules out possible tuboovarian abscess. -Check GC probe and HIV antibody. Check lactic acid and blood cultures. -We will place on empiric Zosyn. Add doxycycline to cover potential chlamydia. -If symptoms unimproved with current measures will need  GYN evaluation.    Active Problems:  Asthma Stable. Continue when necessary albuterol inhaler.  Tobacco abuse Smokes one pack per day. Counseled strongly on cessation. Order nicotine patch.   DVT Prophylaxissubcutaneous Lovenox  AM Labs Ordered, also please review Full Orders  Family Communication: Admission, patients condition and plan of care including tests being ordered have been discussed with the patient and mother at bedside .   Code Status: full code  Likely DC to  home  Condition fair  Consults called:  none  Admission status: observation  Time spent in minutes :60   Eddie North M.D on 09/12/2016 at 5:58 PM  Between 7am to 7pm - Pager - [phone]. After 7pm go to [email or web address] - password [email or web address]  [facility] - Office  [phone]

## 2016-09-12 NOTE — Progress Notes (Signed)
Pharmacy Antibiotic Note  Lindsey Castillo is a 26 y.o. female admitted on 09/12/2016 with concern for intra-abdominal infection.  Pharmacy has been consulted for zosyn dosing. Pt is afeb, WBC elevated to 18. SCr 0.84, CrCl ~95 ml/min. Pt rec'd one-time doses of azithromycin and ceftriaxone in ED.   Plan: Zosyn 3.375 mg IV x1 over 30 mins followed by 3.375 mg IV q8h extended infusion Monitor clinical picture, CBC, tmax F/u LOT, culture data   Height: 5\' 6"  (167.6 cm) Weight: 136 lb (61.7 kg) IBW/kg (Calculated) : 59.3  Temp (24hrs), Avg:98.9 F (37.2 C), Min:98.9 F (37.2 C), Max:98.9 F (37.2 C)   Recent Labs Lab 09/12/16 1251  WBC 18.0*  CREATININE 0.84    Estimated Creatinine Clearance: 95.8 mL/min (by C-G formula based on SCr of 0.84 mg/dL).    Allergies  Allergen Reactions  . Coconut Oil Itching and Swelling    Antimicrobials this admission: 1/1 zosyn >>  1/1 CTX x 1 in ED  1/1 Azithromycin x 1 in ED  Dose adjustments this admission: na  Microbiology results: 1/1 BCx: sent   Thank you for allowing pharmacy to be a part of this patient's care.  Allena KatzCaroline E Audra Bellard 09/12/2016 6:15 PM

## 2016-09-12 NOTE — ED Provider Notes (Signed)
Signed out by preceding ED team to check imaging studies when back.  Patient reports 3 weeks progressive rlq abd pain, nausea, to point today where symptom unbearable.   Mod-sev rlq tenderness. No frank peritonitis. Ct noted inflamm process esp right pelvis, ?inflammatory bowel, vs TOA, vs other inflammatory/inf process.   U/s does not show a toa.   Iv abx have been given.   Pain and nausea persist.  Will give additional pain med, ivf, and admit to medical service, obs status.  Dilaudid iv. zofran iv. Ns bolus.     Cathren LaineKevin Manjinder Breau, MD 09/12/16 [phone]

## 2016-09-12 NOTE — ED Triage Notes (Signed)
To ED for eval of abd pain which originally started 12/13. States that was 2 days past her cycle ending. States 2 days after this she started a light cycle again which lasted another week ending with 'one big clot'. No bleeding since but pain has progressed and now in RLQ. Pt tearful. No vomiting. Appears very uncomfortable.

## 2016-09-12 NOTE — ED Notes (Signed)
ED Provider at bedside. 

## 2016-09-12 NOTE — ED Provider Notes (Addendum)
[facility]-EMERGENCY DEPT Provider Note   CSN: [id number] Arrival date & time: 09/12/16  1146     History   Chief Complaint Chief Complaint  Patient presents with  . Abdominal Pain    HPI Lindsey Castillo is a 26 y.o. female.  HPI Lindsey Castillo is a 26 y.o. female with PMH significant for irregular menses who presents with gradual onset, constant, severe RLQ pain x 1 week.  Patient states she began experiencing upper abdominal pain about 2-3 weeks ago that has progressively localized to her RLQ over the last week.  She describes it as cramping, constant, and non-radiating.  She has been taking ibuprofen and tylenol without relief.  Worse with movement and walking.  Last BM 2 days ago and normal.  No prior abdominal surgeries.  No fever, chills, N/V/D/C, black or bloody stools, urinary symptoms, vaginal bleeding.  She does report brown vaginal discharge. No new sexual partners.  Hx of trichomonas.     Blood pressure 118/81, pulse 108, temperature 98.9 F (37.2 C), temperature source Oral, resp. rate 21, height 5\' 6"  (1.676 m), weight 61.7 kg, last menstrual period 08/26/2016, SpO2 100 %.   Past Medical History:  Diagnosis Date  . Abnormal Pap smear    last pap 07/2012  . Asthma    USES INHALER  . Eczema   . Fracture, ankle AGE 71  . Headache(784.0)   . Infection    trich  . Infection    BV  . Infection    HX OF FREQUENT  . NSVD (normal spontaneous vaginal delivery) 01/01/2013   SVD at 1525  . Seasonal allergies     Patient Active Problem List   Diagnosis Date Noted  . NSVD (normal spontaneous vaginal delivery) 01/01/2013  . Asthma 08/02/2012  . Eczema 08/02/2012    Past Surgical History:  Procedure Laterality Date  . FOOT SURGERY      OB History    Gravida Para Term Preterm AB Living   1 1 1     1    SAB TAB Ectopic Multiple Live Births           1       Home Medications    Prior to Admission medications   Medication Sig Start Date End Date Taking? Authorizing  Provider  albuterol (PROVENTIL) (5 MG/ML) 0.5% nebulizer solution Take 0.5 mLs (2.5 mg total) by nebulization every 6 (six) hours as needed. 10/18/12  Yes Silverio Lay, MD  ibuprofen (ADVIL,MOTRIN) 600 MG tablet Take 1 tablet (600 mg total) by mouth every 6 (six) hours as needed. 08/16/16  Yes Barrett Henle, PA-C  ferrous sulfate (FERROUSUL) 325 (65 FE) MG tablet Take 1 tablet (325 mg total) by mouth 2 (two) times daily. Patient not taking: Reported on 09/12/2016 05/27/15   Wilmer Floor Leftwich-Kirby, CNM  norgestimate-ethinyl estradiol (ORTHO-CYCLEN,SPRINTEC,PREVIFEM) 0.25-35 MG-MCG tablet Take 3 tablets/day for 3 days, then 2 tablets/day for 3 days, then 1 tablet daily until all the tablets with medicine are completed.  Do not take the placebo pills at the end of the pack, instead start a new pack of pills, taking 1 pill daily. Patient not taking: Reported on 09/12/2016 11/30/15   Marny Lowenstein, PA-C  oxymetazoline (AFRIN NASAL SPRAY) 0.05 % nasal spray Place 1 spray into both nostrils 2 (two) times daily. Spray once into each nostril twice daily for up to the next 3 days. Do not use for more than 3 days to prevent rebound rhinorrhea. Patient not taking:  Reported on 09/12/2016 08/16/16   Barrett HenleNicole Elizabeth Nadeau, PA-C  promethazine (PHENERGAN) 25 MG tablet Take 0.5-1 tablets (12.5-25 mg total) by mouth every 6 (six) hours as needed for nausea. Patient not taking: Reported on 11/30/2015 05/27/15   Hurshel PartyLisa A Leftwich-Kirby, CNM    Family History Family History  Problem Relation Age of Onset  . Hypertension Father   . Cancer Maternal Grandmother   . Diabetes Maternal Grandmother   . Depression Mother   . Birth defects Sister     HEART MURMUR  . Asthma Brother   . Miscarriages / [place] Cousin   . Thyroid disease Sister     Social History Social History  Substance Use Topics  . Smoking status: Current Every Day Smoker    Types: Cigarettes    Last attempt to quit: 05/24/2012  . Smokeless  tobacco: Never Used  . Alcohol use 0.0 oz/week     Allergies   Coconut oil   Review of Systems Review of Systems All other systems negative unless otherwise stated in HPI   Physical Exam Updated Vital Signs BP 113/87   Pulse 80   Temp 98.9 F (37.2 C) (Oral)   Resp 18   Ht 5\' 6"  (1.676 m)   Wt 61.7 kg   LMP 08/26/2016 (LMP Unknown) Comment: see triage note, neg upreg  SpO2 100%   BMI 21.95 kg/m   Physical Exam  Constitutional: She is oriented to person, place, and time. She appears well-developed and well-nourished.  Non-toxic appearance. She does not have a sickly appearance. She does not appear ill. She appears distressed.  Appears uncomfortable, tearful.   HENT:  Head: Normocephalic and atraumatic.  Mouth/Throat: Oropharynx is clear and moist.  Eyes: Conjunctivae are normal. Pupils are equal, round, and reactive to light.  Neck: Normal range of motion. Neck supple.  Cardiovascular: Normal rate and regular rhythm.   Pulmonary/Chest: Effort normal and breath sounds normal. No accessory muscle usage or stridor. No respiratory distress. She has no wheezes. She has no rhonchi. She has no rales.  Abdominal: Soft. Bowel sounds are normal. She exhibits no distension. There is tenderness in the right lower quadrant. There is rebound, guarding and tenderness at McBurney's point. There is no rigidity.  Genitourinary: Uterus normal. Cervix exhibits motion tenderness and discharge. Cervix exhibits no friability. Right adnexum displays tenderness. Left adnexum displays no tenderness.  Genitourinary Comments: Chaperone present during exam.   Musculoskeletal: Normal range of motion.  Lymphadenopathy:    She has no cervical adenopathy.  Neurological: She is alert and oriented to person, place, and time.  Speech clear without dysarthria.  Skin: Skin is warm and dry.  Psychiatric: She has a normal mood and affect. Her behavior is normal.     ED Treatments / Results  Labs (all  labs ordered are listed, but only abnormal results are displayed) Labs Reviewed  COMPREHENSIVE METABOLIC PANEL - Abnormal; Notable for the following:       Result Value   BUN <5 (*)    AST 14 (*)    ALT 9 (*)    All other components within normal limits  CBC WITH DIFFERENTIAL/PLATELET - Abnormal; Notable for the following:    WBC 18.0 (*)    HCT 35.9 (*)    RDW 17.9 (*)    Neutro Abs 15.0 (*)    All other components within normal limits  URINALYSIS, ROUTINE W REFLEX MICROSCOPIC - Abnormal; Notable for the following:    Hgb urine dipstick SMALL (*)  Ketones, ur 5 (*)    Bacteria, UA RARE (*)    Squamous Epithelial / LPF 0-5 (*)    All other components within normal limits  WET PREP, GENITAL  LIPASE, BLOOD  RPR  HIV ANTIBODY (ROUTINE TESTING)  I-STAT BETA HCG BLOOD, ED ([facility], [facility], [facility] ONLY)  GC/CHLAMYDIA PROBE AMP ([place]) NOT AT [facility]    EKG  EKG Interpretation None       Radiology Ct Abdomen Pelvis W Contrast  Result Date: 09/12/2016 CLINICAL DATA:  Right lower quadrant abdominal and pelvic pain with leukocytosis. EXAM: CT ABDOMEN AND PELVIS WITH CONTRAST TECHNIQUE: Multidetector CT imaging of the abdomen and pelvis was performed using the standard protocol following bolus administration of intravenous contrast. CONTRAST:  ISOVUE-300 IOPAMIDOL (ISOVUE-300) INJECTION 61% COMPARISON:  None. FINDINGS: Lower chest: No acute abnormality. Hepatobiliary: No focal liver abnormality is seen. No gallstones, gallbladder wall thickening, or biliary dilatation. Pancreas: Unremarkable. No pancreatic ductal dilatation or surrounding inflammatory changes. Spleen: Normal in size without focal abnormality. Adrenals/Urinary Tract: Adrenal glands are unremarkable. Kidneys are normal, without renal calculi, focal lesion, or hydronephrosis. Bladder is unremarkable. Stomach/Bowel: There is a significant amount of inflammation throughout the pelvis abutting distal ileum, the distal sigmoid colon  and the rectum. The distal sigmoid colon and rectum are thickened. Focal secondary to ileus is suspected of the distal ileum. The appendix appears to extend medial to the cecum and does not appear overtly dilated or thickened. No evidence of bowel obstruction or free air. Vascular/Lymphatic: No significant vascular findings are present. No enlarged abdominal or pelvic lymph nodes. Reproductive: Irregular tubular complex fluid is seen to the right of the uterus in the adnexal region. Internal density is consistent with complex fluid and this may represent tubo-ovarian abscess. This finding as well as the overall epicenter of inflammation in the deep pelvis is suggestive of PID with tubo-ovarian abscess has etiology of inflammation. Additional possibility would be inflammatory bowel disease. Pelvic ultrasound may help to delineate further the presence of tubo-ovarian abscess or dilated fallopian tube. Other: No hernias identified. Musculoskeletal: No acute or significant osseous findings. IMPRESSION: Intense inflammation in the pelvis causing secondary inflammation of multiple abutting bowel loops including the distal ileum, sigmoid colon and rectum. The epicenter of inflammation appears to be near a complex cystic structure in the right adnexal region and most likely etiology is favored to be PID with tubo-ovarian abscess. Inflammatory bowel disease would also be a consideration. This is not felt to represent acute appendicitis as the appendix is visible medial to the cecum and does not appear to be inflamed. Pelvic ultrasound may help to further delineate adnexal pathology. Electronically Signed   By: Irish Lack M.D.   On: 09/12/2016 14:23    Procedures Procedures (including critical care time)  Medications Ordered in ED Medications  morphine 4 MG/ML injection 4 mg (4 mg Intravenous Given 09/12/16 1254)  sodium chloride 0.9 % bolus 1,000 mL (0 mLs Intravenous Stopped 09/12/16 1451)  iopamidol (ISOVUE-300)  61 % injection (100 mLs  Contrast Given 09/12/16 1307)  morphine 4 MG/ML injection 4 mg (4 mg Intravenous Given 09/12/16 1417)  azithromycin (ZITHROMAX) tablet 1,000 mg (1,000 mg Oral Given 09/12/16 1444)  cefTRIAXone (ROCEPHIN) injection 250 mg (250 mg Intramuscular Given 09/12/16 1447)  lidocaine (PF) (XYLOCAINE) 1 % injection (5 mLs  Given 09/12/16 1447)     Initial Impression / Assessment and Plan / ED Course  I have reviewed the triage vital signs and the nursing notes.  Pertinent labs & imaging results that were available during my care of the patient were reviewed by me and considered in my medical decision making (see chart for details).  Clinical Course    Patient presents with RLQ abdominal pain, she appears uncomfortable.  She is tachycardic, normotensive, and afebrile.  Tachycardia likely secondary to pain.  She appears non-toxic or septic.  On exam, she has localized tenderness in RLQ with some guarding and rebound.  Concern for appendicitis.  Will obtain labs, CT, give fluids, and pain control.  If CT negative, will need pelvic ultrasound to assess for ovarian torsion.  She is not pregnant.   Labs with leukocytosis; otherwise, unremarkable.  CT shows pelvic inflammation causing secondary inflammation of colon.  Appears to be complex cystic structure in right adnexa concerning for PID with TOA.  Appendix is normal.  Pelvic exam reveals moderate white cervical discharge with CMT and right adnexal tenderness with moderate cervical discharge.  GC/Chlamydia, HIV, and RPR.  Treated with Azithromycin and Rocephin.  Wet prep pending.  Pelvic ultrasound pending.  Will likely need OB/GYN consult + discharge home with doxycycline and flagyl. Hand off to oncoming MD who will follow up on imaging and dispo.   Final Clinical Impressions(s) / ED Diagnoses   Final diagnoses:  RLQ abdominal pain    New Prescriptions New Prescriptions   No medications on file     Cheri Fowler, PA-C 09/12/16 1554      Rolan Bucco, MD 09/12/16 1554    Cheri Fowler, PA-C 09/12/16 1601    Rolan Bucco, MD 09/12/16 1605

## 2016-09-12 NOTE — ED Notes (Signed)
Pt returned to room and placed back on monitor.  

## 2016-09-12 NOTE — ED Notes (Signed)
Patient transported to CT 

## 2016-09-12 NOTE — ED Notes (Signed)
notified lab of need to collect blood.

## 2016-09-13 LAB — CBC
HEMATOCRIT: 27.9 % — AB (ref 36.0–46.0)
Hemoglobin: 9.2 g/dL — ABNORMAL LOW (ref 12.0–15.0)
MCH: 26.7 pg (ref 26.0–34.0)
MCHC: 33 g/dL (ref 30.0–36.0)
MCV: 80.9 fL (ref 78.0–100.0)
Platelets: 292 10*3/uL (ref 150–400)
RBC: 3.45 MIL/uL — ABNORMAL LOW (ref 3.87–5.11)
RDW: 18 % — AB (ref 11.5–15.5)
WBC: 14 10*3/uL — ABNORMAL HIGH (ref 4.0–10.5)

## 2016-09-13 LAB — BASIC METABOLIC PANEL
Anion gap: 5 (ref 5–15)
CO2: 24 mmol/L (ref 22–32)
Calcium: 7.7 mg/dL — ABNORMAL LOW (ref 8.9–10.3)
Chloride: 107 mmol/L (ref 101–111)
Creatinine, Ser: 0.68 mg/dL (ref 0.44–1.00)
GFR calc Af Amer: >60 mL/min (ref >60)
GLUCOSE: 77 mg/dL (ref 65–99)
POTASSIUM: 3.7 mmol/L (ref 3.5–5.1)
Sodium: 136 mmol/L (ref 135–145)

## 2016-09-13 LAB — GC/CHLAMYDIA PROBE AMP (~~LOC~~) NOT AT ARMC
Chlamydia: NEGATIVE
Neisseria Gonorrhea: POSITIVE — AB

## 2016-09-13 LAB — RPR: RPR Ser Ql: NONREACTIVE

## 2016-09-13 LAB — HIV ANTIBODY (ROUTINE TESTING W REFLEX): HIV SCREEN 4TH GENERATION: NONREACTIVE

## 2016-09-13 MED ORDER — SENNOSIDES-DOCUSATE SODIUM 8.6-50 MG PO TABS
1.0000 | ORAL_TABLET | Freq: Every day | ORAL | Status: DC
  Administered 2016-09-13: 1 via ORAL
  Filled 2016-09-13 (×2): qty 1

## 2016-09-13 MED ORDER — DIPHENHYDRAMINE HCL 25 MG PO CAPS
25.0000 mg | ORAL_CAPSULE | Freq: Three times a day (TID) | ORAL | Status: DC | PRN
  Administered 2016-09-13: 25 mg via ORAL
  Filled 2016-09-13 (×2): qty 1

## 2016-09-13 NOTE — Progress Notes (Addendum)
PROGRESS NOTE  Lindsey Castillo ZOX:[id number] DOB: 1991/07/12 DOA: 09/12/2016 PCP: Dorrene German, MD   LOS: 0 days   Brief Narrative: 26 y.o. female, with past medical hx of asthma, hx of trichomonas at age of 42, active smoker Who presented to the ED with persistent right lower quadrant abdominal pain for the past 1 week. She had pain involving the epigastric and umbilical area starting 3 weeks back. About 2 days after the onset of symptoms he has her menstrual period. The pain was consistent and cramping and then until about a week back it relocated to the right lower quadrant and was localized to the area. The pain was 10/10 in severity, crampy in nature, without radiation, aggravated on minimal exertion including urination, attempting to have bowel movement or passing flatus  Assessment & Plan: Principal Problem:   SIRS (systemic inflammatory response syndrome) (HCC) Active Problems:   Right lower quadrant abdominal pain   SIRS (systemic inflammatory response syndrome) (HCC) right lower quadrant abdominal pain - Initial CT scan of abdomen and pelvis showed inflammation in the pelvis causing secondary inflammation of multiple bowel loops including distal ileum, sigmoid colon and rectum. There was concern for tubo-ovarian abscess, and patient underwent transvaginal ultrasound which was negative for ovarian torsion, he did show a mildly complex lesion in the right ovary which is likely a hemorrhagic cyst of corpus luteal cyst and less likely a tubo-ovarian abscess. D/w Dr. Adrian Blackwater who reviewed images, and it appears that the ovarian findings alone could not explain the level of bowel inflammation seen on CT - She was placed on IV Zosyn and doxycycline, will continue for now, continue IV fluids, her pain seems to be a little bit better this morning. HIV antibody and RPR are negative. GC probe still in process.  Asthma  - Stable. Continue when necessary albuterol inhaler.  Tobacco abuse -  Smokes one pack per day. Counseled strongly on cessation. Order nicotine patch.   DVT prophylaxis: Lovenox Code Status: Full Family Communication: d/w boyfriend bedside Disposition Plan: home when ready  Consultants:   None   Procedures:   None   Antimicrobials:  Zosyn 1/1/ >>   Subjective: - Abdominal pain is a little bit better, however states that when the pain medications wear off it's coming back. Denies any nausea or vomiting. Has had no bowel movements yet  Objective: Vitals:   09/12/16 1745 09/12/16 1827 09/12/16 2050 09/13/16 0459  BP: 134/91 116/84 106/60 110/77  Pulse: 103 86 92 91  Resp:  18 18 17   Temp:  98.7 F (37.1 C) 98.9 F (37.2 C) 97.7 F (36.5 C)  TempSrc:  Oral Oral Oral  SpO2: 100% 100% 99% 100%  Weight:      Height:        Intake/Output Summary (Last 24 hours) at 09/13/16 1017 Last data filed at 09/13/16 0944  Gross per 24 hour  Intake          3681.17 ml  Output                0 ml  Net          3681.17 ml   Filed Weights   09/12/16 1155  Weight: 61.7 kg (136 lb)    Examination: Constitutional: NAD Vitals:   09/12/16 1745 09/12/16 1827 09/12/16 2050 09/13/16 0459  BP: 134/91 116/84 106/60 110/77  Pulse: 103 86 92 91  Resp:  18 18 17   Temp:  98.7 F (37.1 C) 98.9 F (37.2  C) 97.7 F (36.5 C)  TempSrc:  Oral Oral Oral  SpO2: 100% 100% 99% 100%  Weight:      Height:       Eyes: PERRL, lids and conjunctivae normal Neck: normal, supple, no masses Respiratory: clear to auscultation bilaterally, no wheezing, no crackles.  Cardiovascular: Regular rate and rhythm, no murmurs / rubs / gallops. Abdomen: mild RLQ tenderness, no guarding / rebound. Bowel sounds positive.  Neurologic: non focal.    Data Reviewed: I have personally reviewed following labs and imaging studies  CBC:  Recent Labs Lab 09/12/16 1251 09/13/16 0347  WBC 18.0* 14.0*  NEUTROABS 15.0*  --   HGB 12.1 9.2*  HCT 35.9* 27.9*  MCV 80.9 80.9  PLT 392  292   Basic Metabolic Panel:  Recent Labs Lab 09/12/16 1251 09/13/16 0347  NA 139 136  K 3.7 3.7  CL 104 107  CO2 25 24  GLUCOSE 89 77  BUN <5* <5*  CREATININE 0.84 0.68  CALCIUM 9.1 7.7*   GFR: Estimated Creatinine Clearance: 100.6 mL/min (by C-G formula based on SCr of 0.68 mg/dL). Liver Function Tests:  Recent Labs Lab 09/12/16 1251  AST 14*  ALT 9*  ALKPHOS 61  BILITOT 0.4  PROT 8.0  ALBUMIN 3.8    Recent Labs Lab 09/12/16 1251  LIPASE 17   No results for input(s): AMMONIA in the last 168 hours. Coagulation Profile: No results for input(s): INR, PROTIME in the last 168 hours. Cardiac Enzymes: No results for input(s): CKTOTAL, CKMB, CKMBINDEX, TROPONINI in the last 168 hours. BNP (last 3 results) No results for input(s): PROBNP in the last 8760 hours. HbA1C: No results for input(s): HGBA1C in the last 72 hours. CBG: No results for input(s): GLUCAP in the last 168 hours. Lipid Profile: No results for input(s): CHOL, HDL, LDLCALC, TRIG, CHOLHDL, LDLDIRECT in the last 72 hours. Thyroid Function Tests: No results for input(s): TSH, T4TOTAL, FREET4, T3FREE, THYROIDAB in the last 72 hours. Anemia Panel: No results for input(s): VITAMINB12, FOLATE, FERRITIN, TIBC, IRON, RETICCTPCT in the last 72 hours. Urine analysis:    Component Value Date/Time   COLORURINE YELLOW 09/12/2016 1243   APPEARANCEUR CLEAR 09/12/2016 1243   LABSPEC 1.013 09/12/2016 1243   PHURINE 7.0 09/12/2016 1243   GLUCOSEU NEGATIVE 09/12/2016 1243   HGBUR SMALL (A) 09/12/2016 1243   BILIRUBINUR NEGATIVE 09/12/2016 1243   KETONESUR 5 (A) 09/12/2016 1243   PROTEINUR NEGATIVE 09/12/2016 1243   UROBILINOGEN 0.2 05/26/2015 2100   NITRITE NEGATIVE 09/12/2016 1243   LEUKOCYTESUR NEGATIVE 09/12/2016 1243   Sepsis Labs: Invalid input(s): PROCALCITONIN, LACTICIDVEN  Recent Results (from the past 240 hour(s))  Wet prep, genital     Status: Abnormal   Collection Time: 09/12/16  2:35 PM    Result Value Ref Range Status   Yeast Wet Prep HPF POC NONE SEEN NONE SEEN Final   Trich, Wet Prep NONE SEEN NONE SEEN Final   Clue Cells Wet Prep HPF POC NONE SEEN NONE SEEN Final   WBC, Wet Prep HPF POC MANY (A) NONE SEEN Final   Sperm NONE SEEN  Final      Radiology Studies: US Transvaginal Non-ob  Result Date: 09/12/2016 CLINICAL DATA:  RIGHT lower quadrant pain since 08/24/2016. Elevated white blood cell count. EXAM: TRANSABDOMINAL AND TRANSVAGINAL ULTRASOUND OF PELVIS DOPPLER ULTRASOUND OF OVARIES TECHNIQUE: Both transabdominal and transvaginal ultrasound examinations of the pelvis were performed. Transabdominal technique was performed for global imaging of the pelvis including uterus, ovaries, adnexal regions,  and pelvic cul-de-sac. It was necessary to proceed with endovaginal exam following the transabdominal exam to visualize the ovaries. Color and duplex Doppler ultrasound was utilized to evaluate blood flow to the ovaries. COMPARISON:  CT 09/12/2016 FINDINGS: Uterus Measurements: Normal in size at 7.3 x 3.85 5.1 cm. No fibroids or other mass visualized. Endometrium Thickness: Normal in thickness at 6 mm. No focal abnormality visualized. Right ovary Measurements: Normal size at 4.654.4 x 3.8 cm. There is a complex hypoechoic 2.7 cm rounded lesion within the RIGHT ovary. Left ovary Measurements: Normal in size at 5.5 x 2.6 x 4.8 cm. Pulsed Doppler evaluation of both ovaries demonstrates normal low-resistance arterial and venous waveforms. Other findings No abnormal free fluid. IMPRESSION: 1. No evidence of ovarian torsion. 2. Mildly complex lesion within the RIGHT ovary likely represents a hemorrhagic cyst or corpus luteal cyst. Tubo-ovarian abscess less favored. 3. Normal uterus. 4. No clear explanation for abdominal pain or elevated white blood cell count on pelvic ultrasound. Electronically Signed   By: Genevive BiStewart  Edmunds M.D.   On: 09/12/2016 16:07   [place] Pelvis Complete  Result Date:  09/12/2016 CLINICAL DATA:  RIGHT lower quadrant pain since 08/24/2016. Elevated white blood cell count. EXAM: TRANSABDOMINAL AND TRANSVAGINAL ULTRASOUND OF PELVIS DOPPLER ULTRASOUND OF OVARIES TECHNIQUE: Both transabdominal and transvaginal ultrasound examinations of the pelvis were performed. Transabdominal technique was performed for global imaging of the pelvis including uterus, ovaries, adnexal regions, and pelvic cul-de-sac. It was necessary to proceed with endovaginal exam following the transabdominal exam to visualize the ovaries. Color and duplex Doppler ultrasound was utilized to evaluate blood flow to the ovaries. COMPARISON:  CT 09/12/2016 FINDINGS: Uterus Measurements: Normal in size at 7.3 x 3.85 5.1 cm. No fibroids or other mass visualized. Endometrium Thickness: Normal in thickness at 6 mm. No focal abnormality visualized. Right ovary Measurements: Normal size at 4.654.4 x 3.8 cm. There is a complex hypoechoic 2.7 cm rounded lesion within the RIGHT ovary. Left ovary Measurements: Normal in size at 5.5 x 2.6 x 4.8 cm. Pulsed Doppler evaluation of both ovaries demonstrates normal low-resistance arterial and venous waveforms. Other findings No abnormal free fluid. IMPRESSION: 1. No evidence of ovarian torsion. 2. Mildly complex lesion within the RIGHT ovary likely represents a hemorrhagic cyst or corpus luteal cyst. Tubo-ovarian abscess less favored. 3. Normal uterus. 4. No clear explanation for abdominal pain or elevated white blood cell count on pelvic ultrasound. Electronically Signed   By: Genevive BiStewart  Edmunds M.D.   On: 09/12/2016 16:07   Ct Abdomen Pelvis W Contrast  Result Date: 09/12/2016 CLINICAL DATA:  Right lower quadrant abdominal and pelvic pain with leukocytosis. EXAM: CT ABDOMEN AND PELVIS WITH CONTRAST TECHNIQUE: Multidetector CT imaging of the abdomen and pelvis was performed using the standard protocol following bolus administration of intravenous contrast. CONTRAST:  100mL ISOVUE-300  IOPAMIDOL (ISOVUE-300) INJECTION 61% COMPARISON:  None. FINDINGS: Lower chest: No acute abnormality. Hepatobiliary: No focal liver abnormality is seen. No gallstones, gallbladder wall thickening, or biliary dilatation. Pancreas: Unremarkable. No pancreatic ductal dilatation or surrounding inflammatory changes. Spleen: Normal in size without focal abnormality. Adrenals/Urinary Tract: Adrenal glands are unremarkable. Kidneys are normal, without renal calculi, focal lesion, or hydronephrosis. Bladder is unremarkable. Stomach/Bowel: There is a significant amount of inflammation throughout the pelvis abutting distal ileum, the distal sigmoid colon and the rectum. The distal sigmoid colon and rectum are thickened. Focal secondary to ileus is suspected of the distal ileum. The appendix appears to extend medial to the cecum and does not appear  overtly dilated or thickened. No evidence of bowel obstruction or free air. Vascular/Lymphatic: No significant vascular findings are present. No enlarged abdominal or pelvic lymph nodes. Reproductive: Irregular tubular complex fluid is seen to the right of the uterus in the adnexal region. Internal density is consistent with complex fluid and this may represent tubo-ovarian abscess. This finding as well as the overall epicenter of inflammation in the deep pelvis is suggestive of PID with tubo-ovarian abscess has etiology of inflammation. Additional possibility would be inflammatory bowel disease. Pelvic ultrasound may help to delineate further the presence of tubo-ovarian abscess or dilated fallopian tube. Other: No hernias identified. Musculoskeletal: No acute or significant osseous findings. IMPRESSION: Intense inflammation in the pelvis causing secondary inflammation of multiple abutting bowel loops including the distal ileum, sigmoid colon and rectum. The epicenter of inflammation appears to be near a complex cystic structure in the right adnexal region and most likely etiology is  favored to be PID with tubo-ovarian abscess. Inflammatory bowel disease would also be a consideration. This is not felt to represent acute appendicitis as the appendix is visible medial to the cecum and does not appear to be inflamed. Pelvic ultrasound may help to further delineate adnexal pathology. Electronically Signed   By: Irish Lack M.D.   On: 09/12/2016 14:23   [place] Art/ven Flow Abd Pelv Doppler  Result Date: 09/12/2016 CLINICAL DATA:  RIGHT lower quadrant pain since 08/24/2016. Elevated white blood cell count. EXAM: TRANSABDOMINAL AND TRANSVAGINAL ULTRASOUND OF PELVIS DOPPLER ULTRASOUND OF OVARIES TECHNIQUE: Both transabdominal and transvaginal ultrasound examinations of the pelvis were performed. Transabdominal technique was performed for global imaging of the pelvis including uterus, ovaries, adnexal regions, and pelvic cul-de-sac. It was necessary to proceed with endovaginal exam following the transabdominal exam to visualize the ovaries. Color and duplex Doppler ultrasound was utilized to evaluate blood flow to the ovaries. COMPARISON:  CT 09/12/2016 FINDINGS: Uterus Measurements: Normal in size at 7.3 x 3.85 5.1 cm. No fibroids or other mass visualized. Endometrium Thickness: Normal in thickness at 6 mm. No focal abnormality visualized. Right ovary Measurements: Normal size at 4.654.4 x 3.8 cm. There is a complex hypoechoic 2.7 cm rounded lesion within the RIGHT ovary. Left ovary Measurements: Normal in size at 5.5 x 2.6 x 4.8 cm. Pulsed Doppler evaluation of both ovaries demonstrates normal low-resistance arterial and venous waveforms. Other findings No abnormal free fluid. IMPRESSION: 1. No evidence of ovarian torsion. 2. Mildly complex lesion within the RIGHT ovary likely represents a hemorrhagic cyst or corpus luteal cyst. Tubo-ovarian abscess less favored. 3. Normal uterus. 4. No clear explanation for abdominal pain or elevated white blood cell count on pelvic ultrasound. Electronically  Signed   By: Genevive Bi M.D.   On: 09/12/2016 16:07     Scheduled Meds: . doxycycline  100 mg Oral Q12H  . enoxaparin (LOVENOX) injection  40 mg Subcutaneous Q24H  . nicotine  21 mg Transdermal Daily  . piperacillin-tazobactam (ZOSYN)  IV  3.375 g Intravenous Q8H  . polyethylene glycol  17 g Oral Daily   Continuous Infusions: . sodium chloride 125 mL/hr at 09/13/16 0512    Pamella Pert, MD, PhD [facility] Pager [phone]  If 7PM-7AM, please contact night-coverage [email or web address] Password [email or web address] 09/13/2016, 10:17 AM

## 2016-09-14 DIAGNOSIS — K529 Noninfective gastroenteritis and colitis, unspecified: Secondary | ICD-10-CM | POA: Diagnosis present

## 2016-09-14 DIAGNOSIS — A549 Gonococcal infection, unspecified: Secondary | ICD-10-CM

## 2016-09-14 LAB — BASIC METABOLIC PANEL
Anion gap: 6 (ref 5–15)
CO2: 24 mmol/L (ref 22–32)
Calcium: 8 mg/dL — ABNORMAL LOW (ref 8.9–10.3)
Chloride: 108 mmol/L (ref 101–111)
Creatinine, Ser: 0.75 mg/dL (ref 0.44–1.00)
GFR calc Af Amer: >60 mL/min (ref >60)
GLUCOSE: 86 mg/dL (ref 65–99)
POTASSIUM: 3.9 mmol/L (ref 3.5–5.1)
Sodium: 138 mmol/L (ref 135–145)

## 2016-09-14 LAB — CBC
HEMATOCRIT: 28.1 % — AB (ref 36.0–46.0)
Hemoglobin: 9.1 g/dL — ABNORMAL LOW (ref 12.0–15.0)
MCH: 26.4 pg (ref 26.0–34.0)
MCHC: 32.4 g/dL (ref 30.0–36.0)
MCV: 81.4 fL (ref 78.0–100.0)
Platelets: 316 10*3/uL (ref 150–400)
RBC: 3.45 MIL/uL — ABNORMAL LOW (ref 3.87–5.11)
RDW: 18.1 % — AB (ref 11.5–15.5)
WBC: 9.1 10*3/uL (ref 4.0–10.5)

## 2016-09-14 MED ORDER — KETOROLAC TROMETHAMINE 15 MG/ML IJ SOLN
15.0000 mg | Freq: Four times a day (QID) | INTRAMUSCULAR | Status: DC
  Administered 2016-09-14 – 2016-09-15 (×4): 15 mg via INTRAVENOUS
  Filled 2016-09-14 (×4): qty 1

## 2016-09-14 MED ORDER — FLUCONAZOLE 100 MG PO TABS
150.0000 mg | ORAL_TABLET | Freq: Once | ORAL | Status: AC
  Administered 2016-09-14: 150 mg via ORAL
  Filled 2016-09-14: qty 2

## 2016-09-14 MED ORDER — AMOXICILLIN-POT CLAVULANATE 875-125 MG PO TABS
1.0000 | ORAL_TABLET | Freq: Two times a day (BID) | ORAL | Status: DC
  Administered 2016-09-14 – 2016-09-15 (×3): 1 via ORAL
  Filled 2016-09-14 (×3): qty 1

## 2016-09-14 NOTE — Progress Notes (Signed)
[facility] Progress Note  Patient: Lindsey Castillo NWG:[id number]   PCP: Dorrene German, MD DOB: 1990-10-28   DOA: 09/12/2016   DOS: 09/14/2016   Date of Service: the patient was seen and examined on 09/14/2016  Brief hospital course: 26 y.o.female,with past medical hx of asthma, hx of trichomonas at age of 38, active smoker Who presented to the ED with persistent right lower quadrant abdominal pain for the past 1 week. She had pain involving the epigastric and umbilical area starting 3 weeks back. About 2 days after the onset of symptoms he has her menstrual period. The pain was consistent and cramping and then until about a week back itrelocated to the right lower quadrant and was localized to the area. The pain was 10/10 in severity, crampy in nature, without radiation, aggravated on minimal exertion including urination, attempting to have bowel movement or passing flatus  Currently further plan is monitor for pain improvement and diet tolerance.  Assessment and Plan: 1. Gonorrhea in female Gonorrhea probe positive. Started on doxycycline since admission. Discuss with pharmacy will finish 2 weeks of course. Clinically getting better, no burning UTI and no discharge at present. No evidence of PID on ultrasound. Right ovarian cyst appears to be more likely LUTEAL cyst. Earlier provider D/w Dr. Adrian Blackwater who reviewed images, and it appears that the ovarian findings alone could not explain the level of bowel inflammation seen on CT  2. Colitis. SIRS. Started on IV Zosyn since admission. CT scan shows evidence of colitis. Patient continues to have severe pain when passing flatness or when having a suggestion of a BM. We will use stool softener. Continue diet. Change antibiotic to Augmentin.  3. Asthma  - Stable. Continue when necessary albuterol inhaler.  4. Tobacco abuse - Smokes one pack per day. Counseled strongly on cessation. Order nicotine patch.  Bowel regimen: last BM  prior to admission Diet: regular diet DVT Prophylaxis: subcutaneous Heparin  Advance goals of care discussion: full code3  Family Communication: no family was present at bedside, at the time of interview.    Disposition:  Discharge to home. Expected discharge date: 01/04-01/2017, improvement in pain tolerance as well as diet tolerance with bowel movements.  Consultants: no Procedures: no  Antibiotics: Anti-infectives    Start     Dose/Rate Route Frequency Ordered Stop   09/14/16 1245  amoxicillin-clavulanate (AUGMENTIN) 875-125 MG per tablet 1 tablet     1 tablet Oral Every 12 hours 09/14/16 1231     09/13/16 0600  piperacillin-tazobactam (ZOSYN) IVPB 3.375 g  Status:  Discontinued     3.375 g 12.5 mL/hr over 240 Minutes Intravenous Every 8 hours 09/12/16 1820 09/14/16 1230   09/13/16 0215  piperacillin-tazobactam (ZOSYN) IVPB 3.375 g  Status:  Discontinued     3.375 g 12.5 mL/hr over 240 Minutes Intravenous Every 8 hours 09/12/16 1813 09/12/16 1819   09/12/16 2200  piperacillin-tazobactam (ZOSYN) IVPB 3.375 g  Status:  Discontinued     3.375 g 12.5 mL/hr over 240 Minutes Intravenous Every 8 hours 09/12/16 1813 09/12/16 1817   09/12/16 2200  piperacillin-tazobactam (ZOSYN) IVPB 3.375 g     3.375 g 100 mL/hr over 30 Minutes Intravenous  Once 09/12/16 1820 09/12/16 2152   09/12/16 2200  doxycycline (VIBRA-TABS) tablet 100 mg     100 mg Oral Every 12 hours 09/12/16 2117     09/12/16 1815  piperacillin-tazobactam (ZOSYN) IVPB 3.375 g  Status:  Discontinued     3.375 g 100 mL/hr over  30 Minutes Intravenous  Once 09/12/16 1813 09/12/16 1819   09/12/16 1445  azithromycin (ZITHROMAX) tablet 1,000 mg     1,000 mg Oral  Once 09/12/16 1438 09/12/16 1444   09/12/16 1445  cefTRIAXone (ROCEPHIN) injection 250 mg     250 mg Intramuscular  Once 09/12/16 1438 09/12/16 1447        Subjective: Continues to have severe pain while having a bowel movement and passing flatness. Denies any  acute nausea or vomiting.  Objective: Physical Exam: Vitals:   09/13/16 0459 09/13/16 1313 09/13/16 2123 09/14/16 0515  BP: 110/77 117/89 119/79 119/81  Pulse: 91 84 85 70  Resp: 17  17   Temp: 97.7 F (36.5 C) 97.8 F (36.6 C) 98 F (36.7 C) 97.9 F (36.6 C)  TempSrc: Oral Oral Oral Oral  SpO2: 100% 100% 100% 100%  Weight:      Height:        Intake/Output Summary (Last 24 hours) at 09/14/16 1231 Last data filed at 09/14/16 1100  Gross per 24 hour  Intake           3189.5 ml  Output                0 ml  Net           3189.5 ml   Filed Weights   09/12/16 1155  Weight: 61.7 kg (136 lb)    General: Alert, Awake and Oriented to Time, Place and Person. Appear in moderate distress, affect appropriate Eyes: PERRL, Conjunctiva normal ENT: Oral Mucosa clear moist. Neck: no JVD, no Abnormal Mass Or lumps Cardiovascular: S1 and S2 Present, no Murmur, Respiratory: Bilateral Air entry equal and Decreased, no use of accessory muscle, Clear to Auscultation, no Crackles, no wheezes Abdomen: Bowel Sound present, Soft and suprapubic tenderness Skin: no redness, no Rash, no induration Extremities: no Pedal edema, no calf tenderness Neurologic: Grossly no focal neuro deficit. Bilaterally Equal motor strength  Data Reviewed: CBC:  Recent Labs Lab 09/12/16 1251 09/13/16 0347 09/14/16 0441  WBC 18.0* 14.0* 9.1  NEUTROABS 15.0*  --   --   HGB 12.1 9.2* 9.1*  HCT 35.9* 27.9* 28.1*  MCV 80.9 80.9 81.4  PLT 392 292 316   Basic Metabolic Panel:  Recent Labs Lab 09/12/16 1251 09/13/16 0347 09/14/16 0441  NA 139 136 138  K 3.7 3.7 3.9  CL 104 107 108  CO2 25 24 24   GLUCOSE 89 77 86  BUN <5* <5* <5*  CREATININE 0.84 0.68 0.75  CALCIUM 9.1 7.7* 8.0*    Liver Function Tests:  Recent Labs Lab 09/12/16 1251  AST 14*  ALT 9*  ALKPHOS 61  BILITOT 0.4  PROT 8.0  ALBUMIN 3.8    Recent Labs Lab 09/12/16 1251  LIPASE 17   No results for input(s): AMMONIA in the  last 168 hours. Coagulation Profile: No results for input(s): INR, PROTIME in the last 168 hours. Cardiac Enzymes: No results for input(s): CKTOTAL, CKMB, CKMBINDEX, TROPONINI in the last 168 hours. BNP (last 3 results) No results for input(s): PROBNP in the last 8760 hours.  CBG: No results for input(s): GLUCAP in the last 168 hours.  Studies: No results found.   Scheduled Meds: . amoxicillin-clavulanate  1 tablet Oral Q12H  . doxycycline  100 mg Oral Q12H  . enoxaparin (LOVENOX) injection  40 mg Subcutaneous Q24H  . nicotine  21 mg Transdermal Daily  . polyethylene glycol  17 g Oral Daily  . senna-docusate  1 tablet Oral QHS   Continuous Infusions: . sodium chloride 125 mL/hr at 09/14/16 1149   PRN Meds: acetaminophen **OR** acetaminophen, albuterol, diphenhydrAMINE, ketorolac, morphine injection, ondansetron **OR** ondansetron (ZOFRAN) IV, traMADol-acetaminophen  Time spent: 30 minutes  Author: Lynden Oxford, MD [facility] Pager: [phone] 09/14/2016 12:31 PM  If 7PM-7AM, please contact night-coverage at [email or web address], password [facility]

## 2016-09-14 NOTE — Progress Notes (Signed)
Patient denies allergy to coconut oil. Deleted previous allergy to coconut oil. She denies any allergies to food or medicatons.

## 2016-09-15 MED ORDER — POLYETHYLENE GLYCOL 3350 17 G PO PACK: 17.0000 g | PACK | Freq: Every day | ORAL | 0 refills | Status: DC | PRN

## 2016-09-15 MED ORDER — DOXYCYCLINE HYCLATE 100 MG PO TABS: 100.0000 mg | ORAL_TABLET | Freq: Two times a day (BID) | ORAL | 0 refills | Status: DC

## 2016-09-15 MED ORDER — DOXYCYCLINE HYCLATE 100 MG PO TABS: 100.0000 mg | ORAL_TABLET | Freq: Two times a day (BID) | ORAL | 0 refills | Status: AC

## 2016-09-15 MED ORDER — IBUPROFEN 600 MG PO TABS: 600.0000 mg | ORAL_TABLET | Freq: Three times a day (TID) | ORAL | 0 refills | Status: AC

## 2016-09-15 MED ORDER — AMOXICILLIN-POT CLAVULANATE 875-125 MG PO TABS: 1.0000 | ORAL_TABLET | Freq: Two times a day (BID) | ORAL | 0 refills | Status: AC

## 2016-09-15 MED ORDER — NICOTINE 21 MG/24HR TD PT24: 21.0000 mg | MEDICATED_PATCH | Freq: Every day | TRANSDERMAL | 0 refills | Status: DC

## 2016-09-15 NOTE — Discharge Summary (Addendum)
To whom it concerns, Ms. Lindsey Castillo  [facility]09/16/2015.

## 2016-09-15 NOTE — Care Management Note (Signed)
Case Management Note  Patient Details  Name: Lindsey Castillo MRN: [id number] Date of Birth: 10/05/1990  Subjective/Objective:                    Action/Plan:   Expected Discharge Date:                  Expected Discharge Plan:  Home/Self Care  In-House Referral:     Discharge planning Services  CM Consult, MATCH Program, Medication Assistance  Post Acute Care Choice:    Choice offered to:  Patient  DME Arranged:    DME Agency:     HH Arranged:    HH Agency:     Status of Service:  Completed, signed off  If discussed at [employer] Length of Stay Meetings, dates discussed:    Additional Comments:  [name], [name], RN 09/15/2016, 10:05 AM

## 2016-09-15 NOTE — Progress Notes (Signed)
Pt discharged to home.  Discharge instructions explained to pt.  Pt has no questions at the time of discharge.  Pt states she has all belongings.  IV removed.  Pt ambulated off unit on her own.   

## 2016-09-17 LAB — CULTURE, BLOOD (ROUTINE X 2)
CULTURE: NO GROWTH
Culture: NO GROWTH

## 2016-09-27 NOTE — Discharge Summary (Signed)
[facility] Discharge Summary   Patient: Lindsey Castillo MEQ:[id number]   PCP: Dorrene German, MD DOB: [birth year]   Date of admission: 09/12/2016   Date of discharge: 09/15/2016     Discharge Diagnoses:  Principal Problem:   Gonorrhea in female Active Problems:   SIRS (systemic inflammatory response syndrome) (HCC)   Right lower quadrant abdominal pain   Colitis, acute   Admitted From: home Disposition:  home  Recommendations for Outpatient Follow-up:  1. Follow-up with PCP in one week  Follow-up Information    Dorrene German, MD. Schedule an appointment as soon as possible for a visit in 1 week(s).   Specialty:  Internal Medicine Contact information: [address] [place] [address] [phone]          Diet recommendation: Soft diet  Activity: The patient is advised to gradually reintroduce usual activities.  Discharge Condition: good  Code Status: Full code  History of present illness: As per the H and P dictated on admission, "Lindsey Castillo  is a 26 y.o. female, with past medical hx of asthma, hx of trichomonas at age of 31, active smoker Who presented to the ED with persistent right lower quadrant abdominal pain for the past 1 week. She had pain involving the epigastric and umbilical area starting 3 weeks back. About 2 days after the onset of symptoms he has her menstrual period. The pain was consistent and cramping and then until about a week back it relocated to the right lower quadrant and was localized to the area. The pain was 10/10 in severity, crampy in nature, without radiation, aggravated on minimal exertion including urination, attempting to have bowel movement or passing flatus. Pain would mildly improved with ibuprofen. Patient reports having some mucoid vaginal discharge without any bleeding or pus and non-foul-smelling. She denies any fever, chills, occasional nausea but no vomiting, denies diarrhea but reports being constipated and having to strain  to have a bowel movement. She denies any dysuria, loss of appetite, headache, blurred vision, chest pain, shortness of breath, palpitations, joint pains or muscle aches. Denies any lymph node swelling and rash or weight loss. She denies being on any new medications, sick contact or recent travel. Denies any history of gonorrhea or chlamydia. Reports having a single partner for past 2 years. Denies prior similar history or family history of inflammatory bowel disease."  Hospital Course:   Summary of her active problems in the hospital is as following. 1. Gonorrhea in female Gonorrhea probe positive. Started on doxycycline since admission. Discuss with pharmacy will finish 2 weeks of course. Clinically getting better, no burning urination. Patient also complained about fishy smelling vaginal discharge, given one dose of fluconazole. No evidence of PID on ultrasound. Right ovarian cyst appears to be more likely LUTEAL cyst. Earlier provider D/w Dr. Adrian Blackwater who reviewed images, and it appears that the ovarian findings alone could not explain the level of bowel inflammation seen on CT  2. Colitis. SIRS. Started on IV Zosyn since admission. CT scan shows evidence of colitis. Patient continues to have severe pain when passing flatness or when having a suggestion of a BM. This resolved with stool softener. We continue stool softener. Continue diet. Change antibiotic to Augmentin.  3. Asthma  - Stable. Continue when necessary albuterol inhaler.  4. Tobacco abuse - Smokes one pack per day. Counseled strongly on cessation. Order nicotine patch.  All other chronic medical condition were stable during the hospitalization.  Patient was ambulatory without any assistance. On  the day of the discharge the patient's vitals were stable , and no other acute medical condition were reported by patient. the patient was felt safe to be discharge at home with family.  Procedures and Results:  none    Consultations:  none  DISCHARGE MEDICATION: Discharge Medication List as of 09/15/2016 11:37 AM    START taking these medications   Details  amoxicillin-clavulanate (AUGMENTIN) 875-125 MG tablet Take 1 tablet by mouth every 12 (twelve) hours., Starting Thu 09/15/2016, Until Mon 09/19/2016, Normal    nicotine (NICODERM CQ - DOSED IN MG/24 HOURS) 21 mg/24hr patch Place 1 patch (21 mg total) onto the skin daily., Starting Thu 09/15/2016, Normal    polyethylene glycol (MIRALAX / GLYCOLAX) packet Take 17 g by mouth daily as needed., Starting Thu 09/15/2016, Normal      CONTINUE these medications which have CHANGED   Details  doxycycline (VIBRA-TABS) 100 MG tablet Take 1 tablet (100 mg total) by mouth every 12 (twelve) hours., Starting Thu 09/15/2016, Until Mon 09/26/2016, Normal    ibuprofen (ADVIL,MOTRIN) 600 MG tablet Take 1 tablet (600 mg total) by mouth 3 (three) times daily., Starting Thu 09/15/2016, Until Mon 09/19/2016, Print      CONTINUE these medications which have NOT CHANGED   Details  albuterol (PROVENTIL) (5 MG/ML) 0.5% nebulizer solution Take 0.5 mLs (2.5 mg total) by nebulization every 6 (six) hours as needed., Starting 10/18/2012, Until Discontinued, Normal    ferrous sulfate (FERROUSUL) 325 (65 FE) MG tablet Take 1 tablet (325 mg total) by mouth 2 (two) times daily., Starting Wed 05/27/2015, Print    norgestimate-ethinyl estradiol (ORTHO-CYCLEN,SPRINTEC,PREVIFEM) 0.25-35 MG-MCG tablet Take 3 tablets/day for 3 days, then 2 tablets/day for 3 days, then 1 tablet daily until all the tablets with medicine are completed.  Do not take the placebo pills at the end of the pack, instead start a new pack of pills, taking 1 pill daily., Print    oxymetazoline (AFRIN NASAL SPRAY) 0.05 % nasal spray Place 1 spray into both nostrils 2 (two) times daily. Spray once into each nostril twice daily for up to the next 3 days. Do not use for more than 3 days to prevent rebound rhinorrhea., Starting Tue  08/16/2016, Print    promethazine (PHENERGAN) 25 MG tablet Take 0.5-1 tablets (12.5-25 mg total) by mouth every 6 (six) hours as needed for nausea., Starting Wed 05/27/2015, Normal       No Known Allergies Discharge Instructions    Diet general    Complete by:  As directed    Increase activity slowly    Complete by:  As directed      Discharge Exam: Filed Weights   09/12/16 1155  Weight: 61.7 kg (136 lb)   Vitals:   09/14/16 2115 09/15/16 0446  BP: 127/89 (!) 119/94  Pulse: 77 85  Resp:    Temp: 97.9 F (36.6 C) 97.4 F (36.3 C)   General: Appear in no distress, no Rash; Oral Mucosa moist. Cardiovascular: S1 and S2 Present, no Murmur, no JVD Respiratory: Bilateral Air entry present and Clear to Auscultation, no Crackles, no wheezes Abdomen: Bowel Sound present, Soft and no tenderness Extremities: no Pedal edema, no calf tenderness Neurology: Grossly no focal neuro deficit.  The results of significant diagnostics from this hospitalization (including imaging, microbiology, ancillary and laboratory) are listed below for reference.    Significant Diagnostic Studies: [place] Transvaginal Non-ob  Result Date: 09/12/2016 CLINICAL DATA:  RIGHT lower quadrant pain since 08/24/2016. Elevated white blood cell  count. EXAM: TRANSABDOMINAL AND TRANSVAGINAL ULTRASOUND OF PELVIS DOPPLER ULTRASOUND OF OVARIES TECHNIQUE: Both transabdominal and transvaginal ultrasound examinations of the pelvis were performed. Transabdominal technique was performed for global imaging of the pelvis including uterus, ovaries, adnexal regions, and pelvic cul-de-sac. It was necessary to proceed with endovaginal exam following the transabdominal exam to visualize the ovaries. Color and duplex Doppler ultrasound was utilized to evaluate blood flow to the ovaries. COMPARISON:  CT 09/12/2016 FINDINGS: Uterus Measurements: Normal in size at 7.3 x 3.85 5.1 cm. No fibroids or other mass visualized. Endometrium Thickness: Normal  in thickness at 6 mm. No focal abnormality visualized. Right ovary Measurements: Normal size at 4.654.4 x 3.8 cm. There is a complex hypoechoic 2.7 cm rounded lesion within the RIGHT ovary. Left ovary Measurements: Normal in size at 5.5 x 2.6 x 4.8 cm. Pulsed Doppler evaluation of both ovaries demonstrates normal low-resistance arterial and venous waveforms. Other findings No abnormal free fluid. IMPRESSION: 1. No evidence of ovarian torsion. 2. Mildly complex lesion within the RIGHT ovary likely represents a hemorrhagic cyst or corpus luteal cyst. Tubo-ovarian abscess less favored. 3. Normal uterus. 4. No clear explanation for abdominal pain or elevated white blood cell count on pelvic ultrasound. Electronically Signed   By: Genevive Bi M.D.   On: 09/12/2016 16:07   US Pelvis Complete  Result Date: 09/12/2016 CLINICAL DATA:  RIGHT lower quadrant pain since 08/24/2016. Elevated white blood cell count. EXAM: TRANSABDOMINAL AND TRANSVAGINAL ULTRASOUND OF PELVIS DOPPLER ULTRASOUND OF OVARIES TECHNIQUE: Both transabdominal and transvaginal ultrasound examinations of the pelvis were performed. Transabdominal technique was performed for global imaging of the pelvis including uterus, ovaries, adnexal regions, and pelvic cul-de-sac. It was necessary to proceed with endovaginal exam following the transabdominal exam to visualize the ovaries. Color and duplex Doppler ultrasound was utilized to evaluate blood flow to the ovaries. COMPARISON:  CT 09/12/2016 FINDINGS: Uterus Measurements: Normal in size at 7.3 x 3.85 5.1 cm. No fibroids or other mass visualized. Endometrium Thickness: Normal in thickness at 6 mm. No focal abnormality visualized. Right ovary Measurements: Normal size at 4.654.4 x 3.8 cm. There is a complex hypoechoic 2.7 cm rounded lesion within the RIGHT ovary. Left ovary Measurements: Normal in size at 5.5 x 2.6 x 4.8 cm. Pulsed Doppler evaluation of both ovaries demonstrates normal low-resistance  arterial and venous waveforms. Other findings No abnormal free fluid. IMPRESSION: 1. No evidence of ovarian torsion. 2. Mildly complex lesion within the RIGHT ovary likely represents a hemorrhagic cyst or corpus luteal cyst. Tubo-ovarian abscess less favored. 3. Normal uterus. 4. No clear explanation for abdominal pain or elevated white blood cell count on pelvic ultrasound. Electronically Signed   By: Genevive Bi M.D.   On: 09/12/2016 16:07   Ct Abdomen Pelvis W Contrast  Result Date: 09/12/2016 CLINICAL DATA:  Right lower quadrant abdominal and pelvic pain with leukocytosis. EXAM: CT ABDOMEN AND PELVIS WITH CONTRAST TECHNIQUE: Multidetector CT imaging of the abdomen and pelvis was performed using the standard protocol following bolus administration of intravenous contrast. CONTRAST:  ISOVUE-300 IOPAMIDOL (ISOVUE-300) INJECTION 61% COMPARISON:  None. FINDINGS: Lower chest: No acute abnormality. Hepatobiliary: No focal liver abnormality is seen. No gallstones, gallbladder wall thickening, or biliary dilatation. Pancreas: Unremarkable. No pancreatic ductal dilatation or surrounding inflammatory changes. Spleen: Normal in size without focal abnormality. Adrenals/Urinary Tract: Adrenal glands are unremarkable. Kidneys are normal, without renal calculi, focal lesion, or hydronephrosis. Bladder is unremarkable. Stomach/Bowel: There is a significant amount of inflammation throughout the pelvis abutting distal  ileum, the distal sigmoid colon and the rectum. The distal sigmoid colon and rectum are thickened. Focal secondary to ileus is suspected of the distal ileum. The appendix appears to extend medial to the cecum and does not appear overtly dilated or thickened. No evidence of bowel obstruction or free air. Vascular/Lymphatic: No significant vascular findings are present. No enlarged abdominal or pelvic lymph nodes. Reproductive: Irregular tubular complex fluid is seen to the right of the uterus in the  adnexal region. Internal density is consistent with complex fluid and this may represent tubo-ovarian abscess. This finding as well as the overall epicenter of inflammation in the deep pelvis is suggestive of PID with tubo-ovarian abscess has etiology of inflammation. Additional possibility would be inflammatory bowel disease. Pelvic ultrasound may help to delineate further the presence of tubo-ovarian abscess or dilated fallopian tube. Other: No hernias identified. Musculoskeletal: No acute or significant osseous findings. IMPRESSION: Intense inflammation in the pelvis causing secondary inflammation of multiple abutting bowel loops including the distal ileum, sigmoid colon and rectum. The epicenter of inflammation appears to be near a complex cystic structure in the right adnexal region and most likely etiology is favored to be PID with tubo-ovarian abscess. Inflammatory bowel disease would also be a consideration. This is not felt to represent acute appendicitis as the appendix is visible medial to the cecum and does not appear to be inflamed. Pelvic ultrasound may help to further delineate adnexal pathology. Electronically Signed   By: Irish Lack M.D.   On: 09/12/2016 14:23   [place] Art/ven Flow Abd Pelv Doppler  Result Date: 09/12/2016 CLINICAL DATA:  RIGHT lower quadrant pain since 08/24/2016. Elevated white blood cell count. EXAM: TRANSABDOMINAL AND TRANSVAGINAL ULTRASOUND OF PELVIS DOPPLER ULTRASOUND OF OVARIES TECHNIQUE: Both transabdominal and transvaginal ultrasound examinations of the pelvis were performed. Transabdominal technique was performed for global imaging of the pelvis including uterus, ovaries, adnexal regions, and pelvic cul-de-sac. It was necessary to proceed with endovaginal exam following the transabdominal exam to visualize the ovaries. Color and duplex Doppler ultrasound was utilized to evaluate blood flow to the ovaries. COMPARISON:  CT 09/12/2016 FINDINGS: Uterus Measurements: Normal  in size at 7.3 x 3.85 5.1 cm. No fibroids or other mass visualized. Endometrium Thickness: Normal in thickness at 6 mm. No focal abnormality visualized. Right ovary Measurements: Normal size at 4.654.4 x 3.8 cm. There is a complex hypoechoic 2.7 cm rounded lesion within the RIGHT ovary. Left ovary Measurements: Normal in size at 5.5 x 2.6 x 4.8 cm. Pulsed Doppler evaluation of both ovaries demonstrates normal low-resistance arterial and venous waveforms. Other findings No abnormal free fluid. IMPRESSION: 1. No evidence of ovarian torsion. 2. Mildly complex lesion within the RIGHT ovary likely represents a hemorrhagic cyst or corpus luteal cyst. Tubo-ovarian abscess less favored. 3. Normal uterus. 4. No clear explanation for abdominal pain or elevated white blood cell count on pelvic ultrasound. Electronically Signed   By: Genevive Bi M.D.   On: 09/12/2016 16:07   Time spent: 30 minutes  Signed:  Lynden Oxford  [facility] 09/15/2016 , 5:30 PM

## 2017-04-03 ENCOUNTER — Encounter (HOSPITAL_COMMUNITY): Payer: Self-pay | Admitting: Emergency Medicine

## 2017-04-03 ENCOUNTER — Inpatient Hospital Stay (HOSPITAL_COMMUNITY)
Admission: EM | Admit: 2017-04-03 | Discharge: 2017-04-08 | DRG: 558 | Disposition: A | Discharge status: Home / Self Care | Payer: Self-pay | Attending: Internal Medicine | Admitting: Internal Medicine

## 2017-04-03 DIAGNOSIS — Z825 Family history of asthma and other chronic lower respiratory diseases: Secondary | ICD-10-CM

## 2017-04-03 DIAGNOSIS — J45909 Unspecified asthma, uncomplicated: Secondary | ICD-10-CM | POA: Diagnosis present

## 2017-04-03 DIAGNOSIS — E876 Hypokalemia: Secondary | ICD-10-CM | POA: Diagnosis present

## 2017-04-03 DIAGNOSIS — T796XXA Traumatic ischemia of muscle, initial encounter: Secondary | ICD-10-CM

## 2017-04-03 DIAGNOSIS — Z720 Tobacco use: Secondary | ICD-10-CM

## 2017-04-03 DIAGNOSIS — D649 Anemia, unspecified: Secondary | ICD-10-CM | POA: Diagnosis present

## 2017-04-03 DIAGNOSIS — M6282 Rhabdomyolysis: Principal | ICD-10-CM | POA: Diagnosis present

## 2017-04-03 DIAGNOSIS — N898 Other specified noninflammatory disorders of vagina: Secondary | ICD-10-CM | POA: Diagnosis present

## 2017-04-03 DIAGNOSIS — Z79899 Other long term (current) drug therapy: Secondary | ICD-10-CM

## 2017-04-03 DIAGNOSIS — F17210 Nicotine dependence, cigarettes, uncomplicated: Secondary | ICD-10-CM | POA: Diagnosis present

## 2017-04-03 LAB — COMPREHENSIVE METABOLIC PANEL
ALBUMIN: 4.2 g/dL (ref 3.5–5.0)
ALK PHOS: 50 U/L (ref 38–126)
ALT: 18 U/L (ref 14–54)
AST: 50 U/L — ABNORMAL HIGH (ref 15–41)
Anion gap: 7 (ref 5–15)
BILIRUBIN TOTAL: 0.5 mg/dL (ref 0.3–1.2)
BUN: 6 mg/dL (ref 6–20)
CALCIUM: 8.9 mg/dL (ref 8.9–10.3)
CO2: 23 mmol/L (ref 22–32)
CREATININE: 1.02 mg/dL — AB (ref 0.44–1.00)
Chloride: 109 mmol/L (ref 101–111)
GFR calc Af Amer: >60 mL/min (ref >60)
GFR calc non Af Amer: >60 mL/min (ref >60)
GLUCOSE: 96 mg/dL (ref 65–99)
Potassium: 4.6 mmol/L (ref 3.5–5.1)
Sodium: 139 mmol/L (ref 135–145)
TOTAL PROTEIN: 6.9 g/dL (ref 6.5–8.1)

## 2017-04-03 LAB — CBC
HCT: 34.9 % — ABNORMAL LOW (ref 36.0–46.0)
Hemoglobin: 11.6 g/dL — ABNORMAL LOW (ref 12.0–15.0)
MCH: 26.7 pg (ref 26.0–34.0)
MCHC: 33.2 g/dL (ref 30.0–36.0)
MCV: 80.2 fL (ref 78.0–100.0)
Platelets: 291 10*3/uL (ref 150–400)
RBC: 4.35 MIL/uL (ref 3.87–5.11)
RDW: 18 % — AB (ref 11.5–15.5)
WBC: 10.9 10*3/uL — ABNORMAL HIGH (ref 4.0–10.5)

## 2017-04-03 LAB — URINALYSIS, ROUTINE W REFLEX MICROSCOPIC
Bilirubin Urine: NEGATIVE
GLUCOSE, UA: NEGATIVE mg/dL
Ketones, ur: NEGATIVE mg/dL
Leukocytes, UA: NEGATIVE
NITRITE: NEGATIVE
PH: 5 (ref 5.0–8.0)
PROTEIN: NEGATIVE mg/dL
Specific Gravity, Urine: 1.021 (ref 1.005–1.030)

## 2017-04-03 LAB — LIPASE, BLOOD: Lipase: 26 U/L (ref 11–51)

## 2017-04-03 LAB — MAGNESIUM: Magnesium: 1.9 mg/dL (ref 1.7–2.4)

## 2017-04-03 LAB — I-STAT BETA HCG BLOOD, ED (MC, WL, AP ONLY): I-stat hCG, quantitative: <5 m[IU]/mL (ref <5)

## 2017-04-03 LAB — CK: CK TOTAL: 3931 U/L — AB (ref 38–234)

## 2017-04-03 LAB — PHOSPHORUS: Phosphorus: 3.8 mg/dL (ref 2.5–4.6)

## 2017-04-03 MED ORDER — ENOXAPARIN SODIUM 40 MG/0.4ML ~~LOC~~ SOLN
40.0000 mg | SUBCUTANEOUS | Status: DC
  Administered 2017-04-03 – 2017-04-07 (×5): 40 mg via SUBCUTANEOUS
  Filled 2017-04-03 (×5): qty 0.4

## 2017-04-03 MED ORDER — SODIUM CHLORIDE 0.9 % IV SOLN
INTRAVENOUS | Status: DC
  Administered 2017-04-03 – 2017-04-08 (×12): via INTRAVENOUS

## 2017-04-03 MED ORDER — NICOTINE 21 MG/24HR TD PT24
21.0000 mg | MEDICATED_PATCH | Freq: Every day | TRANSDERMAL | Status: DC
  Administered 2017-04-03 – 2017-04-08 (×6): 21 mg via TRANSDERMAL
  Filled 2017-04-03 (×6): qty 1

## 2017-04-03 MED ORDER — ACETAMINOPHEN 325 MG PO TABS: 650.0000 mg | ORAL_TABLET | Freq: Four times a day (QID) | ORAL | Status: DC | PRN

## 2017-04-03 MED ORDER — ZOLPIDEM TARTRATE 5 MG PO TABS
5.0000 mg | ORAL_TABLET | Freq: Every evening | ORAL | Status: DC | PRN
  Administered 2017-04-03 – 2017-04-07 (×5): 5 mg via ORAL
  Filled 2017-04-03 (×5): qty 1

## 2017-04-03 MED ORDER — ONDANSETRON HCL 4 MG PO TABS
4.0000 mg | ORAL_TABLET | Freq: Four times a day (QID) | ORAL | Status: DC | PRN
  Administered 2017-04-03: 4 mg via ORAL
  Filled 2017-04-03: qty 1

## 2017-04-03 MED ORDER — ACETAMINOPHEN 650 MG RE SUPP: 650.0000 mg | Freq: Four times a day (QID) | RECTAL | Status: DC | PRN

## 2017-04-03 MED ORDER — ALBUTEROL SULFATE (2.5 MG/3ML) 0.083% IN NEBU: 2.5000 mg | INHALATION_SOLUTION | Freq: Four times a day (QID) | RESPIRATORY_TRACT | Status: DC | PRN

## 2017-04-03 MED ORDER — ONDANSETRON HCL 4 MG/2ML IJ SOLN: 4.0000 mg | Freq: Four times a day (QID) | INTRAMUSCULAR | Status: DC | PRN

## 2017-04-03 MED ORDER — PNEUMOCOCCAL VAC POLYVALENT 25 MCG/0.5ML IJ INJ
0.5000 mL | INJECTION | INTRAMUSCULAR | Status: AC
  Administered 2017-04-04: 0.5 mL via INTRAMUSCULAR
  Filled 2017-04-03: qty 0.5

## 2017-04-03 MED ORDER — OXYCODONE-ACETAMINOPHEN 5-325 MG PO TABS
1.0000 | ORAL_TABLET | ORAL | Status: DC | PRN
  Administered 2017-04-03 – 2017-04-08 (×23): 1 via ORAL
  Filled 2017-04-03 (×23): qty 1

## 2017-04-03 MED ORDER — POLYETHYLENE GLYCOL 3350 17 G PO PACK: 17.0000 g | PACK | Freq: Every day | ORAL | Status: DC | PRN

## 2017-04-03 MED ORDER — METHOCARBAMOL 500 MG PO TABS
500.0000 mg | ORAL_TABLET | Freq: Three times a day (TID) | ORAL | Status: DC | PRN
  Administered 2017-04-03 – 2017-04-08 (×14): 500 mg via ORAL
  Filled 2017-04-03 (×15): qty 1

## 2017-04-03 MED ORDER — SODIUM CHLORIDE 0.9 % IV BOLUS (SEPSIS)
1000.0000 mL | Freq: Once | INTRAVENOUS | Status: AC
  Administered 2017-04-03: 1000 mL via INTRAVENOUS

## 2017-04-03 NOTE — ED Provider Notes (Signed)
[facility]-EMERGENCY DEPT Provider Note   CSN: [id number] Arrival date & time: 04/03/17  1544     History   Chief Complaint Chief Complaint  Patient presents with  . Muscle Pain  . Abdominal Pain    HPI Lindsey Castillo is a 26 y.o. female.  HPI Patient presents with diffuse muscle pain. Worse arms and legs. States on Thursday with today being Monday she did abate work out. States around 2 days after she had severe pain. Difficulty with walking and moving her arms. No fevers. Does have some dull abdominal pain also. States her urine is a little dark but not tea colored. No fevers. Also states that she had an extra period. Began a couple days ago. Last menses was on the 11th and was normal. States it is brown and black. No pain. No dysuria. No other vaginal discharge.   Past Medical History:  Diagnosis Date  . Abnormal Pap smear    last pap 07/2012  . Asthma    USES INHALER  . Eczema   . Fracture, ankle AGE 36  . Headache(784.0)   . Infection    trich  . Infection    BV  . Infection    HX OF FREQUENT  . NSVD (normal spontaneous vaginal delivery) 01/01/2013   SVD at 1525  . Seasonal allergies     Patient Active Problem List   Diagnosis Date Noted  . Rhabdomyolysis 04/03/2017  . Anemia 04/03/2017  . Tobacco abuse 04/03/2017  . Vaginal discharge 04/03/2017  . Gonorrhea in female 09/14/2016  . Colitis, acute 09/14/2016  . SIRS (systemic inflammatory response syndrome) (HCC) 09/12/2016  . Right lower quadrant abdominal pain 09/12/2016  . NSVD (normal spontaneous vaginal delivery) 01/01/2013  . Asthma 08/02/2012  . Eczema 08/02/2012    Past Surgical History:  Procedure Laterality Date  . FOOT SURGERY Right    "15 splinters in my foot"    OB History    Gravida Para Term Preterm AB Living   1 1 1     1    SAB TAB Ectopic Multiple Live Births           1       Home Medications    Prior to Admission medications   Medication Sig Start Date End Date Taking?  Authorizing Provider  albuterol (PROVENTIL) (5 MG/ML) 0.5% nebulizer solution Take 0.5 mLs (2.5 mg total) by nebulization every 6 (six) hours as needed. 10/18/12   Silverio Lay, MD  ferrous sulfate (FERROUSUL) 325 (65 FE) MG tablet Take 1 tablet (325 mg total) by mouth 2 (two) times daily. Patient not taking: Reported on 09/12/2016 05/27/15   Leftwich-Kirby, Wilmer Floor, CNM  nicotine (NICODERM CQ - DOSED IN MG/24 HOURS) 21 mg/24hr patch Place 1 patch (21 mg total) onto the skin daily. 09/15/16   Rolly Salter, MD  norgestimate-ethinyl estradiol (ORTHO-CYCLEN,SPRINTEC,PREVIFEM) 0.25-35 MG-MCG tablet Take 3 tablets/day for 3 days, then 2 tablets/day for 3 days, then 1 tablet daily until all the tablets with medicine are completed.  Do not take the placebo pills at the end of the pack, instead start a new pack of pills, taking 1 pill daily. Patient not taking: Reported on 09/12/2016 11/30/15   Marny Lowenstein, PA-C  oxymetazoline (AFRIN NASAL SPRAY) 0.05 % nasal spray Place 1 spray into both nostrils 2 (two) times daily. Spray once into each nostril twice daily for up to the next 3 days. Do not use for more than 3 days to prevent rebound  rhinorrhea. Patient not taking: Reported on 09/12/2016 08/16/16   Barrett Henle, PA-C  polyethylene glycol [facility] / Ethelene Hal) packet Take 17 g by mouth daily as needed. 09/15/16   Rolly Salter, MD  promethazine (PHENERGAN) 25 MG tablet Take 0.5-1 tablets (12.5-25 mg total) by mouth every 6 (six) hours as needed for nausea. Patient not taking: Reported on 11/30/2015 05/27/15   Hurshel Party, CNM    Family History Family History  Problem Relation Age of Onset  . Hypertension Father   . Cancer Maternal Grandmother   . Diabetes Maternal Grandmother   . Depression Mother   . Birth defects Sister        HEART MURMUR  . Asthma Brother   . Miscarriages / [place] Cousin   . Thyroid disease Sister     Social History Social History  Substance Use Topics    . Smoking status: Current Every Day Smoker    Packs/day: 0.50    Years: 7.00    Types: Cigarettes    Last attempt to quit: 05/24/2012  . Smokeless tobacco: Never Used  . Alcohol use 0.0 oz/week     Comment: 09/13/2015 "I'll drink a couple times/week"     Allergies   Patient has no known allergies.   Review of Systems Review of Systems  Constitutional: Negative for appetite change.  HENT: Negative for congestion.   Respiratory: Negative for shortness of breath.   Cardiovascular: Negative for chest pain.  Gastrointestinal: Negative for abdominal pain.  Genitourinary: Positive for vaginal bleeding.  Musculoskeletal: Positive for myalgias.  Skin: Negative for rash and wound.  Neurological: Negative for weakness and numbness.  Hematological: Negative for adenopathy.  Psychiatric/Behavioral: Negative for confusion.     Physical Exam Updated Vital Signs BP 113/86   Pulse 72   Temp 98.8 F (37.1 C) (Oral)   Resp 18   SpO2 100%   Physical Exam  Constitutional: She appears well-developed.  HENT:  Head: Atraumatic.  Neck: Neck supple.  Cardiovascular: Normal rate.   Pulmonary/Chest: Effort normal.  Abdominal: She exhibits no mass. There is tenderness. There is no guarding.  Musculoskeletal:  Arms held partially flexed. Pain over large musculature. Also pain on palpation of thighs. Some abdominal muscular trigger tenderness to.  Neurological: She is alert.  Skin: Skin is warm. Capillary refill takes less than 2 seconds.  Psychiatric: She has a normal mood and affect.     ED Treatments / Results  Labs (all labs ordered are listed, but only abnormal results are displayed) Labs Reviewed  COMPREHENSIVE METABOLIC PANEL - Abnormal; Notable for the following:       Result Value   Creatinine, Ser 1.02 (*)    AST 50 (*)    All other components within normal limits  CBC - Abnormal; Notable for the following:    WBC 10.9 (*)    Hemoglobin 11.6 (*)    HCT 34.9 (*)    RDW  18.0 (*)    All other components within normal limits  URINALYSIS, ROUTINE W REFLEX MICROSCOPIC - Abnormal; Notable for the following:    APPearance HAZY (*)    Hgb urine dipstick MODERATE (*)    Bacteria, UA RARE (*)    Squamous Epithelial / LPF 6-30 (*)    All other components within normal limits  CK - Abnormal; Notable for the following:    Total CK 3,931 (*)    All other components within normal limits  WET PREP, GENITAL  LIPASE, BLOOD  CK  I-STAT BETA HCG BLOOD, ED ([facility], [facility], [facility] ONLY)  GC/CHLAMYDIA PROBE AMP ([place]) NOT AT [facility]    EKG  EKG Interpretation None       Radiology No results found.  Procedures Procedures (including critical care time)  Medications Ordered in ED Medications  0.9 %  sodium chloride infusion (not administered)  nicotine (NICODERM CQ - dosed in mg/24 hours) patch 21 mg (not administered)  polyethylene glycol (MIRALAX / GLYCOLAX) packet 17 g (not administered)  albuterol (PROVENTIL) (5 MG/ML) 0.5% nebulizer solution 2.5 mg (not administered)  methocarbamol (ROBAXIN) tablet 500 mg (not administered)     Initial Impression / Assessment and Plan / ED Course  I have reviewed the triage vital signs and the nursing notes.  Pertinent labs & imaging results that were available during my care of the patient were reviewed by me and considered in my medical decision making (see chart for details).     Patient with rhabdomyolysis from work out. Creatinine minimally elevated. Moderate pain. Will admit to internal medicine. Also had some irregular vaginal bleeding. Not pregnant. Can be worked up as an outpatient.   Final Clinical Impressions(s) / ED Diagnoses   Final diagnoses:  Traumatic rhabdomyolysis, initial encounter [facility](HCC)    New Prescriptions New Prescriptions   No medications on file     Benjiman CorePickering, Briyan Kleven, MD 04/03/17 [phone]

## 2017-04-03 NOTE — H&P (Signed)
History and Physical    Lindsey Castillo JYN:[id number] DOB: [birth year] DOA: 04/03/2017  Referring MD/NP/PA:   PCP: Fleet Contras, MD   Patient coming from:  The patient is coming from home.  At baseline, pt is independent for most of ADL. SNF  Assistant living facility   [facility].       Chief Complaint: Muscle cramps in legs, arms and abdomen  HPI: Lindsey Castillo is a 26 y.o. female with medical history significant of asthma, eczema, seasonal allergy, anemia, tobacco abuse, colitis, who presents with muscle cramps in legs, arms and abdomen.  Patient states that she did exercise in gym on Thursday, then developed muscle cramps and muscle pain in legs, arms and abdomen. Her muscle pain is constant, moderate, diffuse, nonradiating. No injury. Patient has nausea, no vomiting, diarrhea or abdominal pain. She does not have SOB, cough, chest pain, fever or chills. Patient does not have symptoms of UTI. She reports vaginal discharge with brown colored discharges.   ED Course: pt was found to have CK 3931, negative pregnancy test, lipase 26, negative urinalysis, WBC 10.9, creatinine 1.02, AST 50, ALT 18, ALP 50, total bilirubin 0.5, temperature normal, no tachycardia, oxygen saturation 90% on room air. Patient is placed on MedSurg bed for observation.  Review of Systems:   General: no fevers, chills, no changes in body weight, has poor appetite, has fatigue and muscle pain HEENT: no blurry vision, hearing changes or sore throat Respiratory: no dyspnea, coughing, wheezing CV: no chest pain, no palpitations GI: has nausea, no vomiting, abdominal pain, diarrhea, constipation GU: no dysuria, burning on urination, increased urinary frequency, hematuria  Ext: no leg edema Neuro: no unilateral weakness, numbness, or tingling, no vision change or hearing loss Skin: no rash, no skin tear. MSK: No muscle spasm, no deformity, no limitation of range of movement in spin. Has muscle cramps and muscle  pain Heme: No easy bruising.  Travel history: No recent long distant travel.  Allergy: No Known Allergies  Past Medical History:  Diagnosis Date  . Abnormal Pap smear    last pap 07/2012  . Asthma    USES INHALER  . Eczema   . Fracture, ankle AGE 89  . Headache(784.0)   . Infection    trich  . Infection    BV  . Infection    HX OF FREQUENT  . NSVD (normal spontaneous vaginal delivery) 01/01/2013   SVD at 1525  . Seasonal allergies     Past Surgical History:  Procedure Laterality Date  . FOOT SURGERY Right    "15 splinters in my foot"    Social History:  reports that she has been smoking Cigarettes.  She has a 3.50 pack-year smoking history. She has never used smokeless tobacco. She reports that she drinks alcohol. She reports that she uses drugs, including Marijuana.  Family History:  Family History  Problem Relation Age of Onset  . Hypertension Father   . Cancer Maternal Grandmother   . Diabetes Maternal Grandmother   . Depression Mother   . Birth defects Sister        HEART MURMUR  . Asthma Brother   . Miscarriages / [place] Cousin   . Thyroid disease Sister      Prior to Admission medications   Medication Sig Start Date End Date Taking? Authorizing Provider  albuterol (PROVENTIL) (5 MG/ML) 0.5% nebulizer solution Take 0.5 mLs (2.5 mg total) by nebulization every 6 (six) hours as needed. 10/18/12   Silverio Lay,  MD  ferrous sulfate (FERROUSUL) 325 (65 FE) MG tablet Take 1 tablet (325 mg total) by mouth 2 (two) times daily. Patient not taking: Reported on 09/12/2016 05/27/15   Leftwich-Kirby, Wilmer FloorLisa A, CNM  nicotine (NICODERM CQ - DOSED IN MG/24 HOURS) 21 mg/24hr patch Place 1 patch (21 mg total) onto the skin daily. 09/15/16   Rolly SalterPatel, Pranav M, MD  norgestimate-ethinyl estradiol (ORTHO-CYCLEN,SPRINTEC,PREVIFEM) 0.25-35 MG-MCG tablet Take 3 tablets/day for 3 days, then 2 tablets/day for 3 days, then 1 tablet daily until all the tablets with medicine are completed.   Do not take the placebo pills at the end of the pack, instead start a new pack of pills, taking 1 pill daily. Patient not taking: Reported on 09/12/2016 11/30/15   Marny LowensteinWenzel, Julie N, PA-C  oxymetazoline (AFRIN NASAL SPRAY) 0.05 % nasal spray Place 1 spray into both nostrils 2 (two) times daily. Spray once into each nostril twice daily for up to the next 3 days. Do not use for more than 3 days to prevent rebound rhinorrhea. Patient not taking: Reported on 09/12/2016 08/16/16   Barrett HenleNadeau, Nicole Elizabeth, PA-C  polyethylene glycol [facility](MIRALAX / Ethelene HalGLYCOLAX) packet Take 17 g by mouth daily as needed. 09/15/16   Rolly SalterPatel, Pranav M, MD  promethazine (PHENERGAN) 25 MG tablet Take 0.5-1 tablets (12.5-25 mg total) by mouth every 6 (six) hours as needed for nausea. Patient not taking: Reported on 11/30/2015 05/27/15   Hurshel PartyLeftwich-Kirby, Lisa A, CNM    Physical Exam: Vitals:   04/03/17 1930 04/03/17 1945 04/03/17 2000 04/03/17 2033  BP: (!) 109/93 (!) 119/102 121/85 (!) 143/99  Pulse: (!) 176 64 (!) 57 64  Resp:   18 18  Temp:    98 F (36.7 C)  TempSrc:    Oral  SpO2: 100% 100% 100% 100%   General: Not in acute distress HEENT:       Eyes: PERRL, EOMI, no scleral icterus.       ENT: No discharge from the ears and nose, no pharynx injection, no tonsillar enlargement.        Neck: No JVD, no bruit, no mass felt. Heme: No neck lymph node enlargement. Cardiac: S1/S2, RRR, No murmurs, No gallops or rubs. Respiratory: No rales, wheezing, rhonchi or rubs. GI: Soft, nondistended, nontender, no rebound pain, no organomegaly, BS present. GU: No hematuria Ext: No pitting leg edema bilaterally. 2+DP/PT pulse bilaterally. Musculoskeletal: No joint deformities, No joint redness or warmth, no limitation of ROM in spin. Skin: No rashes.  Neuro: Alert, oriented X3, cranial nerves II-XII grossly intact, moves all extremities normally.  Psych: Patient is not psychotic, no suicidal or hemocidal ideation.  Labs on Admission: I have  personally reviewed following labs and imaging studies  CBC:  Recent Labs Lab 04/03/17 1702  WBC 10.9*  HGB 11.6*  HCT 34.9*  MCV 80.2  PLT 291   Basic Metabolic Panel:  Recent Labs Lab 04/03/17 1702  NA 139  K 4.6  CL 109  CO2 23  GLUCOSE 96  BUN 6  CREATININE 1.02*  CALCIUM 8.9   GFR: CrCl cannot be calculated (Unknown ideal weight.). Liver Function Tests:  Recent Labs Lab 04/03/17 1702  AST 50*  ALT 18  ALKPHOS 50  BILITOT 0.5  PROT 6.9  ALBUMIN 4.2    Recent Labs Lab 04/03/17 1702  LIPASE 26   No results for input(s): AMMONIA in the last 168 hours. Coagulation Profile: No results for input(s): INR, PROTIME in the last 168 hours. Cardiac Enzymes:  Recent  Labs Lab 04/03/17 1743  CKTOTAL 3,931*   BNP (last 3 results) No results for input(s): PROBNP in the last 8760 hours. HbA1C: No results for input(s): HGBA1C in the last 72 hours. CBG: No results for input(s): GLUCAP in the last 168 hours. Lipid Profile: No results for input(s): CHOL, HDL, LDLCALC, TRIG, CHOLHDL, LDLDIRECT in the last 72 hours. Thyroid Function Tests: No results for input(s): TSH, T4TOTAL, FREET4, T3FREE, THYROIDAB in the last 72 hours. Anemia Panel: No results for input(s): VITAMINB12, FOLATE, FERRITIN, TIBC, IRON, RETICCTPCT in the last 72 hours. Urine analysis:    Component Value Date/Time   COLORURINE YELLOW 04/03/2017 1758   APPEARANCEUR HAZY (A) 04/03/2017 1758   LABSPEC 1.021 04/03/2017 1758   PHURINE 5.0 04/03/2017 1758   GLUCOSEU NEGATIVE 04/03/2017 1758   HGBUR MODERATE (A) 04/03/2017 1758   BILIRUBINUR NEGATIVE 04/03/2017 1758   KETONESUR NEGATIVE 04/03/2017 1758   PROTEINUR NEGATIVE 04/03/2017 1758   UROBILINOGEN 0.2 05/26/2015 2100   NITRITE NEGATIVE 04/03/2017 1758   LEUKOCYTESUR NEGATIVE 04/03/2017 1758   Sepsis Labs: @LABRCNTIP (procalcitonin:4,lacticidven:4) )No results found for this or any previous visit (from the past 240 hour(s)).    Radiological Exams on Admission: No results found.   EKG:   Not done in ED, will get one.   Assessment/Plan Principal Problem:   Rhabdomyolysis Active Problems:   Asthma   Anemia   Tobacco abuse   Vaginal discharge   Rhabdomyolysis: Patient developed rhabdomyolysis after exercise. CK 6931, creatinine 1.02. Slightly elevated AST at 50, ALT 18, ALP 50, total bilirubin 0.5.  - will place on med-Surg bed for obs - IVF: 1 L of NS bolus, will continue at 150 cc/h.  - repeat CK in AM -check phosphorus and magnesium level  -prn Robaxin for muscle spasm and Zofran for nausea  Asthma: stable -prn albuterol nebulizers  Anemia: Hemoglobin 11.6. Patient is not taking iron supplements currently. -Follow up by CBC  Tobacco abuse: -Did counseling about importance of quitting smoking -Nicotine patch  Vaginal discharge: -check Wet prep -check GC/Chalamydia prob   DVT ppx:  SQ Lovenox Code Status: Full code Family Communication: None at bed side.    Disposition Plan:  Anticipate discharge back to previous home environment Consults called:   Admission status: medical floor/obs        Date of Service 04/03/2017    Lorretta Harp [facility] Pager [phone]  If 7PM-7AM, please contact night-coverage [email or web address] Password [facility] 04/03/2017, 8:38 PM

## 2017-04-03 NOTE — ED Triage Notes (Addendum)
exorcized on Thursday really hard and since then she has had had muscle cramping in arms and legs . she has had 2 periods this month and this last one started Friday  And she has a foul odor and just d/c  And abd cramping

## 2017-04-04 LAB — CBC
HEMATOCRIT: 31.3 % — AB (ref 36.0–46.0)
HEMOGLOBIN: 10.1 g/dL — AB (ref 12.0–15.0)
MCH: 26 pg (ref 26.0–34.0)
MCHC: 32.3 g/dL (ref 30.0–36.0)
MCV: 80.5 fL (ref 78.0–100.0)
Platelets: 261 10*3/uL (ref 150–400)
RBC: 3.89 MIL/uL (ref 3.87–5.11)
RDW: 18.1 % — AB (ref 11.5–15.5)
WBC: 9.9 10*3/uL (ref 4.0–10.5)

## 2017-04-04 LAB — BASIC METABOLIC PANEL
Anion gap: 7 (ref 5–15)
BUN: 6 mg/dL (ref 6–20)
CHLORIDE: 109 mmol/L (ref 101–111)
CO2: 21 mmol/L — AB (ref 22–32)
Calcium: 7.9 mg/dL — ABNORMAL LOW (ref 8.9–10.3)
Creatinine, Ser: 0.72 mg/dL (ref 0.44–1.00)
GFR calc Af Amer: >60 mL/min (ref >60)
GLUCOSE: 79 mg/dL (ref 65–99)
POTASSIUM: 3.4 mmol/L — AB (ref 3.5–5.1)
Sodium: 137 mmol/L (ref 135–145)

## 2017-04-04 LAB — CK: CK TOTAL: 6207 U/L — AB (ref 38–234)

## 2017-04-04 MED ORDER — POTASSIUM CHLORIDE CRYS ER 20 MEQ PO TBCR
20.0000 meq | EXTENDED_RELEASE_TABLET | Freq: Once | ORAL | Status: AC
  Administered 2017-04-04: 20 meq via ORAL
  Filled 2017-04-04: qty 1

## 2017-04-04 MED ORDER — SENNA 8.6 MG PO TABS
1.0000 | ORAL_TABLET | Freq: Every day | ORAL | Status: DC | PRN
  Administered 2017-04-05 – 2017-04-07 (×3): 8.6 mg via ORAL
  Filled 2017-04-04 (×3): qty 1

## 2017-04-04 NOTE — Progress Notes (Signed)
PROGRESS NOTE    Lindsey Castillo  ZOX:[id number] DOB: 1991/01/02 DOA: 04/03/2017 PCP: Fleet Contras, MD   Brief Narrative:  26 year old previously healthy who worked out legs and arms in the same day and presented to the hospital complaining of diffuse muscle aches. Upon further workup was found to have rhabdomyolysis.   Assessment & Plan:   Principal Problem:   Rhabdomyolysis - In plan is to continue fluid rehydration and supportive therapy. Discussed with nursing and patient.  Hypokalemia -replace orally with k dur 20 meq - reassess next am order placed.  Active Problems:   Asthma - Stable no wheezes on exam continue when necessary bronchodilator    Anemia - We'll continue to monitor. I suspect there is some dilutional effect after obtaining IV fluid rehydration.    Tobacco abuse - continue nicotine patch    Vaginal discharge - Recommend patient f/u with obgyn as outpatient if continued discharge reported.  No fevers or leukocytosis as such will defer to outpatient evaluation  DVT prophylaxis:  Code Status: Full Family Communication: d/c patient directly Disposition Plan: Pending improvement in condition   Consultants:   None   Procedures: None  Antimicrobials:   None    Subjective: Pt has no new complaints, no acute issues overnight. Still complaining of diffuse muscle aches  Objective: Vitals:   04/03/17 1945 04/03/17 2000 04/03/17 2033 04/04/17 0607  BP: (!) 119/102 121/85 (!) 143/99 119/75  Pulse: 64 (!) 57 64 77  Resp:  18 18 18   Temp:   98 F (36.7 C) 98 F (36.7 C)  TempSrc:   Oral Oral  SpO2: 100% 100% 100% 100%  Weight:    69.9 kg (154 lb 1.6 oz)  Height:    5\' 6"  (1.676 m)    Intake/Output Summary (Last 24 hours) at 04/04/17 1414 Last data filed at 04/04/17 1227  Gross per 24 hour  Intake             2436 ml  Output             2675 ml  Net             -239 ml   Filed Weights   04/04/17 0607  Weight: 69.9 kg (154 lb 1.6 oz)     Examination:  General exam: Appears calm and looks uncomfortable with movement Respiratory system: Clear to auscultation. Respiratory effort normal. Cardiovascular system: S1 & S2 heard, RRR. No JVD, murmurs, rubs, gallops or clicks. No pedal edema. Gastrointestinal system: Abdomen is nondistended, soft and nontender. No organomegaly or masses felt. Normal bowel sounds heard. Central nervous system: Alert and oriented. No focal neurological deficits. Extremities: Symmetric 5 x 5 power. Skin: No rashes, lesions or ulcers Psychiatry: Judgement and insight appear normal. Mood & affect appropriate.     Data Reviewed: I have personally reviewed following labs and imaging studies  CBC:  Recent Labs Lab 04/03/17 1702 04/04/17 0332  WBC 10.9* 9.9  HGB 11.6* 10.1*  HCT 34.9* 31.3*  MCV 80.2 80.5  PLT 291 261   Basic Metabolic Panel:  Recent Labs Lab 04/03/17 1702 04/03/17 2117 04/04/17 0332  NA 139  --  137  K 4.6  --  3.4*  CL 109  --  109  CO2 23  --  21*  GLUCOSE 96  --  79  BUN 6  --  6  CREATININE 1.02*  --  0.72  CALCIUM 8.9  --  7.9*  MG  --  1.9  --  PHOS  --  3.8  --    GFR: Estimated Creatinine Clearance: 100.6 mL/min (by C-G formula based on SCr of 0.72 mg/dL). Liver Function Tests:  Recent Labs Lab 04/03/17 1702  AST 50*  ALT 18  ALKPHOS 50  BILITOT 0.5  PROT 6.9  ALBUMIN 4.2    Recent Labs Lab 04/03/17 1702  LIPASE 26   No results for input(s): AMMONIA in the last 168 hours. Coagulation Profile: No results for input(s): INR, PROTIME in the last 168 hours. Cardiac Enzymes:  Recent Labs Lab 04/03/17 1743 04/04/17 0332  CKTOTAL 3,931* 6,207*   BNP (last 3 results) No results for input(s): PROBNP in the last 8760 hours. HbA1C: No results for input(s): HGBA1C in the last 72 hours. CBG: No results for input(s): GLUCAP in the last 168 hours. Lipid Profile: No results for input(s): CHOL, HDL, LDLCALC, TRIG, CHOLHDL, LDLDIRECT in  the last 72 hours. Thyroid Function Tests: No results for input(s): TSH, T4TOTAL, FREET4, T3FREE, THYROIDAB in the last 72 hours. Anemia Panel: No results for input(s): VITAMINB12, FOLATE, FERRITIN, TIBC, IRON, RETICCTPCT in the last 72 hours. Sepsis Labs: No results for input(s): PROCALCITON, LATICACIDVEN in the last 168 hours.  No results found for this or any previous visit (from the past 240 hour(s)).       Radiology Studies: No results found.   Scheduled Meds: . enoxaparin (LOVENOX) injection  40 mg Subcutaneous Q24H  . nicotine  21 mg Transdermal Daily   Continuous Infusions: . sodium chloride 150 mL/hr at 04/04/17 0640     LOS: 0 days    Time spent: > 35 minutes  Penny PiaVEGA, Johnice Riebe, MD [facility] Pager [phone]  If 7PM-7AM, please contact night-coverage [email or web address] Password [facility] 04/04/2017, 2:14 PM

## 2017-04-05 DIAGNOSIS — T796XXD Traumatic ischemia of muscle, subsequent encounter: Secondary | ICD-10-CM

## 2017-04-05 LAB — CBC
HCT: 30.5 % — ABNORMAL LOW (ref 36.0–46.0)
HEMOGLOBIN: 9.9 g/dL — AB (ref 12.0–15.0)
MCH: 26.2 pg (ref 26.0–34.0)
MCHC: 32.5 g/dL (ref 30.0–36.0)
MCV: 80.7 fL (ref 78.0–100.0)
Platelets: 241 10*3/uL (ref 150–400)
RBC: 3.78 MIL/uL — AB (ref 3.87–5.11)
RDW: 18.1 % — ABNORMAL HIGH (ref 11.5–15.5)
WBC: 7.5 10*3/uL (ref 4.0–10.5)

## 2017-04-05 LAB — BASIC METABOLIC PANEL
ANION GAP: 6 (ref 5–15)
CO2: 23 mmol/L (ref 22–32)
Calcium: 8.3 mg/dL — ABNORMAL LOW (ref 8.9–10.3)
Chloride: 109 mmol/L (ref 101–111)
Creatinine, Ser: 0.76 mg/dL (ref 0.44–1.00)
Glucose, Bld: 91 mg/dL (ref 65–99)
POTASSIUM: 4.1 mmol/L (ref 3.5–5.1)
SODIUM: 138 mmol/L (ref 135–145)

## 2017-04-05 LAB — CK: CK TOTAL: 6050 U/L — AB (ref 38–234)

## 2017-04-05 NOTE — Progress Notes (Signed)
PROGRESS NOTE    Lindsey Castillo  JYN:[id number] DOB: 11/02/1990 DOA: 04/03/2017 PCP: Fleet ContrasAvbuere, Edwin, MD   Brief Narrative:  26 year old previously healthy who worked out legs and arms in the same day and presented to the hospital complaining of diffuse muscle aches. Upon further workup was found to have rhabdomyolysis.   Assessment & Plan:   Principal Problem:   Rhabdomyolysis - the plan is to continue fluid rehydration and supportive therapy. Discussed with nursing and patient. - Decided to increase fluid rate today. - reassess CK levels next am. Order in place.  Hypokalemia - Within normal limits.  Active Problems:   Asthma - Stable no wheezes on exam continue when necessary bronchodilator    Anemia - We'll continue to monitor. I suspect there is some dilutional effect after obtaining IV fluid rehydration.    Tobacco abuse - continue nicotine patch    Vaginal discharge - Recommend patient f/u with obgyn as outpatient if continued discharge reported.  No fevers or leukocytosis as such will defer to outpatient evaluation  DVT prophylaxis: Lovenox Code Status: Full Family Communication: d/c patient directly Disposition Plan: d/c with improvement in condition (down ward trend of CK) within the next 1-2 days.   Consultants:   None   Procedures: None  Antimicrobials:   None    Subjective: No new complaints reported still has diffuse muscle aches.  Objective: Vitals:   04/04/17 2222 04/04/17 2222 04/05/17 0527 04/05/17 1505  BP: (!) 117/98 (!) 117/98 116/85 125/85  Pulse: 86 86 71 76  Resp: 17 17 17 16   Temp: 98.2 F (36.8 C) 98.2 F (36.8 C) 98.2 F (36.8 C) 98.4 F (36.9 C)  TempSrc: Oral Oral Oral Oral  SpO2: 100% 100% 100% 100%  Weight:      Height:        Intake/Output Summary (Last 24 hours) at 04/05/17 1716 Last data filed at 04/05/17 1700  Gross per 24 hour  Intake          5823.17 ml  Output             3350 ml  Net          2473.17  ml   Filed Weights   04/04/17 0607  Weight: 69.9 kg (154 lb 1.6 oz)    Examination:Exam unchanged when compared to 04/04/2017  General exam: Appears calm and looks uncomfortable with movement Respiratory system: Clear to auscultation. Respiratory effort normal. Cardiovascular system: S1 & S2 heard, RRR. No JVD, murmurs, rubs, gallops or clicks. No pedal edema. Gastrointestinal system: Abdomen is nondistended, soft and nontender. No organomegaly or masses felt. Normal bowel sounds heard. Central nervous system: Alert and oriented. No focal neurological deficits. Extremities: Symmetric 5 x 5 power. Skin: No rashes, lesions or ulcers Psychiatry: Judgement and insight appear normal. Mood & affect appropriate.     Data Reviewed: I have personally reviewed following labs and imaging studies  CBC:  Recent Labs Lab 04/03/17 1702 04/04/17 0332 04/05/17 0437  WBC 10.9* 9.9 7.5  HGB 11.6* 10.1* 9.9*  HCT 34.9* 31.3* 30.5*  MCV 80.2 80.5 80.7  PLT 291 261 241   Basic Metabolic Panel:  Recent Labs Lab 04/03/17 1702 04/03/17 2117 04/04/17 0332 04/05/17 0437  NA 139  --  137 138  K 4.6  --  3.4* 4.1  CL 109  --  109 109  CO2 23  --  21* 23  GLUCOSE 96  --  79 91  BUN 6  --  6 <5*  CREATININE 1.02*  --  0.72 0.76  CALCIUM 8.9  --  7.9* 8.3*  MG  --  1.9  --   --   PHOS  --  3.8  --   --    GFR: Estimated Creatinine Clearance: 100.6 mL/min (by C-G formula based on SCr of 0.76 mg/dL). Liver Function Tests:  Recent Labs Lab 04/03/17 1702  AST 50*  ALT 18  ALKPHOS 50  BILITOT 0.5  PROT 6.9  ALBUMIN 4.2    Recent Labs Lab 04/03/17 1702  LIPASE 26   No results for input(s): AMMONIA in the last 168 hours. Coagulation Profile: No results for input(s): INR, PROTIME in the last 168 hours. Cardiac Enzymes:  Recent Labs Lab 04/03/17 1743 04/04/17 0332 04/05/17 0437  CKTOTAL 3,931* 6,207* 6,050*   BNP (last 3 results) No results for input(s): PROBNP in the  last 8760 hours. HbA1C: No results for input(s): HGBA1C in the last 72 hours. CBG: No results for input(s): GLUCAP in the last 168 hours. Lipid Profile: No results for input(s): CHOL, HDL, LDLCALC, TRIG, CHOLHDL, LDLDIRECT in the last 72 hours. Thyroid Function Tests: No results for input(s): TSH, T4TOTAL, FREET4, T3FREE, THYROIDAB in the last 72 hours. Anemia Panel: No results for input(s): VITAMINB12, FOLATE, FERRITIN, TIBC, IRON, RETICCTPCT in the last 72 hours. Sepsis Labs: No results for input(s): PROCALCITON, LATICACIDVEN in the last 168 hours.  No results found for this or any previous visit (from the past 240 hour(s)).    Radiology Studies: No results found.   Scheduled Meds: . enoxaparin (LOVENOX) injection  40 mg Subcutaneous Q24H  . nicotine  21 mg Transdermal Daily   Continuous Infusions: . sodium chloride 200 mL/hr (04/05/17 1140)     LOS: 1 day    Time spent: > 35 minutes  Penny PiaVEGA, Macall Mccroskey, MD [facility] Pager [phone]  If 7PM-7AM, please contact night-coverage [email or web address] Password [email or web address] 04/05/2017, 5:16 PM

## 2017-04-06 DIAGNOSIS — T796XXS Traumatic ischemia of muscle, sequela: Secondary | ICD-10-CM

## 2017-04-06 DIAGNOSIS — J4520 Mild intermittent asthma, uncomplicated: Secondary | ICD-10-CM

## 2017-04-06 LAB — CBC
HEMATOCRIT: 30.3 % — AB (ref 36.0–46.0)
Hemoglobin: 10 g/dL — ABNORMAL LOW (ref 12.0–15.0)
MCH: 26.5 pg (ref 26.0–34.0)
MCHC: 33 g/dL (ref 30.0–36.0)
MCV: 80.4 fL (ref 78.0–100.0)
PLATELETS: 239 10*3/uL (ref 150–400)
RBC: 3.77 MIL/uL — AB (ref 3.87–5.11)
RDW: 18.1 % — ABNORMAL HIGH (ref 11.5–15.5)
WBC: 7.2 10*3/uL (ref 4.0–10.5)

## 2017-04-06 LAB — CK: Total CK: 6040 U/L — ABNORMAL HIGH (ref 38–234)

## 2017-04-06 NOTE — Progress Notes (Addendum)
Patient ID: Lindsey Castillo, female   DOB: 06/18/1991, 26 y.o.   MRN: [id number]  PROGRESS NOTE    Lindsey Castillo  WUJ:[id number] DOB: 11/27/1990 DOA: 04/03/2017  PCP: Fleet ContrasAvbuere, Edwin, MD   Brief Narrative:  26 year old female who presented to ED with diffuse muscle aches after exercising. Her CK level is in 6000 range.   Assessment & Plan:   Principal Problem:   Rhabdomyolysis - Exercise induced rhabdomyolysis  - CK still in 6000 range - Continue IV fluids - Follow up CK level in am  Active Problems:   Asthma - Stable - Not in acute exacerbation   DVT prophylaxis: Lovenox subQ Code Status: full code  Family Communication: no family at the bedside this am Disposition Plan: home once CK level closer to 2000 range    Consultants:   None  Procedures:   None  Antimicrobials:   None   Subjective: No overnight events.  Objective: Vitals:   04/05/17 0527 04/05/17 1505 04/05/17 2111 04/06/17 0631  BP: 116/85 125/85 (!) 127/92 119/77  Pulse: 71 76 79 71  Resp: 17 16 17 18   Temp: 98.2 F (36.8 C) 98.4 F (36.9 C) (!) 97.5 F (36.4 C) 98 F (36.7 C)  TempSrc: Oral Oral Axillary Oral  SpO2: 100% 100% 100% 100%  Weight:      Height:        Intake/Output Summary (Last 24 hours) at 04/06/17 0951 Last data filed at 04/06/17 0700  Gross per 24 hour  Intake          2318.17 ml  Output             3500 ml  Net         -1181.83 ml   Filed Weights   04/04/17 0607  Weight: 69.9 kg (154 lb 1.6 oz)    Examination:  General exam: Appears calm and comfortable  Respiratory system: Clear to auscultation. Respiratory effort normal. Cardiovascular system: S1 & S2 heard, RRR. No JVD, murmurs, rubs, gallops or clicks. No pedal edema. Gastrointestinal system: Abdomen is nondistended, soft and nontender. No organomegaly or masses felt. Normal bowel sounds heard. Central nervous system: Alert and oriented. No focal neurological deficits. Extremities: Symmetric 5 x 5  power. Skin: No rashes, lesions or ulcers Psychiatry: Judgement and insight appear normal. Mood & affect appropriate.   Data Reviewed: I have personally reviewed following labs and imaging studies  CBC:  Recent Labs Lab 04/03/17 1702 04/04/17 0332 04/05/17 0437 04/06/17 0320  WBC 10.9* 9.9 7.5 7.2  HGB 11.6* 10.1* 9.9* 10.0*  HCT 34.9* 31.3* 30.5* 30.3*  MCV 80.2 80.5 80.7 80.4  PLT 291 261 241 239   Basic Metabolic Panel:  Recent Labs Lab 04/03/17 1702 04/03/17 2117 04/04/17 0332 04/05/17 0437  NA 139  --  137 138  K 4.6  --  3.4* 4.1  CL 109  --  109 109  CO2 23  --  21* 23  GLUCOSE 96  --  79 91  BUN 6  --  6 <5*  CREATININE 1.02*  --  0.72 0.76  CALCIUM 8.9  --  7.9* 8.3*  MG  --  1.9  --   --   PHOS  --  3.8  --   --    GFR: Estimated Creatinine Clearance: 100.6 mL/min (by C-G formula based on SCr of 0.76 mg/dL). Liver Function Tests:  Recent Labs Lab 04/03/17 1702  AST 50*  ALT 18  ALKPHOS 50  BILITOT 0.5  PROT 6.9  ALBUMIN 4.2    Recent Labs Lab 04/03/17 1702  LIPASE 26   No results for input(s): AMMONIA in the last 168 hours. Coagulation Profile: No results for input(s): INR, PROTIME in the last 168 hours. Cardiac Enzymes:  Recent Labs Lab 04/03/17 1743 04/04/17 0332 04/05/17 0437 04/06/17 0320  CKTOTAL 3,931* 6,207* 6,050* 6,040*   BNP (last 3 results) No results for input(s): PROBNP in the last 8760 hours. HbA1C: No results for input(s): HGBA1C in the last 72 hours. CBG: No results for input(s): GLUCAP in the last 168 hours. Lipid Profile: No results for input(s): CHOL, HDL, LDLCALC, TRIG, CHOLHDL, LDLDIRECT in the last 72 hours. Thyroid Function Tests: No results for input(s): TSH, T4TOTAL, FREET4, T3FREE, THYROIDAB in the last 72 hours. Anemia Panel: No results for input(s): VITAMINB12, FOLATE, FERRITIN, TIBC, IRON, RETICCTPCT in the last 72 hours. Urine analysis:    Component Value Date/Time   COLORURINE YELLOW  04/03/2017 1758   APPEARANCEUR HAZY (A) 04/03/2017 1758   LABSPEC 1.021 04/03/2017 1758   PHURINE 5.0 04/03/2017 1758   GLUCOSEU NEGATIVE 04/03/2017 1758   HGBUR MODERATE (A) 04/03/2017 1758   BILIRUBINUR NEGATIVE 04/03/2017 1758   KETONESUR NEGATIVE 04/03/2017 1758   PROTEINUR NEGATIVE 04/03/2017 1758   UROBILINOGEN 0.2 05/26/2015 2100   NITRITE NEGATIVE 04/03/2017 1758   LEUKOCYTESUR NEGATIVE 04/03/2017 1758   Sepsis Labs: @LABRCNTIP (procalcitonin:4,lacticidven:4)   )No results found for this or any previous visit (from the past 240 hour(s)).    Radiology Studies: No results found.    Scheduled Meds: . enoxaparin (LOVENOX) injection  40 mg Subcutaneous Q24H  . nicotine  21 mg Transdermal Daily   Continuous Infusions: . sodium chloride 200 mL/hr at 04/05/17 1915     LOS: 2 days    Time spent: 25 minutes  Greater than 50% of the time spent on counseling and coordinating the care.   Manson PasseyAlma Bernerd Terhune, MD [facility] Pager [phone]  If 7PM-7AM, please contact night-coverage [email or web address] Password [email or web address] 04/06/2017, 9:51 AM

## 2017-04-07 DIAGNOSIS — M6282 Rhabdomyolysis: Principal | ICD-10-CM

## 2017-04-07 LAB — CBC
HCT: 31.1 % — ABNORMAL LOW (ref 36.0–46.0)
Hemoglobin: 10.2 g/dL — ABNORMAL LOW (ref 12.0–15.0)
MCH: 26.2 pg (ref 26.0–34.0)
MCHC: 32.8 g/dL (ref 30.0–36.0)
MCV: 79.9 fL (ref 78.0–100.0)
PLATELETS: 241 10*3/uL (ref 150–400)
RBC: 3.89 MIL/uL (ref 3.87–5.11)
RDW: 17.9 % — AB (ref 11.5–15.5)
WBC: 6.4 10*3/uL (ref 4.0–10.5)

## 2017-04-07 LAB — CK: CK TOTAL: 3535 U/L — AB (ref 38–234)

## 2017-04-07 MED ORDER — POLYETHYLENE GLYCOL 3350 17 G PO PACK
17.0000 g | PACK | Freq: Every day | ORAL | Status: DC
  Administered 2017-04-07 – 2017-04-08 (×2): 17 g via ORAL
  Filled 2017-04-07 (×2): qty 1

## 2017-04-07 NOTE — Progress Notes (Signed)
Patient ID: Lindsey Castillo, female   DOB: 05/27/1991, 26 y.o.   MRN: [id number]  PROGRESS NOTE    Lindsey Castillo  WUJ:[id number] DOB: 11/16/1990 DOA: 04/03/2017  PCP: Fleet ContrasAvbuere, Edwin, MD   Brief Narrative:  26 year old female who presented to ED with diffuse muscle aches after exercising. Her CK level is in 6000 range.   Assessment & Plan:   Principal Problem:   Rhabdomyolysis - Exercise induced - Creatinine kinase in 3000 range - Continue IV fluids  Active Problems:   Asthma - Not in acute exacerbation   DVT prophylaxis: Lovenox subcutaneous Code Status: full code  Family Communication: Family not at the bedside this morning Disposition Plan: Home in a.m. if CK level trends further down   Consultants:   None  Procedures:   None  Antimicrobials:   None   Subjective: Still experiences muscle aches in legs.  Objective: Vitals:   04/06/17 0631 04/06/17 1456 04/06/17 2210 04/07/17 0552  BP: 119/77 125/86 134/90 124/76  Pulse: 71 75 72 (!) 57  Resp: 18 20 18 18   Temp: 98 F (36.7 C) 98.3 F (36.8 C) 98.3 F (36.8 C) 98.2 F (36.8 C)  TempSrc: Oral Oral Oral Oral  SpO2: 100% 100% 100% 100%  Weight:      Height:        Intake/Output Summary (Last 24 hours) at 04/07/17 0805 Last data filed at 04/07/17 [id number]  Gross per 24 hour  Intake          4626.67 ml  Output             4500 ml  Net           126.67 ml   Filed Weights   04/04/17 0607  Weight: 69.9 kg (154 lb 1.6 oz)    Physical Exam  Constitutional: Appears well-developed and well-nourished. No distress.  CVS: RRR, S1/S2 +, no murmurs, no gallops, no carotid bruit.  Pulmonary: Effort and breath sounds normal, no stridor, rhonchi, wheezes, rales.  Abdominal: Soft. BS +,  no distension, tenderness, rebound or guarding.  Musculoskeletal: Normal range of motion. No edema and no tenderness.  Lymphadenopathy: No lymphadenopathy noted, cervical, inguinal. Neuro: Alert. Normal reflexes, muscle tone  coordination. No cranial nerve deficit. Skin: Skin is warm and dry. No rash noted. Not diaphoretic. No erythema. No pallor.  Psychiatric: Normal mood and affect. Behavior, judgment, thought content normal.     Data Reviewed: I have personally reviewed following labs and imaging studies  CBC:  Recent Labs Lab 04/03/17 1702 04/04/17 0332 04/05/17 0437 04/06/17 0320 04/07/17 0439  WBC 10.9* 9.9 7.5 7.2 6.4  HGB 11.6* 10.1* 9.9* 10.0* 10.2*  HCT 34.9* 31.3* 30.5* 30.3* 31.1*  MCV 80.2 80.5 80.7 80.4 79.9  PLT 291 261 241 239 241   Basic Metabolic Panel:  Recent Labs Lab 04/03/17 1702 04/03/17 2117 04/04/17 0332 04/05/17 0437  NA 139  --  137 138  K 4.6  --  3.4* 4.1  CL 109  --  109 109  CO2 23  --  21* 23  GLUCOSE 96  --  79 91  BUN 6  --  6 <5*  CREATININE 1.02*  --  0.72 0.76  CALCIUM 8.9  --  7.9* 8.3*  MG  --  1.9  --   --   PHOS  --  3.8  --   --    GFR: Estimated Creatinine Clearance: 100.6 mL/min (by C-G formula based on SCr of  0.76 mg/dL). Liver Function Tests:  Recent Labs Lab 04/03/17 1702  AST 50*  ALT 18  ALKPHOS 50  BILITOT 0.5  PROT 6.9  ALBUMIN 4.2    Recent Labs Lab 04/03/17 1702  LIPASE 26   No results for input(s): AMMONIA in the last 168 hours. Coagulation Profile: No results for input(s): INR, PROTIME in the last 168 hours. Cardiac Enzymes:  Recent Labs Lab 04/03/17 1743 04/04/17 0332 04/05/17 0437 04/06/17 0320 04/07/17 0439  CKTOTAL 3,931* 6,207* 6,050* 6,040* 3,535*   BNP (last 3 results) No results for input(s): PROBNP in the last 8760 hours. HbA1C: No results for input(s): HGBA1C in the last 72 hours. CBG: No results for input(s): GLUCAP in the last 168 hours. Lipid Profile: No results for input(s): CHOL, HDL, LDLCALC, TRIG, CHOLHDL, LDLDIRECT in the last 72 hours. Thyroid Function Tests: No results for input(s): TSH, T4TOTAL, FREET4, T3FREE, THYROIDAB in the last 72 hours. Anemia Panel: No results for  input(s): VITAMINB12, FOLATE, FERRITIN, TIBC, IRON, RETICCTPCT in the last 72 hours. Urine analysis:    Component Value Date/Time   COLORURINE YELLOW 04/03/2017 1758   APPEARANCEUR HAZY (A) 04/03/2017 1758   LABSPEC 1.021 04/03/2017 1758   PHURINE 5.0 04/03/2017 1758   GLUCOSEU NEGATIVE 04/03/2017 1758   HGBUR MODERATE (A) 04/03/2017 1758   BILIRUBINUR NEGATIVE 04/03/2017 1758   KETONESUR NEGATIVE 04/03/2017 1758   PROTEINUR NEGATIVE 04/03/2017 1758   UROBILINOGEN 0.2 05/26/2015 2100   NITRITE NEGATIVE 04/03/2017 1758   LEUKOCYTESUR NEGATIVE 04/03/2017 1758   Sepsis Labs: @LABRCNTIP (procalcitonin:4,lacticidven:4)   )No results found for this or any previous visit (from the past 240 hour(s)).    Radiology Studies: No results found.    Scheduled Meds: . enoxaparin (LOVENOX) injection  40 mg Subcutaneous Q24H  . nicotine  21 mg Transdermal Daily   Continuous Infusions: . sodium chloride 200 mL/hr at 04/07/17 0313     LOS: 3 days    Time spent: 25 minutes  Greater than 50% of the time spent on counseling and coordinating the care.   Manson PasseyAlma Jonte Wollam, MD [facility] Pager [phone]  If 7PM-7AM, please contact night-coverage [email or web address] Password [email or web address] 04/07/2017, 8:05 AM

## 2017-04-08 LAB — CK: CK TOTAL: 1678 U/L — AB (ref 38–234)

## 2017-04-08 MED ORDER — METRONIDAZOLE 500 MG PO TABS: 500.0000 mg | ORAL_TABLET | Freq: Two times a day (BID) | ORAL | 0 refills | Status: DC

## 2017-04-08 MED ORDER — METRONIDAZOLE 500 MG PO TABS
500.0000 mg | ORAL_TABLET | Freq: Once | ORAL | Status: AC
  Administered 2017-04-08: 500 mg via ORAL
  Filled 2017-04-08: qty 1

## 2017-04-08 NOTE — Discharge Summary (Addendum)
Physician Discharge Summary  Lindsey Castillo [id number]:[id number] DOB: 02/02/1991 DOA: 04/03/2017  PCP: Fleet ContrasAvbuere, Edwin, MD  Admit date: 04/03/2017 Discharge date: 04/08/2017  Recommendations for Outpatient Follow-up:  Please follow up with PCP in 1 week and recheck CK level CK on discharge is 1600 Flagyl for 7 days for BV  Discharge Diagnoses:  Principal Problem:   Rhabdomyolysis Active Problems:   Asthma   Anemia   Tobacco abuse   Vaginal discharge    Discharge Condition: stable   Diet recommendation: as tolerated   History of present illness:  26 year old female who presented to ED with diffuse muscle aches after exercising. Her CK level is in 6000 range.  Hospital Course:   Principal Problem:   Rhabdomyolysis - exercise induced - Ck now in 1600 range - May go home today; she is okay with the discharge plan - Would use tylenol as needed for pain relief  Active Problems:   Asthma - Stable  DVT prophylaxis: Lovenox subQ Code Status: full code  Family Communication: no family at the bedside this am    Consultants:   None  Procedures:   None  Antimicrobials:   None  Signed:  Manson PasseyAlma Swade Shonka, MD  [facility] 04/08/2017, 10:24 AM  Pager #: [phone]  Time spent in minutes: less than 30 minutes    Discharge Exam: Vitals:   04/07/17 2135 04/08/17 0515  BP: 130/70 114/80  Pulse: 70 64  Resp: 18 18  Temp: 98.7 F (37.1 C) 97.7 F (36.5 C)   Vitals:   04/07/17 0552 04/07/17 1600 04/07/17 2135 04/08/17 0515  BP: 124/76 120/70 130/70 114/80  Pulse: (!) 57 68 70 64  Resp: 18  18 18   Temp: 98.2 F (36.8 C) 98.5 F (36.9 C) 98.7 F (37.1 C) 97.7 F (36.5 C)  TempSrc: Oral Oral Oral Oral  SpO2: 100% 100% 100% 100%  Weight:    68.4 kg (150 lb 12.8 oz)  Height:        General: Pt is alert, follows commands appropriately, not in acute distress Cardiovascular: Regular rate and rhythm, S1/S2 +, no murmurs Respiratory: Clear to auscultation  bilaterally, no wheezing, no crackles, no rhonchi Abdominal: Soft, non tender, non distended, bowel sounds +, no guarding Extremities: no edema, no cyanosis, pulses palpable bilaterally DP and PT Neuro: Grossly nonfocal  Discharge Instructions  Discharge Instructions    Call MD for:  persistant nausea and vomiting    Complete by:  As directed    Call MD for:  redness, tenderness, or signs of infection (pain, swelling, redness, odor or green/yellow discharge around incision site)    Complete by:  As directed    Call MD for:  severe uncontrolled pain    Complete by:  As directed    Diet - low sodium heart healthy    Complete by:  As directed    Discharge instructions    Complete by:  As directed    Please follow up with PCP in 1 week and recheck CK level CK on discharge is 1600   Increase activity slowly    Complete by:  As directed      Allergies as of 04/08/2017   No Known Allergies     Medication List    TAKE these medications   acetaminophen 500 MG tablet Commonly known as:  TYLENOL Take 1,000 mg by mouth every 6 (six) hours as needed for mild pain.   metroNIDAZOLE 500 MG tablet Commonly known as:  FLAGYL Take  1 tablet (500 mg total) by mouth 2 (two) times daily.   naproxen sodium 220 MG tablet Commonly known as:  ANAPROX Take 220 mg by mouth daily as needed (general pain).       Follow-up Information    Fleet ContrasAvbuere, Edwin, MD. Schedule an appointment as soon as possible for a visit in 1 week(s).   Specialty:  Internal Medicine Contact information: [address] [place] [address] [phone]            The results of significant diagnostics from this hospitalization (including imaging, microbiology, ancillary and laboratory) are listed below for reference.    Significant Diagnostic Studies: No results found.  Microbiology: No results found for this or any previous visit (from the past 240 hour(s)).   Labs: Basic Metabolic Panel:  Recent  Labs Lab 04/03/17 1702 04/03/17 2117 04/04/17 0332 04/05/17 0437  NA 139  --  137 138  K 4.6  --  3.4* 4.1  CL 109  --  109 109  CO2 23  --  21* 23  GLUCOSE 96  --  79 91  BUN 6  --  6 <5*  CREATININE 1.02*  --  0.72 0.76  CALCIUM 8.9  --  7.9* 8.3*  MG  --  1.9  --   --   PHOS  --  3.8  --   --    Liver Function Tests:  Recent Labs Lab 04/03/17 1702  AST 50*  ALT 18  ALKPHOS 50  BILITOT 0.5  PROT 6.9  ALBUMIN 4.2    Recent Labs Lab 04/03/17 1702  LIPASE 26   No results for input(s): AMMONIA in the last 168 hours. CBC:  Recent Labs Lab 04/03/17 1702 04/04/17 0332 04/05/17 0437 04/06/17 0320 04/07/17 0439  WBC 10.9* 9.9 7.5 7.2 6.4  HGB 11.6* 10.1* 9.9* 10.0* 10.2*  HCT 34.9* 31.3* 30.5* 30.3* 31.1*  MCV 80.2 80.5 80.7 80.4 79.9  PLT 291 261 241 239 241   Cardiac Enzymes:  Recent Labs Lab 04/04/17 0332 04/05/17 0437 04/06/17 0320 04/07/17 0439 04/08/17 0839  CKTOTAL 6,207* 6,050* 6,040* 3,535* 1,678*   BNP: BNP (last 3 results) No results for input(s): BNP in the last 8760 hours.  ProBNP (last 3 results) No results for input(s): PROBNP in the last 8760 hours.  CBG: No results for input(s): GLUCAP in the last 168 hours.

## 2017-04-08 NOTE — Progress Notes (Signed)
Nsg Discharge Note  Admit Date:  04/03/2017 Discharge date: 04/08/2017   Lindsey Castillo to be D/C'd Home per MD order.  AVS completed.  Copy for chart, and copy for patient signed, and dated. Patient/caregiver able to verbalize understanding.  Discharge Medication: Allergies as of 04/08/2017   No Known Allergies     Medication List    TAKE these medications   acetaminophen 500 MG tablet Commonly known as:  TYLENOL Take 1,000 mg by mouth every 6 (six) hours as needed for mild pain.   metroNIDAZOLE 500 MG tablet Commonly known as:  FLAGYL Take 1 tablet (500 mg total) by mouth 2 (two) times daily.   naproxen sodium 220 MG tablet Commonly known as:  ANAPROX Take 220 mg by mouth daily as needed (general pain).       Discharge Assessment: Vitals:   04/07/17 2135 04/08/17 0515  BP: 130/70 114/80  Pulse: 70 64  Resp: 18 18  Temp: 98.7 F (37.1 C) 97.7 F (36.5 C)   Skin clean, dry and intact without evidence of skin break down, no evidence of skin tears noted. IV catheter discontinued intact. Site without signs and symptoms of complications - no redness or edema noted at insertion site, patient denies c/o pain - only slight tenderness at site.  Dressing with slight pressure applied.  D/c Instructions-Education: Discharge instructions given to patient/family with verbalized understanding. D/c education completed with patient/family including follow up instructions, medication list, d/c activities limitations if indicated, with other d/c instructions as indicated by MD - patient able to verbalize understanding, all questions fully answered. Patient instructed to return to ED, call 911, or call MD for any changes in condition.  Patient escorted via WC, and D/C home via private auto.  Lindsey FlamingVicki L [name], RN 04/08/2017 12:44 PM

## 2017-06-21 ENCOUNTER — Emergency Department (HOSPITAL_COMMUNITY)
Admission: EM | Admit: 2017-06-21 | Discharge: 2017-06-21 | Disposition: A | Discharge status: Home / Self Care | Payer: Self-pay | Attending: Emergency Medicine | Admitting: Emergency Medicine

## 2017-06-21 ENCOUNTER — Encounter (HOSPITAL_COMMUNITY): Payer: Self-pay | Admitting: Emergency Medicine

## 2017-06-21 DIAGNOSIS — J45909 Unspecified asthma, uncomplicated: Secondary | ICD-10-CM | POA: Insufficient documentation

## 2017-06-21 DIAGNOSIS — R350 Frequency of micturition: Secondary | ICD-10-CM | POA: Insufficient documentation

## 2017-06-21 DIAGNOSIS — R400 Somnolence: Secondary | ICD-10-CM | POA: Insufficient documentation

## 2017-06-21 DIAGNOSIS — R55 Syncope and collapse: Secondary | ICD-10-CM | POA: Insufficient documentation

## 2017-06-21 DIAGNOSIS — F17210 Nicotine dependence, cigarettes, uncomplicated: Secondary | ICD-10-CM | POA: Insufficient documentation

## 2017-06-21 LAB — CBC
HCT: 34.2 % — ABNORMAL LOW (ref 36.0–46.0)
Hemoglobin: 11.1 g/dL — ABNORMAL LOW (ref 12.0–15.0)
MCH: 26.6 pg (ref 26.0–34.0)
MCHC: 32.5 g/dL (ref 30.0–36.0)
MCV: 82 fL (ref 78.0–100.0)
PLATELETS: 265 10*3/uL (ref 150–400)
RBC: 4.17 MIL/uL (ref 3.87–5.11)
RDW: 20.1 % — ABNORMAL HIGH (ref 11.5–15.5)
WBC: 7.4 10*3/uL (ref 4.0–10.5)

## 2017-06-21 LAB — BASIC METABOLIC PANEL WITH GFR
Anion gap: 7 (ref 5–15)
BUN: <5 mg/dL — ABNORMAL LOW (ref 6–20)
CO2: 25 mmol/L (ref 22–32)
Calcium: 8.7 mg/dL — ABNORMAL LOW (ref 8.9–10.3)
Chloride: 107 mmol/L (ref 101–111)
Creatinine, Ser: 0.74 mg/dL (ref 0.44–1.00)
GFR calc Af Amer: >60 mL/min
GFR calc non Af Amer: >60 mL/min
Glucose, Bld: 98 mg/dL (ref 65–99)
Potassium: 4.3 mmol/L (ref 3.5–5.1)
Sodium: 139 mmol/L (ref 135–145)

## 2017-06-21 LAB — URINALYSIS, ROUTINE W REFLEX MICROSCOPIC
Bilirubin Urine: NEGATIVE
Glucose, UA: NEGATIVE mg/dL
Ketones, ur: NEGATIVE mg/dL
Leukocytes, UA: NEGATIVE
Nitrite: NEGATIVE
Protein, ur: NEGATIVE mg/dL
Specific Gravity, Urine: 1.016 (ref 1.005–1.030)
Squamous Epithelial / HPF: NONE SEEN
pH: 6 (ref 5.0–8.0)

## 2017-06-21 LAB — I-STAT TROPONIN, ED: Troponin i, poc: 0 ng/mL (ref 0.00–0.08)

## 2017-06-21 LAB — POC URINE PREG, ED: PREG TEST UR: NEGATIVE

## 2017-06-21 MED ORDER — CALCIUM CARBONATE-VITAMIN D 500-200 MG-UNIT PO TABS: 1.0000 | ORAL_TABLET | Freq: Two times a day (BID) | ORAL | 0 refills | Status: DC

## 2017-06-21 MED ORDER — FERROUS SULFATE 325 (65 FE) MG PO TABS: 325.0000 mg | ORAL_TABLET | Freq: Every day | ORAL | 0 refills | Status: DC

## 2017-06-21 NOTE — ED Triage Notes (Signed)
Pt states she had a syncope episode on Friday while at work. Pt state since then she has felt "sleepy and tired". Denies any pain. Pt is alert and ox4. GCS-15

## 2017-06-21 NOTE — ED Provider Notes (Signed)
Emergency Department Provider Note   I have reviewed the triage vital signs and the nursing notes.   HISTORY  Chief Complaint Loss of Consciousness   HPI Lindsey Castillo is a 26 y.o. female has history of anemia, abnormal Pap smear, asthma, rhabdomyolysis was S emergency department today secondary to sleepiness. Patient states she had an episode of either syncope or falling asleep at work on Friday and was sent home she says during that time she's had persistent sleepiness. States that she usually wakes like an hour at a time. She states she slid to the night. She's been eating and drinking normally. She does endorse increased urination. She states she's on her period right now also does not think she is pregnant. Had no trauma did not hit her head. Never anything like this before. Has no known other medical problems. She was begun iron for anemia but has not been taking. No other medications. No alcohol, drugs, tobacco. No other associated symptoms such as chest pain, palpitations, nausea or vomiting, diarrhea or abdominal pain. No rashes.   Past Medical History:  Diagnosis Date  . Abnormal Pap smear    last pap 07/2012  . Asthma    USES INHALER  . Eczema   . Fracture, ankle AGE 26  . Headache(784.0)   . Infection    trich  . Infection    BV  . Infection    HX OF FREQUENT  . NSVD (normal spontaneous vaginal delivery) 01/01/2013   SVD at 1525  . Seasonal allergies     Patient Active Problem List   Diagnosis Date Noted  . Rhabdomyolysis 04/03/2017  . Anemia 04/03/2017  . Tobacco abuse 04/03/2017  . Vaginal discharge 04/03/2017  . Gonorrhea in female 09/14/2016  . Colitis, acute 09/14/2016  . SIRS (systemic inflammatory response syndrome) (HCC) 09/12/2016  . Right lower quadrant abdominal pain 09/12/2016  . NSVD (normal spontaneous vaginal delivery) 01/01/2013  . Asthma 08/02/2012  . Eczema 08/02/2012    Past Surgical History:  Procedure Laterality Date  . FOOT  SURGERY Right    "15 splinters in my foot"    Current Outpatient Rx  . Order #: [id number] Class: Historical Med  . Order #: [id number] Class: Print  . Order #: [id number] Class: Print  . Order #: [id number] Class: Print  . Order #: [id number] Class: Historical Med    Allergies Patient has no known allergies.  Family History  Problem Relation Age of Onset  . Hypertension Father   . Cancer Maternal Grandmother   . Diabetes Maternal Grandmother   . Depression Mother   . Birth defects Sister        HEART MURMUR  . Asthma Brother   . Miscarriages / [place] Cousin   . Thyroid disease Sister     Social History Social History  Substance Use Topics  . Smoking status: Current Every Day Smoker    Packs/day: 0.50    Years: 7.00    Types: Cigarettes    Last attempt to quit: 05/24/2012  . Smokeless tobacco: Never Used  . Alcohol use 0.0 oz/week     Comment: 09/13/2015 "I'll drink a couple times/week"    Review of Systems  All other systems negative except as documented in the HPI. All pertinent positives and negatives as reviewed in the HPI. ____________________________________________   PHYSICAL EXAM:  VITAL SIGNS: ED Triage Vitals [06/21/17 1256]  Enc Vitals Group     BP 129/90     Pulse Rate 82     Resp  16     Temp 98 F (36.7 C)     Temp Source Oral     SpO2 100 %     Weight      Height      Head Circumference      Peak Flow      Pain Score      Pain Loc      Pain Edu?      Excl. in GC?     Constitutional: Alert and oriented. Well appearing and in no acute distress. Eyes: Conjunctivae are normal. PERRL. EOMI. Head: Atraumatic. Nose: No congestion/rhinnorhea. Mouth/Throat: Mucous membranes are moist.  Oropharynx non-erythematous. Neck: No stridor.  No meningeal signs.   Cardiovascular: Normal rate, regular rhythm. Good peripheral circulation. Grossly normal heart sounds.   Respiratory: Normal respiratory effort.  No retractions. Lungs CTAB. Gastrointestinal:  Soft and nontender. No distention.  Musculoskeletal: No lower extremity tenderness nor edema. No gross deformities of extremities. Neurologic:  Normal speech and language. No gross focal neurologic deficits are appreciated.  Skin:  Skin is warm, dry and intact. No rash noted.   ____________________________________________   LABS (all labs ordered are listed, but only abnormal results are displayed)  Labs Reviewed  BASIC METABOLIC PANEL - Abnormal; Notable for the following:       Result Value   BUN <5 (*)    Calcium 8.7 (*)    All other components within normal limits  CBC - Abnormal; Notable for the following:    Hemoglobin 11.1 (*)    HCT 34.2 (*)    RDW 20.1 (*)    All other components within normal limits  URINALYSIS, ROUTINE W REFLEX MICROSCOPIC - Abnormal; Notable for the following:    Hgb urine dipstick MODERATE (*)    Bacteria, UA RARE (*)    All other components within normal limits  URINE CULTURE  I-STAT TROPONIN, ED  POC URINE PREG, ED   ____________________________________________  EKG   EKG Interpretation  Date/Time:  Wednesday June 21 2017 12:55:53 EDT Ventricular Rate:  83 PR Interval:  124 QRS Duration: 76 QT Interval:  378 QTC Calculation: 444 R Axis:   90 Text Interpretation:  Normal sinus rhythm Rightward axis Borderline ECG No significant change since last tracing july 2018 Confirmed by Marily Memos [id number]) on 06/21/2017 3:05:02 PM       ____________________________________________  RADIOLOGY  No results found.  ____________________________________________   PROCEDURES  Procedure(s) performed:   Procedures   ____________________________________________   INITIAL IMPRESSION / ASSESSMENT AND PLAN / ED COURSE  Pertinent labs & imaging results that were available during my care of the patient were reviewed by me and considered in my medical decision making (see chart for details).  Unsure of cause of symptoms. Does have low  hemoglobin but not requiring transfusion. Calcium also slightly low. EKG normal so I doubt emergent causes for syncope at this time. Patient does not have a primary doctor but is under work on obtaining one for follow-up to look into this further. We'll check a urine but otherwise patient will be stable for discharge.  Workup reassuring. Stable for dc w/ pcp follow up.   ____________________________________________  FINAL CLINICAL IMPRESSION(S) / ED DIAGNOSES  Final diagnoses:  Syncope and collapse  Sleepiness     MEDICATIONS GIVEN DURING THIS VISIT:  Medications - No data to display   NEW OUTPATIENT MEDICATIONS STARTED DURING THIS VISIT:  New Prescriptions   CALCIUM-VITAMIN D (OSCAL WITH D) 500-200 MG-UNIT TABLET  Take 1 tablet by mouth 2 (two) times daily.   FERROUS SULFATE 325 (65 FE) MG TABLET    Take 1 tablet (325 mg total) by mouth daily.    Note:  This document was prepared using Dragon voice recognition software and may include unintentional dictation errors.   Marily Memos, MD 06/21/17 [phone]

## 2017-06-23 LAB — URINE CULTURE

## 2018-03-16 ENCOUNTER — Encounter (HOSPITAL_COMMUNITY): Payer: Self-pay | Admitting: Emergency Medicine

## 2018-03-16 ENCOUNTER — Ambulatory Visit (HOSPITAL_COMMUNITY): Admission: EM | Admit: 2018-03-16 | Discharge: 2018-03-16 | Discharge status: Against Medical Advice | Payer: Self-pay

## 2018-03-16 NOTE — ED Triage Notes (Signed)
Pt c/o L ear pain x3 days, noticed liquid coming out of it, also c/o sore throat since this mroning.

## 2018-03-17 ENCOUNTER — Encounter (HOSPITAL_COMMUNITY): Payer: Self-pay | Admitting: Emergency Medicine

## 2018-03-17 ENCOUNTER — Ambulatory Visit (HOSPITAL_COMMUNITY)
Admission: EM | Admit: 2018-03-17 | Discharge: 2018-03-17 | Disposition: A | Discharge status: Home / Self Care | Payer: Self-pay | Attending: Family Medicine | Admitting: Family Medicine

## 2018-03-17 DIAGNOSIS — J029 Acute pharyngitis, unspecified: Secondary | ICD-10-CM

## 2018-03-17 DIAGNOSIS — H9203 Otalgia, bilateral: Secondary | ICD-10-CM

## 2018-03-17 MED ORDER — FLUTICASONE PROPIONATE 50 MCG/ACT NA SUSP
2.0000 | Freq: Every day | NASAL | 0 refills | Status: DC
Start: 2018-03-17 — End: 2018-10-02

## 2018-03-17 MED ORDER — IPRATROPIUM BROMIDE 0.06 % NA SOLN: 2.0000 | Freq: Four times a day (QID) | NASAL | 0 refills | Status: DC

## 2018-03-17 MED ORDER — AMOXICILLIN 500 MG PO CAPS: 500.0000 mg | ORAL_CAPSULE | Freq: Two times a day (BID) | ORAL | 0 refills | Status: DC

## 2018-03-17 MED ORDER — LIDOCAINE VISCOUS HCL 2 % MT SOLN: 15.0000 mL | OROMUCOSAL | 0 refills | Status: DC | PRN

## 2018-03-17 MED ORDER — CETIRIZINE HCL 10 MG PO TABS: 10.0000 mg | ORAL_TABLET | Freq: Every day | ORAL | 0 refills | Status: DC

## 2018-03-17 NOTE — Discharge Instructions (Signed)
Symptoms are most likely due to viral illness/ drainage down your throat. Start lidocaine for sore throat, do not eat or drink for the next 40 mins after use as it can stunt your gag reflex. Flonase for the next 2 weeks. Atrovent and/or Zyrtec for nasal congestion/drainage. You can use over the counter nasal saline rinse such as neti pot for nasal congestion. Monitor for any worsening of symptoms, swelling of the throat, trouble breathing, trouble swallowing, leaning forward to breath, drooling, go to the emergency department for further evaluation needed.  For sore throat/cough try using a honey-based tea. Use 3 teaspoons of honey with juice squeezed from half lemon. Place shaved pieces of ginger into 1/2-1 cup of water and warm over stove top. Then mix the ingredients and repeat every 4 hours as needed.

## 2018-03-17 NOTE — ED Triage Notes (Signed)
Pt c/o L ear pain x4 days with drainage, sore throat.

## 2018-03-17 NOTE — ED Provider Notes (Signed)
[facility]    CSN: [id number] Arrival date & time: 03/17/18  1352     History   Chief Complaint Chief Complaint  Patient presents with  . Appointment    2pm  . Sore Throat  . Otalgia    HPI Lindsey Castillo is a 27 y.o. female.   27 year old female comes in for 4-day history of ear pain and sore throat.  States started with bilateral ear pain, and few days ago started having sore throat.  She denies rhinorrhea, nasal congestion, cough.  Denies fever, chills, night sweats.  Painful swallowing without trouble breathing, trismus, tripoding, drooling.  OTC cold medication without relief.  No obvious sick contact.     Past Medical History:  Diagnosis Date  . Abnormal Pap smear    last pap 07/2012  . Asthma    USES INHALER  . Eczema   . Fracture, ankle AGE 75  . Headache(784.0)   . Infection    trich  . Infection    BV  . Infection    HX OF FREQUENT  . NSVD (normal spontaneous vaginal delivery) 01/01/2013   SVD at 1525  . Seasonal allergies     Patient Active Problem List   Diagnosis Date Noted  . Rhabdomyolysis 04/03/2017  . Anemia 04/03/2017  . Tobacco abuse 04/03/2017  . Vaginal discharge 04/03/2017  . Gonorrhea in female 09/14/2016  . Colitis, acute 09/14/2016  . SIRS (systemic inflammatory response syndrome) (HCC) 09/12/2016  . Right lower quadrant abdominal pain 09/12/2016  . NSVD (normal spontaneous vaginal delivery) 01/01/2013  . Asthma 08/02/2012  . Eczema 08/02/2012    Past Surgical History:  Procedure Laterality Date  . FOOT SURGERY Right    "15 splinters in my foot"    OB History    Gravida  1   Para  1   Term  1   Preterm      AB      Living  1     SAB      TAB      Ectopic      Multiple      Live Births  1            Home Medications    Prior to Admission medications   Medication Sig Start Date End Date Taking? Authorizing Provider  acetaminophen (TYLENOL) 500 MG tablet Take 1,000 mg by mouth every 6  (six) hours as needed for mild pain.    [provider]  amoxicillin (AMOXIL) 500 MG capsule Take 1 capsule (500 mg total) by mouth 2 (two) times daily. 03/17/18   Cathie Hoops, Amy V, PA-C  calcium-vitamin D (OSCAL WITH D) 500-200 MG-UNIT tablet Take 1 tablet by mouth 2 (two) times daily. Patient not taking: Reported on 03/16/2018 06/21/17   Mesner, Barbara Cower, MD  cetirizine (ZYRTEC) 10 MG tablet Take 1 tablet (10 mg total) by mouth daily. 03/17/18   Cathie Hoops, Amy V, PA-C  ferrous sulfate 325 (65 FE) MG tablet Take 1 tablet (325 mg total) by mouth daily. Patient not taking: Reported on 03/16/2018 06/21/17   Mesner, Barbara Cower, MD  fluticasone [facility]) 50 MCG/ACT nasal spray Place 2 sprays into both nostrils daily. 03/17/18   Cathie Hoops, Amy V, PA-C  ipratropium (ATROVENT) 0.06 % nasal spray Place 2 sprays into both nostrils 4 (four) times daily. 03/17/18   Cathie Hoops, Amy V, PA-C  lidocaine (XYLOCAINE) 2 % solution Use as directed 15 mLs in the mouth or throat as needed for mouth pain.  03/17/18   Cathie Hoops, Amy V, PA-C  metroNIDAZOLE (FLAGYL) 500 MG tablet Take 1 tablet (500 mg total) by mouth 2 (two) times daily. Patient not taking: Reported on 03/16/2018 04/08/17   Alison Murray, MD  naproxen sodium (ANAPROX) 220 MG tablet Take 220 mg by mouth daily as needed (general pain).    [provider]    Family History Family History  Problem Relation Age of Onset  . Hypertension Father   . Cancer Maternal Grandmother   . Diabetes Maternal Grandmother   . Depression Mother   . Birth defects Sister        HEART MURMUR  . Asthma Brother   . Miscarriages / [place] Cousin   . Thyroid disease Sister     Social History Social History   Tobacco Use  . Smoking status: Current Every Day Smoker    Packs/day: 0.50    Years: 7.00    Pack years: 3.50    Types: Cigarettes    Last attempt to quit: 05/24/2012    Years since quitting: 5.8  . Smokeless tobacco: Never Used  Substance Use Topics  . Alcohol use: Yes    Comment: 09/13/2015  "I'll drink a couple times/week"  . Drug use: Yes    Types: Marijuana    Comment: 09/12/2016 "LAST USE 05/16/12"     Allergies   Other   Review of Systems Review of Systems  Reason unable to perform ROS: See HPI as above.     Physical Exam Triage Vital Signs ED Triage Vitals [03/17/18 1401]  Enc Vitals Group     BP 124/88     Pulse Rate 98     Resp 18     Temp 98.1 F (36.7 C)     Temp src      SpO2 100 %     Weight      Height      Head Circumference      Peak Flow      Pain Score      Pain Loc      Pain Edu?      Excl. in GC?    No data found.  Updated Vital Signs BP 124/88   Pulse 98   Temp 98.1 F (36.7 C)   Resp 18   SpO2 100%   Physical Exam  Constitutional: She is oriented to person, place, and time. She appears well-developed and well-nourished. No distress.  HENT:  Head: Normocephalic and atraumatic.  Right Ear: Tympanic membrane, external ear and ear canal normal. Tympanic membrane is not erythematous and not bulging.  Left Ear: External ear and ear canal normal. Tympanic membrane is erythematous. Tympanic membrane is not bulging.  Nose: Nose normal. Right sinus exhibits no maxillary sinus tenderness and no frontal sinus tenderness. Left sinus exhibits no maxillary sinus tenderness and no frontal sinus tenderness.  Mouth/Throat: Uvula is midline, oropharynx is clear and moist and mucous membranes are normal.  Eyes: Pupils are equal, round, and reactive to light. Conjunctivae are normal.  Neck: Normal range of motion. Neck supple.  Cardiovascular: Normal rate, regular rhythm and normal heart sounds. Exam reveals no gallop and no friction rub.  No murmur heard. Pulmonary/Chest: Effort normal and breath sounds normal. She has no decreased breath sounds. She has no wheezes. She has no rhonchi. She has no rales.  Lymphadenopathy:    She has no cervical adenopathy.  Neurological: She is alert and oriented to person, place, and time.  Skin: Skin is  warm  and dry.  Psychiatric: She has a normal mood and affect. Her behavior is normal. Judgment normal.     UC Treatments / Results  Labs (all labs ordered are listed, but only abnormal results are displayed) Labs Reviewed - No data to display  EKG None  Radiology No results found.  Procedures Procedures (including critical care time)  Medications Ordered in UC Medications - No data to display  Initial Impression / Assessment and Plan / UC Course  I have reviewed the triage vital signs and the nursing notes.  Pertinent labs & imaging results that were available during my care of the patient were reviewed by me and considered in my medical decision making (see chart for details).    Will treat for will treat for possible otitis media with amoxicillin.  Other symptomatic treatment discussed.  Return precautions given.  Patient expresses understanding increased pain.  Final Clinical Impressions(s) / UC Diagnoses   Final diagnoses:  Otalgia of both ears  Pharyngitis, unspecified etiology    ED Prescriptions    Medication Sig Dispense Auth. Provider   amoxicillin (AMOXIL) 500 MG capsule Take 1 capsule (500 mg total) by mouth 2 (two) times daily. 20 capsule Yu, Amy V, PA-C   fluticasone (FLONASE) 50 MCG/ACT nasal spray Place 2 sprays into both nostrils daily. 1 g Yu, Amy V, PA-C   ipratropium (ATROVENT) 0.06 % nasal spray Place 2 sprays into both nostrils 4 (four) times daily. 15 mL Yu, Amy V, PA-C   cetirizine (ZYRTEC) 10 MG tablet Take 1 tablet (10 mg total) by mouth daily. 15 tablet Yu, Amy V, PA-C   lidocaine (XYLOCAINE) 2 % solution Use as directed 15 mLs in the mouth or throat as needed for mouth pain. 100 mL Threasa AlphaYu, Amy V, PA-C       Yu, Amy V, [place]PA-C 03/17/18 [phone]

## 2018-03-17 NOTE — ED Notes (Signed)
Bed: UC09 Expected date:  Expected time:  Means of arrival:  Comments: Hold for 2pm appt

## 2018-07-03 ENCOUNTER — Other Ambulatory Visit: Payer: Self-pay

## 2018-07-03 ENCOUNTER — Ambulatory Visit (HOSPITAL_COMMUNITY)
Admission: EM | Admit: 2018-07-03 | Discharge: 2018-07-03 | Disposition: A | Discharge status: Home / Self Care | Payer: Self-pay | Attending: Family Medicine | Admitting: Family Medicine

## 2018-07-03 ENCOUNTER — Encounter (HOSPITAL_COMMUNITY): Payer: Self-pay | Admitting: *Deleted

## 2018-07-03 DIAGNOSIS — Z79899 Other long term (current) drug therapy: Secondary | ICD-10-CM | POA: Insufficient documentation

## 2018-07-03 DIAGNOSIS — N898 Other specified noninflammatory disorders of vagina: Secondary | ICD-10-CM

## 2018-07-03 DIAGNOSIS — F17210 Nicotine dependence, cigarettes, uncomplicated: Secondary | ICD-10-CM | POA: Insufficient documentation

## 2018-07-03 DIAGNOSIS — R1032 Left lower quadrant pain: Secondary | ICD-10-CM | POA: Insufficient documentation

## 2018-07-03 DIAGNOSIS — Z3202 Encounter for pregnancy test, result negative: Secondary | ICD-10-CM

## 2018-07-03 LAB — POCT PREGNANCY, URINE: Preg Test, Ur: NEGATIVE

## 2018-07-03 MED ORDER — METRONIDAZOLE 500 MG PO TABS: 500.0000 mg | ORAL_TABLET | Freq: Two times a day (BID) | ORAL | 0 refills | Status: DC

## 2018-07-03 MED ORDER — FLUCONAZOLE 150 MG PO TABS: 150.0000 mg | ORAL_TABLET | Freq: Every day | ORAL | 0 refills | Status: DC

## 2018-07-03 NOTE — ED Provider Notes (Signed)
[facility]    CSN: [id number] Arrival date & time: 07/03/18  1315     History   Chief Complaint Chief Complaint  Patient presents with  . Vaginal Discharge    HPI Lindsey Castillo is a 27 y.o. female.   27 year old female comes in for 1 week history of vaginal discharge with itching.  States discharge has an odor to it that is fishy in nature.  She denies vaginal bleeding.  Denies urinary symptoms such as frequency, dysuria, hematuria.  Denies fever, chills, night sweats.  States has left lower quadrant pain that is intermittent, 1 out of 10 pain, which improved greatly after bowel movement this a.m.  She is sexually active with one female partner, occasional condom use.  LMP 05/25/2018.     Past Medical History:  Diagnosis Date  . Abnormal Pap smear    last pap 07/2012  . Asthma    USES INHALER  . Eczema   . Fracture, ankle AGE 73  . Headache(784.0)   . Infection    trich  . Infection    BV  . Infection    HX OF FREQUENT  . NSVD (normal spontaneous vaginal delivery) 01/01/2013   SVD at 1525  . Seasonal allergies     Patient Active Problem List   Diagnosis Date Noted  . Rhabdomyolysis 04/03/2017  . Anemia 04/03/2017  . Tobacco abuse 04/03/2017  . Vaginal discharge 04/03/2017  . Gonorrhea in female 09/14/2016  . Colitis, acute 09/14/2016  . SIRS (systemic inflammatory response syndrome) (HCC) 09/12/2016  . Right lower quadrant abdominal pain 09/12/2016  . NSVD (normal spontaneous vaginal delivery) 01/01/2013  . Asthma 08/02/2012  . Eczema 08/02/2012    Past Surgical History:  Procedure Laterality Date  . FOOT SURGERY Right    "15 splinters in my foot"    OB History    Gravida  1   Para  1   Term  1   Preterm      AB      Living  1     SAB      TAB      Ectopic      Multiple      Live Births  1            Home Medications    Prior to Admission medications   Medication Sig Start Date End Date Taking? Authorizing  Provider  acetaminophen (TYLENOL) 500 MG tablet Take 1,000 mg by mouth every 6 (six) hours as needed for mild pain.    [provider]  amoxicillin (AMOXIL) 500 MG capsule Take 1 capsule (500 mg total) by mouth 2 (two) times daily. 03/17/18   Cathie Hoops, Kathan Kirker V, PA-C  calcium-vitamin D (OSCAL WITH D) 500-200 MG-UNIT tablet Take 1 tablet by mouth 2 (two) times daily. Patient not taking: Reported on 03/16/2018 06/21/17   Mesner, Barbara Cower, MD  cetirizine (ZYRTEC) 10 MG tablet Take 1 tablet (10 mg total) by mouth daily. 03/17/18   Cathie Hoops, Coriann Brouhard V, PA-C  ferrous sulfate 325 (65 FE) MG tablet Take 1 tablet (325 mg total) by mouth daily. Patient not taking: Reported on 03/16/2018 06/21/17   Mesner, Barbara Cower, MD  fluconazole (DIFLUCAN) 150 MG tablet Take 1 tablet (150 mg total) by mouth daily. Take second dose 72 hours later if symptoms still persists. 07/03/18   Cathie Hoops, Veleka Djordjevic V, PA-C  fluticasone (FLONASE) 50 MCG/ACT nasal spray Place 2 sprays into both nostrils daily. 03/17/18   Belinda Fisher, PA-C  ipratropium (ATROVENT) 0.06 % nasal spray Place 2 sprays into both nostrils 4 (four) times daily. 03/17/18   Cathie Hoops, Kenden Brandt V, PA-C  lidocaine (XYLOCAINE) 2 % solution Use as directed 15 mLs in the mouth or throat as needed for mouth pain. 03/17/18   Cathie Hoops, Beuford Garcilazo V, PA-C  metroNIDAZOLE (FLAGYL) 500 MG tablet Take 1 tablet (500 mg total) by mouth 2 (two) times daily. 07/03/18   Cathie Hoops, Davione Lenker V, PA-C  naproxen sodium (ANAPROX) 220 MG tablet Take 220 mg by mouth daily as needed (general pain).    [provider]    Family History Family History  Problem Relation Age of Onset  . Hypertension Father   . Cancer Maternal Grandmother   . Diabetes Maternal Grandmother   . Depression Mother   . Birth defects Sister        HEART MURMUR  . Asthma Brother   . Miscarriages / [place] Cousin   . Thyroid disease Sister     Social History Social History   Tobacco Use  . Smoking status: Current Every Day Smoker    Packs/day: 0.50    Years: 7.00     Pack years: 3.50    Types: Cigarettes    Last attempt to quit: 05/24/2012    Years since quitting: 6.1  . Smokeless tobacco: Never Used  Substance Use Topics  . Alcohol use: Yes    Comment: 09/13/2015 "I'll drink a couple times/week"  . Drug use: Not Currently    Types: Marijuana    Comment: 09/12/2016 "LAST USE 05/16/12"     Allergies   Other   Review of Systems Review of Systems  Reason unable to perform ROS: See HPI as above.     Physical Exam Triage Vital Signs ED Triage Vitals  Enc Vitals Group     BP 07/03/18 1337 109/76     Pulse Rate 07/03/18 1337 80     Resp 07/03/18 1337 16     Temp 07/03/18 1337 98.5 F (36.9 C)     Temp Source 07/03/18 1337 Oral     SpO2 07/03/18 1337 100 %     Weight --      Height --      Head Circumference --      Peak Flow --      Pain Score 07/03/18 1338 2     Pain Loc --      Pain Edu? --      Excl. in GC? --    No data found.  Updated Vital Signs BP 109/76 (BP Location: Left Arm)   Pulse 80   Temp 98.5 F (36.9 C) (Oral)   Resp 16   LMP 05/25/2018 (Exact Date)   SpO2 100%   Physical Exam  Constitutional: She is oriented to person, place, and time. She appears well-developed and well-nourished. No distress.  HENT:  Head: Normocephalic and atraumatic.  Eyes: Pupils are equal, round, and reactive to light. Conjunctivae are normal.  Cardiovascular: Normal rate, regular rhythm and normal heart sounds. Exam reveals no gallop and no friction rub.  No murmur heard. Pulmonary/Chest: Effort normal and breath sounds normal. No stridor. No respiratory distress. She has no wheezes. She has no rales.  Abdominal: Soft. Bowel sounds are normal. She exhibits no mass. There is no tenderness. There is no rebound, no guarding and no CVA tenderness.  Neurological: She is alert and oriented to person, place, and time.  Skin: Skin is warm and dry.  Psychiatric: She has a normal  mood and affect. Her behavior is normal. Judgment normal.     UC  Treatments / Results  Labs (all labs ordered are listed, but only abnormal results are displayed) Labs Reviewed  POCT PREGNANCY, URINE  CERVICOVAGINAL ANCILLARY ONLY    EKG None  Radiology No results found.  Procedures Procedures (including critical care time)  Medications Ordered in UC Medications - No data to display  Initial Impression / Assessment and Plan / UC Course  I have reviewed the triage vital signs and the nursing notes.  Pertinent labs & imaging results that were available during my care of the patient were reviewed by me and considered in my medical decision making (see chart for details).    Patient was treated empirically for BV and yeast. Diflucan and flagyl as directed. Cytology sent, patient will be contacted with any positive results that require additional treatment. Patient to refrain from sexual activity for the next 7 days. Return precautions given.   Final Clinical Impressions(s) / UC Diagnoses   Final diagnoses:  Vaginal discharge   ED Prescriptions    Medication Sig Dispense Auth. Provider   metroNIDAZOLE (FLAGYL) 500 MG tablet Take 1 tablet (500 mg total) by mouth 2 (two) times daily. 14 tablet Simone Tuckey V, PA-C   fluconazole (DIFLUCAN) 150 MG tablet Take 1 tablet (150 mg total) by mouth daily. Take second dose 72 hours later if symptoms still persists. 2 tablet Threasa Alpha, PA-C 07/03/18 1448

## 2018-07-03 NOTE — ED Triage Notes (Signed)
C/o vaginal discharge with itching onset 1 week ago.

## 2018-07-03 NOTE — Discharge Instructions (Signed)
You were treated empirically for yeast and bacterial vaginitis. Start diflucan and flagyl as directed. Cytology sent, you will be contacted with any positive results that requires further treatment. Refrain from sexual activity and alcohol use for the next 7 days. Monitor for any worsening of symptoms, fever, worsening abdominal pain, nausea, vomiting, to follow up for reevaluation.  Your left lower abdominal pain most likely is due to bowel movements. Please ensure your bowel movement is soft. Can take over the counter miralax to help with bowel movement as needed.

## 2018-07-04 LAB — CERVICOVAGINAL ANCILLARY ONLY
CHLAMYDIA, DNA PROBE: NEGATIVE
NEISSERIA GONORRHEA: NEGATIVE
TRICH (WINDOWPATH): POSITIVE — AB

## 2018-07-05 ENCOUNTER — Telehealth: Payer: Self-pay | Admitting: Emergency Medicine

## 2018-07-05 NOTE — Telephone Encounter (Signed)
Pt advised, she voiced understanding

## 2018-07-05 NOTE — Telephone Encounter (Signed)
Trichomonas is positive. Rx metronidazole was given at the urgent care visit. Pt needs education to please refrain from sexual intercourse for 7 days to give the medicine time to work. Sexual partners need to be notified and tested/treated. Condoms may reduce risk of reinfection. Recheck for further evaluation if symptoms are not improving. Called Patient, no answer, Left generic message to return call.

## 2018-07-05 NOTE — Telephone Encounter (Signed)
Patient returned call, results and instructions provided.  Patient verbalized understanding.

## 2018-07-30 ENCOUNTER — Telehealth (HOSPITAL_COMMUNITY): Payer: Self-pay

## 2018-07-30 MED ORDER — FLUCONAZOLE 150 MG PO TABS: 150.0000 mg | ORAL_TABLET | Freq: Every day | ORAL | 0 refills | Status: AC

## 2018-07-30 MED ORDER — METRONIDAZOLE 500 MG PO TABS: 2000.0000 mg | ORAL_TABLET | Freq: Once | ORAL | 0 refills | Status: AC
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
## 2018-10-02 ENCOUNTER — Encounter (HOSPITAL_COMMUNITY): Payer: Self-pay

## 2018-10-02 ENCOUNTER — Ambulatory Visit (HOSPITAL_COMMUNITY)
Admission: EM | Admit: 2018-10-02 | Discharge: 2018-10-02 | Disposition: A | Discharge status: Home / Self Care | Payer: Self-pay | Attending: Family Medicine | Admitting: Family Medicine

## 2018-10-02 DIAGNOSIS — R062 Wheezing: Secondary | ICD-10-CM

## 2018-10-02 DIAGNOSIS — J111 Influenza due to unidentified influenza virus with other respiratory manifestations: Secondary | ICD-10-CM | POA: Insufficient documentation

## 2018-10-02 DIAGNOSIS — R6883 Chills (without fever): Secondary | ICD-10-CM

## 2018-10-02 DIAGNOSIS — Z720 Tobacco use: Secondary | ICD-10-CM

## 2018-10-02 DIAGNOSIS — R05 Cough: Secondary | ICD-10-CM

## 2018-10-02 DIAGNOSIS — R52 Pain, unspecified: Secondary | ICD-10-CM

## 2018-10-02 MED ORDER — ALBUTEROL SULFATE HFA 108 (90 BASE) MCG/ACT IN AERS: 2.0000 | INHALATION_SPRAY | RESPIRATORY_TRACT | 11 refills | Status: DC | PRN

## 2018-10-02 MED ORDER — PREDNISONE 50 MG PO TABS: ORAL_TABLET | ORAL | 0 refills | Status: DC

## 2018-10-02 MED ORDER — OSELTAMIVIR PHOSPHATE 75 MG PO CAPS: 75.0000 mg | ORAL_CAPSULE | Freq: Two times a day (BID) | ORAL | 0 refills | Status: DC
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
## 2018-10-02 NOTE — ED Triage Notes (Signed)
Pt c/o productive cough with yellow sputum, chills, bodyaches, sneezing since yesterday

## 2018-11-02 ENCOUNTER — Encounter (HOSPITAL_COMMUNITY): Payer: Self-pay | Admitting: Emergency Medicine

## 2018-11-02 ENCOUNTER — Ambulatory Visit (HOSPITAL_COMMUNITY)
Admission: EM | Admit: 2018-11-02 | Discharge: 2018-11-02 | Disposition: A | Discharge status: Home / Self Care | Payer: Medicaid Other | Attending: Family Medicine | Admitting: Family Medicine

## 2018-11-02 DIAGNOSIS — Z3202 Encounter for pregnancy test, result negative: Secondary | ICD-10-CM

## 2018-11-02 DIAGNOSIS — Z202 Contact with and (suspected) exposure to infections with a predominantly sexual mode of transmission: Secondary | ICD-10-CM | POA: Insufficient documentation

## 2018-11-02 DIAGNOSIS — N3000 Acute cystitis without hematuria: Secondary | ICD-10-CM

## 2018-11-02 LAB — POCT URINALYSIS DIP (DEVICE)
BILIRUBIN URINE: NEGATIVE
Glucose, UA: NEGATIVE mg/dL
Hgb urine dipstick: NEGATIVE
KETONES UR: NEGATIVE mg/dL
Nitrite: POSITIVE — AB
PH: 7 (ref 5.0–8.0)
Protein, ur: NEGATIVE mg/dL
Specific Gravity, Urine: 1.015 (ref 1.005–1.030)
Urobilinogen, UA: 0.2 mg/dL (ref 0.0–1.0)

## 2018-11-02 LAB — POCT PREGNANCY, URINE: PREG TEST UR: NEGATIVE

## 2018-11-02 MED ORDER — CEPHALEXIN 500 MG PO CAPS: 500.0000 mg | ORAL_CAPSULE | Freq: Two times a day (BID) | ORAL | 0 refills | Status: DC

## 2018-11-02 NOTE — ED Triage Notes (Signed)
PT presents to [facility] For assessment of dysuria x 2 weeks.  States in the past few days she has had urgency, and last night she was incontinent of urine in her sleep.   States she tried AZO without relief.

## 2018-11-02 NOTE — ED Notes (Signed)
Patient able to ambulate independently  

## 2018-11-02 NOTE — ED Provider Notes (Signed)
[facility]    CSN: [id number] Arrival date & time: 11/02/18  6767     History   Chief Complaint Chief Complaint  Patient presents with  . Urinary Frequency    HPI Lindsey Castillo is a 28 y.o. female.   HPI  Patient states she has urinary frequency and dysuria for about a week.  She tried an Azo product which did not help.  She states that she has had some incontinence.  She has some lower abdominal crampy pain.  No flank pain.  No nausea or vomiting.  No fever or chills.  No history of kidney stones or kidney infections. She also states she would like to be tested for STDs.  She has a slight vaginal discharge.  She states that she just broke up with a boyfriend who she found out was not monogamous.  She does not know of any specific infection she would like to be treated for, she just wants general testing.  Past Medical History:  Diagnosis Date  . Abnormal Pap smear    last pap 07/2012  . Asthma    USES INHALER  . Eczema   . Fracture, ankle AGE 29  . Headache(784.0)   . Infection    trich  . Infection    BV  . Infection    HX OF FREQUENT  . NSVD (normal spontaneous vaginal delivery) 01/01/2013   SVD at 1525  . Seasonal allergies     Patient Active Problem List   Diagnosis Date Noted  . Rhabdomyolysis 04/03/2017  . Anemia 04/03/2017  . Tobacco abuse 04/03/2017  . Vaginal discharge 04/03/2017  . Gonorrhea in female 09/14/2016  . Colitis, acute 09/14/2016  . SIRS (systemic inflammatory response syndrome) (HCC) 09/12/2016  . Right lower quadrant abdominal pain 09/12/2016  . NSVD (normal spontaneous vaginal delivery) 01/01/2013  . Asthma 08/02/2012  . Eczema 08/02/2012    Past Surgical History:  Procedure Laterality Date  . FOOT SURGERY Right    "15 splinters in my foot"    OB History    Gravida  1   Para  1   Term  1   Preterm      AB      Living  1     SAB      TAB      Ectopic      Multiple      Live Births  1             Home Medications    Prior to Admission medications   Medication Sig Start Date End Date Taking? Authorizing Provider  acetaminophen (TYLENOL) 500 MG tablet Take 1,000 mg by mouth every 6 (six) hours as needed for mild pain.    [provider]  albuterol (PROVENTIL HFA;VENTOLIN HFA) 108 (90 Base) MCG/ACT inhaler Inhale 2 puffs into the lungs every 4 (four) hours as needed for wheezing or shortness of breath (cough, shortness of breath or wheezing.). 10/02/18   Elvina Sidle, MD  cephALEXin (KEFLEX) 500 MG capsule Take 1 capsule (500 mg total) by mouth 2 (two) times daily. 11/02/18   Eustace Moore, MD    Family History Family History  Problem Relation Age of Onset  . Hypertension Father   . Cancer Maternal Grandmother   . Diabetes Maternal Grandmother   . Depression Mother   . Birth defects Sister        HEART MURMUR  . Asthma Brother   . Miscarriages / [place] Cousin   .  Thyroid disease Sister     Social History Social History   Tobacco Use  . Smoking status: Current Every Day Smoker    Packs/day: 0.50    Years: 7.00    Pack years: 3.50    Types: Cigarettes    Last attempt to quit: 05/24/2012    Years since quitting: 6.4  . Smokeless tobacco: Never Used  Substance Use Topics  . Alcohol use: Yes    Comment: 09/13/2015 "I'll drink a couple times/week"  . Drug use: Not Currently    Types: Marijuana    Comment: 09/12/2016 "LAST USE 05/16/12"     Allergies   Other   Review of Systems Review of Systems  Constitutional: Negative for chills and fever.  HENT: Negative for ear pain and sore throat.   Eyes: Negative for pain and visual disturbance.  Respiratory: Negative for cough and shortness of breath.   Cardiovascular: Negative for chest pain and palpitations.  Gastrointestinal: Negative for abdominal pain, nausea and vomiting.  Genitourinary: Positive for dysuria, frequency and vaginal discharge. Negative for hematuria.  Musculoskeletal: Negative  for arthralgias and back pain.  Skin: Negative for color change and rash.  Neurological: Negative for seizures and syncope.  All other systems reviewed and are negative.    Physical Exam Triage Vital Signs ED Triage Vitals  Enc Vitals Group     BP 11/02/18 0918 118/79     Pulse Rate 11/02/18 0918 92     Resp 11/02/18 0918 16     Temp 11/02/18 0918 98 F (36.7 C)     Temp Source 11/02/18 0918 Oral     SpO2 11/02/18 0918 100 %     Weight --      Height --      Head Circumference --      Peak Flow --      Pain Score 11/02/18 0919 6     Pain Loc --      Pain Edu? --      Excl. in GC? --    No data found.  Updated Vital Signs BP 118/79 (BP Location: Left Arm)   Pulse 92   Temp 98 F (36.7 C) (Oral)   Resp 16   SpO2 100%   Visual Acuity Right Eye Distance:   Left Eye Distance:   Bilateral Distance:    Right Eye Near:   Left Eye Near:    Bilateral Near:     Physical Exam Constitutional:      General: She is not in acute distress.    Appearance: She is well-developed and normal weight. She is not ill-appearing or toxic-appearing.  HENT:     Head: Normocephalic and atraumatic.     Right Ear: Tympanic membrane and ear canal normal.     Left Ear: Tympanic membrane and ear canal normal.     Nose: Nose normal.     Mouth/Throat:     Mouth: Mucous membranes are moist.  Eyes:     Conjunctiva/sclera: Conjunctivae normal.     Pupils: Pupils are equal, round, and reactive to light.  Neck:     Musculoskeletal: Normal range of motion.  Cardiovascular:     Rate and Rhythm: Normal rate and regular rhythm.     Heart sounds: Normal heart sounds.  Pulmonary:     Effort: Pulmonary effort is normal. No respiratory distress.     Breath sounds: Normal breath sounds.  Abdominal:     General: There is no distension.     Palpations:  Abdomen is soft.     Comments: Abdomen is soft and nontender.  No CVAT  Musculoskeletal: Normal range of motion.  Skin:    General: Skin is warm  and dry.  Neurological:     Mental Status: She is alert.      UC Treatments / Results  Labs (all labs ordered are listed, but only abnormal results are displayed) Labs Reviewed  POCT URINALYSIS DIP (DEVICE) - Abnormal; Notable for the following components:      Result Value   Nitrite POSITIVE (*)    Leukocytes,Ua MODERATE (*)    All other components within normal limits  URINE CULTURE  HIV ANTIBODY (ROUTINE TESTING W REFLEX)  RPR  POC URINE PREG, ED  POCT PREGNANCY, URINE  CERVICOVAGINAL ANCILLARY ONLY    EKG None  Radiology No results found.  Procedures Procedures (including critical care time)  Medications Ordered in UC Medications - No data to display  Initial Impression / Assessment and Plan / UC Course  I have reviewed the triage vital signs and the nursing notes.  Pertinent labs & imaging results that were available during my care of the patient were reviewed by me and considered in my medical decision making (see chart for details).    Final Clinical Impressions(s) / UC Diagnoses   Final diagnoses:  Acute cystitis without hematuria  Possible exposure to STD     Discharge Instructions     Drink plenty of fluids Take antibiotic 2 times a day for 5 days We will call you if any of your test results are positive We did lab testing during this visit.  If there are any abnormal findings that require change in medicine or indicate a positive result, you will be notified.  If all of your tests are normal, you will not be called.      ED Prescriptions    Medication Sig Dispense Auth. Provider   cephALEXin (KEFLEX) 500 MG capsule Take 1 capsule (500 mg total) by mouth 2 (two) times daily. 10 capsule Eustace Moore, MD     Controlled Substance Prescriptions [place] Controlled Substance Registry consulted? Not Applicable   Eustace Moore, MD 11/02/18 [phone]

## 2018-11-02 NOTE — Discharge Instructions (Signed)
Drink plenty of fluids Take antibiotic 2 times a day for 5 days We will call you if any of your test results are positive We did lab testing during this visit.  If there are any abnormal findings that require change in medicine or indicate a positive result, you will be notified.  If all of your tests are normal, you will not be called.

## 2018-11-02 NOTE — ED Notes (Signed)
Pt provided with water to assist with urine sample.

## 2018-11-03 LAB — HIV ANTIBODY (ROUTINE TESTING W REFLEX): HIV Screen 4th Generation wRfx: NONREACTIVE

## 2018-11-03 LAB — RPR: RPR: NONREACTIVE

## 2018-11-04 LAB — URINE CULTURE: Culture: >=100000 — AB

## 2018-11-05 ENCOUNTER — Telehealth (HOSPITAL_COMMUNITY): Payer: Self-pay | Admitting: Emergency Medicine

## 2018-11-05 LAB — CERVICOVAGINAL ANCILLARY ONLY
Chlamydia: NEGATIVE
Neisseria Gonorrhea: NEGATIVE
Trichomonas: POSITIVE — AB

## 2018-11-05 NOTE — Telephone Encounter (Signed)
Urine culture was positive for Escherichia coli and was given keflex at urgent care visit. Pt contacted and made aware, educated on completing antibiotic and to follow up if symptoms are persistent. Verbalized understanding.    

## 2018-11-06 ENCOUNTER — Telehealth (HOSPITAL_COMMUNITY): Payer: Self-pay | Admitting: Emergency Medicine

## 2018-11-06 MED ORDER — METRONIDAZOLE 500 MG PO TABS: 2000.0000 mg | ORAL_TABLET | Freq: Once | ORAL | 0 refills | Status: AC

## 2018-11-06 NOTE — Telephone Encounter (Signed)
Trichomonas is positive. Rx  for Flagyl 2 grams, once was sent to the pharmacy of record. Pt needs education to refrain from sexual intercourse for 7 days to give the medicine time to work. Sexual partners need to be notified and tested/treated. Condoms may reduce risk of reinfection. Recheck for further evaluation if symptoms are not improving.   Patient contacted and made aware of all results, all questions answered.   

## 2018-11-23 ENCOUNTER — Other Ambulatory Visit: Payer: Self-pay

## 2018-11-23 ENCOUNTER — Emergency Department (HOSPITAL_COMMUNITY)
Admission: EM | Admit: 2018-11-23 | Discharge: 2018-11-23 | Disposition: A | Discharge status: Home / Self Care | Payer: Medicaid Other | Attending: Emergency Medicine | Admitting: Emergency Medicine

## 2018-11-23 ENCOUNTER — Encounter (HOSPITAL_COMMUNITY): Payer: Self-pay | Admitting: Emergency Medicine

## 2018-11-23 DIAGNOSIS — F17210 Nicotine dependence, cigarettes, uncomplicated: Secondary | ICD-10-CM | POA: Insufficient documentation

## 2018-11-23 DIAGNOSIS — J209 Acute bronchitis, unspecified: Secondary | ICD-10-CM | POA: Insufficient documentation

## 2018-11-23 LAB — GROUP A STREP BY PCR: Group A Strep by PCR: NOT DETECTED

## 2018-11-23 LAB — INFLUENZA PANEL BY PCR (TYPE A & B)
Influenza A By PCR: NEGATIVE
Influenza B By PCR: NEGATIVE

## 2018-11-23 MED ORDER — IPRATROPIUM-ALBUTEROL 0.5-2.5 (3) MG/3ML IN SOLN
3.0000 mL | Freq: Once | RESPIRATORY_TRACT | Status: AC
  Administered 2018-11-23: 3 mL via RESPIRATORY_TRACT
  Filled 2018-11-23: qty 3

## 2018-11-23 MED ORDER — AZITHROMYCIN 250 MG PO TABS: ORAL_TABLET | ORAL | 0 refills | Status: DC

## 2018-11-23 MED ORDER — BENZONATATE 100 MG PO CAPS: 100.0000 mg | ORAL_CAPSULE | Freq: Three times a day (TID) | ORAL | 0 refills | Status: DC

## 2018-11-23 MED ORDER — PREDNISONE 10 MG PO TABS: 40.0000 mg | ORAL_TABLET | Freq: Every day | ORAL | 0 refills | Status: DC

## 2018-11-23 NOTE — ED Notes (Signed)
Discharge instructions and prescriptions discussed with Pt. Pt verbalized understanding. Pt stable and ambulatory.   

## 2018-11-23 NOTE — Discharge Instructions (Addendum)
Take tylenol and ibuprofen as needed for fever or pain. Follow up with your doctor. Return here as needed for worsening symptoms.

## 2018-11-23 NOTE — ED Triage Notes (Signed)
Pt reports coughing and sore throat that started yesterday, reports body aches today, denies fevers.

## 2018-11-23 NOTE — ED Provider Notes (Signed)
[facility] EMERGENCY DEPARTMENT Provider Note   CSN: [id number] Arrival date & time: 11/23/18  1439    History   Chief Complaint Chief Complaint  Patient presents with  . Cough  . Generalized Body Aches  . Sore Throat    HPI Lindsey Castillo is a 28 y.o. female with hx of asthma and every day smoker who presents to the ED with c/o cough and sore throat that started yesterday. Patient c/o body aches today but no fever. Patient reports being around her son who has had similar symptoms.      The history is provided by the patient. No language interpreter was used.  Cough  Cough characteristics:  Dry Severity:  Moderate Onset quality:  Gradual Duration:  2 days Progression:  Worsening Chronicity:  New Smoker: yes   Relieved by:  Nothing Worsened by:  Smoking Ineffective treatments:  None tried Associated symptoms: chills, headaches, myalgias, sinus congestion, sore throat and wheezing   Associated symptoms: no eye discharge and no fever  Rash: eczema.   Risk factors: no recent travel   Sore Throat  This is a new problem. The current episode started 2 days ago. The problem occurs constantly. The problem has been gradually worsening. Associated symptoms include headaches. Pertinent negatives include no abdominal pain. The symptoms are aggravated by eating and swallowing. Nothing relieves the symptoms. She has tried nothing for the symptoms.    Past Medical History:  Diagnosis Date  . Abnormal Pap smear    last pap 07/2012  . Asthma    USES INHALER  . Eczema   . Fracture, ankle AGE 34  . Headache(784.0)   . Infection    trich  . Infection    BV  . Infection    HX OF FREQUENT  . NSVD (normal spontaneous vaginal delivery) 01/01/2013   SVD at 1525  . Seasonal allergies     Patient Active Problem List   Diagnosis Date Noted  . Rhabdomyolysis 04/03/2017  . Anemia 04/03/2017  . Tobacco abuse 04/03/2017  . Vaginal discharge 04/03/2017  . Gonorrhea  in female 09/14/2016  . Colitis, acute 09/14/2016  . SIRS (systemic inflammatory response syndrome) (HCC) 09/12/2016  . Right lower quadrant abdominal pain 09/12/2016  . NSVD (normal spontaneous vaginal delivery) 01/01/2013  . Asthma 08/02/2012  . Eczema 08/02/2012    Past Surgical History:  Procedure Laterality Date  . FOOT SURGERY Right    "15 splinters in my foot"     OB History    Gravida  1   Para  1   Term  1   Preterm      AB      Living  1     SAB      TAB      Ectopic      Multiple      Live Births  1            Home Medications    Prior to Admission medications   Medication Sig Start Date End Date Taking? Authorizing Provider  acetaminophen (TYLENOL) 500 MG tablet Take 1,000 mg by mouth every 6 (six) hours as needed for mild pain.    [provider]  albuterol (PROVENTIL HFA;VENTOLIN HFA) 108 (90 Base) MCG/ACT inhaler Inhale 2 puffs into the lungs every 4 (four) hours as needed for wheezing or shortness of breath (cough, shortness of breath or wheezing.). 10/02/18   Elvina Sidle, MD  azithromycin (ZITHROMAX Z-PAK) 250 MG tablet Take  2 tablets now and then one tablet PO daily 11/23/18   Kerrie Buffalo M, NP  benzonatate (TESSALON) 100 MG capsule Take 1 capsule (100 mg total) by mouth every 8 (eight) hours. 11/23/18   Janne Napoleon, NP  predniSONE (DELTASONE) 10 MG tablet Take 4 tablets (40 mg total) by mouth daily with breakfast. 11/23/18   Janne Napoleon, NP    Family History Family History  Problem Relation Age of Onset  . Hypertension Father   . Cancer Maternal Grandmother   . Diabetes Maternal Grandmother   . Depression Mother   . Birth defects Sister        HEART MURMUR  . Asthma Brother   . Miscarriages / [place] Cousin   . Thyroid disease Sister     Social History Social History   Tobacco Use  . Smoking status: Current Every Day Smoker    Packs/day: 0.50    Years: 7.00    Pack years: 3.50    Types: Cigarettes     Last attempt to quit: 05/24/2012    Years since quitting: 6.5  . Smokeless tobacco: Never Used  Substance Use Topics  . Alcohol use: Yes    Comment: 09/13/2015 "I'll drink a couple times/week"  . Drug use: Not Currently    Types: Marijuana    Comment: 09/12/2016 "LAST USE 05/16/12"     Allergies   Other   Review of Systems Review of Systems  Constitutional: Positive for chills. Negative for fever.  HENT: Positive for congestion and sore throat.   Eyes: Negative for discharge and redness.  Respiratory: Positive for cough and wheezing.   Gastrointestinal: Negative for abdominal pain, diarrhea, nausea and vomiting.  Genitourinary: Negative for dysuria, frequency and urgency.  Musculoskeletal: Positive for myalgias.  Skin: Rash: eczema.  Neurological: Positive for headaches. Negative for syncope.  Hematological: Positive for adenopathy.  Psychiatric/Behavioral: Negative for confusion.     Physical Exam Updated Vital Signs BP 110/73   Pulse 76   Temp 97.9 F (36.6 C) (Oral)   Resp 16   LMP 11/20/2018   SpO2 99%   Physical Exam Vitals signs and nursing note reviewed.  Constitutional:      General: She is not in acute distress.    Appearance: She is well-developed.  HENT:     Head: Normocephalic.     Right Ear: Tympanic membrane normal.     Left Ear: Tympanic membrane normal.     Nose: Congestion present.     Mouth/Throat:     Mouth: Mucous membranes are moist.     Pharynx: Posterior oropharyngeal erythema present.  Eyes:     Extraocular Movements: Extraocular movements intact.     Conjunctiva/sclera: Conjunctivae normal.  Neck:     Musculoskeletal: Normal range of motion and neck supple. No neck rigidity.  Cardiovascular:     Rate and Rhythm: Normal rate and regular rhythm.  Pulmonary:     Effort: Pulmonary effort is normal. No respiratory distress.     Breath sounds: Decreased air movement present. No rales. Wheezes: occasional.  Abdominal:     Palpations:  Abdomen is soft.     Tenderness: There is no abdominal tenderness.  Musculoskeletal: Normal range of motion.  Lymphadenopathy:     Cervical: Cervical adenopathy present.  Skin:    General: Skin is warm and dry.  Neurological:     General: No focal deficit present.     Mental Status: She is alert and oriented to person, place, and time.  Psychiatric:        Mood and Affect: Mood normal.      ED Treatments / Results  Labs (all labs ordered are listed, but only abnormal results are displayed) Labs Reviewed  GROUP A STREP BY PCR  INFLUENZA PANEL BY PCR (TYPE A & B)   Radiology No results found.  Procedures Procedures (including critical care time)  Medications Ordered in ED Medications  ipratropium-albuterol (DUONEB) 0.5-2.5 (3) MG/3ML nebulizer solution 3 mL (3 mLs Nebulization Given 11/23/18 1539)     Initial Impression / Assessment and Plan / ED Course  I have reviewed the triage vital signs and the nursing notes. 28 y.o. female here with dry cough, congestion, fever, chills stable for d/c after improvement with neb treatment and influenza screen negative. Since patient is an every day smoker will treat for bronchitis with zithromax and short steroid burst and she will use her inhaler as needed. Patient to f/u with PCP or return to the ED if symptoms worsen.   Final Clinical Impressions(s) / ED Diagnoses   Final diagnoses:  Acute bronchitis, unspecified organism    ED Discharge Orders         Ordered    benzonatate (TESSALON) 100 MG capsule  Every 8 hours     11/23/18 1639    predniSONE (DELTASONE) 10 MG tablet  Daily with breakfast     11/23/18 1639    azithromycin (ZITHROMAX Z-PAK) 250 MG tablet     11/23/18 1641           Kerrie Buffalo [place], NP 11/23/18 2300    Geoffery Lyons, MD 11/28/18 2324

## 2019-04-15 ENCOUNTER — Other Ambulatory Visit: Payer: Self-pay

## 2019-04-15 DIAGNOSIS — Z20822 Contact with and (suspected) exposure to covid-19: Secondary | ICD-10-CM

## 2019-04-16 LAB — NOVEL CORONAVIRUS, NAA: SARS-CoV-2, NAA: NOT DETECTED

## 2019-05-14 ENCOUNTER — Ambulatory Visit (HOSPITAL_COMMUNITY)
Admission: EM | Admit: 2019-05-14 | Discharge: 2019-05-14 | Discharge status: Against Medical Advice | Payer: Medicaid Other

## 2019-05-14 NOTE — ED Notes (Signed)
Pt became impatient and LWBS

## 2019-12-18 ENCOUNTER — Ambulatory Visit
Admission: EM | Admit: 2019-12-18 | Discharge: 2019-12-18 | Disposition: A | Discharge status: Home / Self Care | Payer: BC Managed Care – PPO

## 2019-12-18 ENCOUNTER — Ambulatory Visit
Admission: RE | Admit: 2019-12-18 | Discharge: 2019-12-18 | Disposition: A | Discharge status: Home / Self Care | Payer: Medicaid Other | Source: Ambulatory Visit

## 2019-12-18 DIAGNOSIS — R197 Diarrhea, unspecified: Secondary | ICD-10-CM

## 2019-12-18 DIAGNOSIS — R110 Nausea: Secondary | ICD-10-CM | POA: Diagnosis not present

## 2019-12-18 DIAGNOSIS — R42 Dizziness and giddiness: Secondary | ICD-10-CM

## 2019-12-18 MED ORDER — ONDANSETRON HCL 4 MG/2ML IJ SOLN
4.0000 mg | Freq: Once | INTRAMUSCULAR | Status: AC
  Administered 2019-12-18: 15:00:00 4 mg via INTRAVENOUS

## 2019-12-18 MED ORDER — SODIUM CHLORIDE 0.9 % IV BOLUS
1000.0000 mL | Freq: Once | INTRAVENOUS | Status: AC
  Administered 2019-12-18: 1000 mL via INTRAVENOUS

## 2019-12-18 MED ORDER — DICYCLOMINE HCL 20 MG PO TABS: 20.0000 mg | ORAL_TABLET | Freq: Two times a day (BID) | ORAL | 0 refills | Status: DC

## 2019-12-18 MED ORDER — ONDANSETRON 4 MG PO TBDP: 4.0000 mg | ORAL_TABLET | Freq: Three times a day (TID) | ORAL | 0 refills | Status: DC | PRN

## 2019-12-18 NOTE — ED Provider Notes (Signed)
Virtual Visit via Video Note:  Lindsey Castillo  initiated request for Telemedicine visit with [facility] team. I connected with Lindsey Castillo  on 12/18/2019 at 1:28 PM  for a synchronized telemedicine visit using a video enabled HIPPA compliant telemedicine application. I verified that I am speaking with Lindsey Castillo  using two identifiers. Inice Sanluis Doree Fudge, PA-C  was physically located in a [facility] Urgent care site and [name] was located at a different location.   The limitations of evaluation and management by telemedicine as well as the availability of in-person appointments were discussed. Patient was informed that she  may incur a bill ( including co-pay) for this virtual visit encounter. Lindsey Castillo  expressed understanding and gave verbal consent to proceed with virtual visit.     History of Present Illness:Lindsey Castillo  is a 29 y.o. female presents with 6 day history of nausea, diarrhea, abdominal pain. States 6 days ago, woke up with abdominal cramping and diarrhea. While moving her bowels, had dizziness with "spotted vision". When she stood up, had worsening of these symptoms with lightheadedness. She was able to move to her bed, but then "passed out" on the bed. She was evaluated by EMS that night, and was told likely vasovagal reaction. She has not had another episode since. She has had 4+ episodes of diarrhea daily without melena, hematochezia. Nausea without vomiting. Today, has felt more lightheaded and weak. Denies fever. Still tolerating oral intake. Rhinorrhea without other URI symptoms. LMP 11/23/2019   Past Medical History:  Diagnosis Date  . Abnormal Pap smear    last pap 07/2012  . Asthma    USES INHALER  . Eczema   . Fracture, ankle AGE 22  . Headache(784.0)   . Infection    trich  . Infection    BV  . Infection    HX OF FREQUENT  . NSVD (normal spontaneous vaginal delivery) 01/01/2013   SVD at 1525  . Seasonal allergies     Allergies  Allergen  Reactions  . Other     Coconut        Observations/Objective: General: fatigued but nontoxic, no acute distress.  Head: Normocephalic, atraumatic Eye: No conjunctival injection, eyelid swelling. EOMI ENT: Mucus membranes moist, no lip cracking. No obvious nasal drainage. Pulm: Speaking in full sentences without difficulty. Normal effort. No respiratory distress, accessory muscle use. Neuro: Normal mental status. Alert and oriented x 3. Able to ambulate on own.   Assessment and Plan: Patient will follow up in office for full evaluation.   Follow Up Instructions:    I discussed the assessment and treatment plan with the patient. The patient was provided an opportunity to ask questions and all were answered. The patient agreed with the plan and demonstrated an understanding of the instructions.   The patient was advised to call back or seek an in-person evaluation if the symptoms worsen or if the condition fails to improve as anticipated.  I provided 13  minutes of non-face-to-face time during this encounter.    Belinda Fisher, PA-C  12/18/2019 1:28 PM         Belinda Fisher, PA-C 12/18/19 1421

## 2019-12-18 NOTE — Discharge Instructions (Signed)
[facility] at [facility] [address], [place], [place] [address] [phone]

## 2019-12-18 NOTE — ED Triage Notes (Signed)
Pt c/o constant center abdominal cramping and passing gas.

## 2019-12-18 NOTE — ED Triage Notes (Signed)
Pt states has had diarrhea since Friday. States she passed out on Friday after her first episode. States EMS came out and checked her out but she declined going to the hospital. Denies any more syncopal episodes but feels light headed. States hasn't had anything for the diarrhea but has been drinking lots of water.

## 2019-12-18 NOTE — ED Provider Notes (Signed)
[facility]    CSN: [id number] Arrival date & time: 12/18/19  1424      History   Chief Complaint Chief Complaint  Patient presents with  . Diarrhea    HPI LUCAS EXLINE is a 29 y.o. female.   Patient comes in to clinic for evaluation after video visit today  HPI with video visit:Nyjah S Heffler  is a 29 y.o. female presents with 6 day history of nausea, diarrhea, abdominal pain. States 6 days ago, woke up with abdominal cramping and diarrhea. While moving her bowels, had dizziness with "spotted vision". When she stood up, had worsening of these symptoms with lightheadedness. She was able to move to her bed, but then "passed out" on the bed. She was evaluated by EMS that night, and was told likely vasovagal reaction. She has not had another episode since. She has had 4+ episodes of diarrhea daily without melena, hematochezia. Nausea without vomiting. Today, has felt more lightheaded and weak. Denies fever. Still tolerating oral intake. Rhinorrhea without other URI symptoms. LMP 11/23/2019     Past Medical History:  Diagnosis Date  . Abnormal Pap smear    last pap 07/2012  . Asthma    USES INHALER  . Eczema   . Fracture, ankle AGE 64  . Headache(784.0)   . Infection    trich  . Infection    BV  . Infection    HX OF FREQUENT  . NSVD (normal spontaneous vaginal delivery) 01/01/2013   SVD at 1525  . Seasonal allergies     Patient Active Problem List   Diagnosis Date Noted  . Rhabdomyolysis 04/03/2017  . Anemia 04/03/2017  . Tobacco abuse 04/03/2017  . Vaginal discharge 04/03/2017  . Gonorrhea in female 09/14/2016  . Colitis, acute 09/14/2016  . SIRS (systemic inflammatory response syndrome) (HCC) 09/12/2016  . Right lower quadrant abdominal pain 09/12/2016  . NSVD (normal spontaneous vaginal delivery) 01/01/2013  . Asthma 08/02/2012  . Eczema 08/02/2012    Past Surgical History:  Procedure Laterality Date  . FOOT SURGERY Right    "15 splinters in my  foot"    OB History    Gravida  1   Para  1   Term  1   Preterm      AB      Living  1     SAB      TAB      Ectopic      Multiple      Live Births  1            Home Medications    Prior to Admission medications   Medication Sig Start Date End Date Taking? Authorizing Provider  escitalopram (LEXAPRO) 10 MG tablet Take 10 mg by mouth daily.   Yes [provider]  acetaminophen (TYLENOL) 500 MG tablet Take 1,000 mg by mouth every 6 (six) hours as needed for mild pain.    [provider]  albuterol (PROVENTIL HFA;VENTOLIN HFA) 108 (90 Base) MCG/ACT inhaler Inhale 2 puffs into the lungs every 4 (four) hours as needed for wheezing or shortness of breath (cough, shortness of breath or wheezing.). 10/02/18   Elvina Sidle, MD  dicyclomine (BENTYL) 20 MG tablet Take 1 tablet (20 mg total) by mouth 2 (two) times daily. 12/18/19   Cathie Hoops, Kriya Westra V, PA-C  ondansetron (ZOFRAN ODT) 4 MG disintegrating tablet Take 1 tablet (4 mg total) by mouth every 8 (eight) hours as needed for nausea or vomiting.  12/18/19   Belinda Fisher, PA-C    Family History Family History  Problem Relation Age of Onset  . Hypertension Father   . Cancer Maternal Grandmother   . Diabetes Maternal Grandmother   . Depression Mother   . Birth defects Sister        HEART MURMUR  . Asthma Brother   . Miscarriages / [place] Cousin   . Thyroid disease Sister     Social History Social History   Tobacco Use  . Smoking status: Current Every Day Smoker    Packs/day: 0.50    Years: 7.00    Pack years: 3.50    Types: Cigarettes    Last attempt to quit: 05/24/2012    Years since quitting: 7.5  . Smokeless tobacco: Never Used  Substance Use Topics  . Alcohol use: Yes    Comment: 09/13/2015 "I'll drink a couple times/week"  . Drug use: Not Currently    Types: Marijuana    Comment: 09/12/2016 "LAST USE 05/16/12"     Allergies   Other   Review of Systems Review of Systems  Reason unable to  perform ROS: See HPI as above.     Physical Exam Triage Vital Signs ED Triage Vitals  Enc Vitals Group     BP 12/18/19 1450 (!) 199/74     Pulse Rate 12/18/19 1450 85     Resp 12/18/19 1450 16     Temp 12/18/19 1450 98.5 F (36.9 C)     Temp Source 12/18/19 1450 Oral     SpO2 12/18/19 1450 98 %     Weight --      Height --      Head Circumference --      Peak Flow --      Pain Score 12/18/19 1451 5     Pain Loc --      Pain Edu? --      Excl. in GC? --    Orthostatic VS for the past 24 hrs:  BP- Lying Pulse- Lying BP- Sitting Pulse- Sitting BP- Standing at 0 minutes Pulse- Standing at 0 minutes  12/18/19 1455 100/62 85 110/59 88 113/77 92    Updated Vital Signs BP 119/74   Pulse 85   Temp 98.5 F (36.9 C) (Oral)   Resp 16   LMP 11/23/2019   SpO2 98%   Physical Exam Constitutional:      General: She is not in acute distress.    Appearance: She is well-developed. She is not ill-appearing, toxic-appearing or diaphoretic.  HENT:     Head: Normocephalic and atraumatic.  Eyes:     Conjunctiva/sclera: Conjunctivae normal.     Pupils: Pupils are equal, round, and reactive to light.  Cardiovascular:     Rate and Rhythm: Normal rate and regular rhythm.  Pulmonary:     Effort: Pulmonary effort is normal. No respiratory distress.     Comments: LCTAB Abdominal:     General: Bowel sounds are increased.     Palpations: Abdomen is soft.     Tenderness: There is generalized abdominal tenderness. There is no right CVA tenderness, left CVA tenderness, guarding or rebound.  Musculoskeletal:     Cervical back: Normal range of motion and neck supple.  Skin:    General: Skin is warm and dry.  Neurological:     Mental Status: She is alert and oriented to person, place, and time.  Psychiatric:        Behavior: Behavior normal.  Judgment: Judgment normal.      UC Treatments / Results  Labs (all labs ordered are listed, but only abnormal results are displayed) Labs  Reviewed - No data to display  EKG   Radiology No results found.  Procedures Procedures (including critical care time)  Medications Ordered in UC Medications  sodium chloride 0.9 % bolus 1,000 mL (0 mLs Intravenous Stopped 12/18/19 1614)  ondansetron (ZOFRAN) injection 4 mg (4 mg Intravenous Given 12/18/19 1515)    Initial Impression / Assessment and Plan / UC Course  I have reviewed the triage vital signs and the nursing notes.  Pertinent labs & imaging results that were available during my care of the patient were reviewed by me and considered in my medical decision making (see chart for details).    Continued diarrhea with weakness/dizziness. Orthostatic vitals stable, but patient is symptomatic. Will provide IV fluid, IV zofran.  Patient with improvement of symptoms after IV fluid. Symptomatic treatment with bentyl and zofran for now. Return precautions given.  Final Clinical Impressions(s) / UC Diagnoses   Final diagnoses:  Nausea without vomiting  Diarrhea, unspecified type    ED Prescriptions    Medication Sig Dispense Auth. Provider   dicyclomine (BENTYL) 20 MG tablet Take 1 tablet (20 mg total) by mouth 2 (two) times daily. 20 tablet Dnasia Gauna V, PA-C   ondansetron (ZOFRAN ODT) 4 MG disintegrating tablet Take 1 tablet (4 mg total) by mouth every 8 (eight) hours as needed for nausea or vomiting. 20 tablet Ok Edwards, PA-C     PDMP not reviewed this encounter.   Ok Edwards, PA-C 12/18/19 2234

## 2019-12-18 NOTE — Discharge Instructions (Signed)
Zofran for nausea and vomiting as needed. Bentyl for abdominal cramping. Keep hydrated, you urine should be clear to pale yellow in color. Bland diet, advance as tolerated. Monitor for any worsening of symptoms, nausea or vomiting not controlled by medication, worsening abdominal pain, fever, go to the emergency department for further evaluation needed.  °

## 2019-12-24 ENCOUNTER — Encounter: Payer: Self-pay | Admitting: Nurse Practitioner

## 2020-01-07 ENCOUNTER — Ambulatory Visit: Payer: BC Managed Care – PPO | Admitting: Nurse Practitioner

## 2020-01-29 ENCOUNTER — Ambulatory Visit
Admission: EM | Admit: 2020-01-29 | Discharge: 2020-01-29 | Disposition: A | Discharge status: Home / Self Care | Payer: BC Managed Care – PPO

## 2020-01-29 NOTE — ED Triage Notes (Signed)
Went to get patient.  Patient said she was going to Emergency Department.  Patient walked out of the door

## 2020-01-30 ENCOUNTER — Encounter (HOSPITAL_COMMUNITY): Payer: Self-pay

## 2020-01-30 ENCOUNTER — Emergency Department (HOSPITAL_COMMUNITY): Payer: BC Managed Care – PPO

## 2020-01-30 ENCOUNTER — Other Ambulatory Visit: Payer: Self-pay

## 2020-01-30 ENCOUNTER — Telehealth: Payer: Self-pay | Admitting: *Deleted

## 2020-01-30 ENCOUNTER — Emergency Department (HOSPITAL_COMMUNITY)
Admission: EM | Admit: 2020-01-30 | Discharge: 2020-01-30 | Disposition: A | Discharge status: Home / Self Care | Payer: BC Managed Care – PPO | Attending: Emergency Medicine | Admitting: Emergency Medicine

## 2020-01-30 DIAGNOSIS — J45909 Unspecified asthma, uncomplicated: Secondary | ICD-10-CM | POA: Diagnosis not present

## 2020-01-30 DIAGNOSIS — Z20822 Contact with and (suspected) exposure to covid-19: Secondary | ICD-10-CM | POA: Insufficient documentation

## 2020-01-30 DIAGNOSIS — Z79899 Other long term (current) drug therapy: Secondary | ICD-10-CM | POA: Insufficient documentation

## 2020-01-30 DIAGNOSIS — F17210 Nicotine dependence, cigarettes, uncomplicated: Secondary | ICD-10-CM | POA: Diagnosis not present

## 2020-01-30 DIAGNOSIS — R109 Unspecified abdominal pain: Secondary | ICD-10-CM | POA: Diagnosis present

## 2020-01-30 DIAGNOSIS — J189 Pneumonia, unspecified organism: Secondary | ICD-10-CM

## 2020-01-30 LAB — URINALYSIS, ROUTINE W REFLEX MICROSCOPIC
Bilirubin Urine: NEGATIVE
Glucose, UA: NEGATIVE mg/dL
Ketones, ur: NEGATIVE mg/dL
Nitrite: NEGATIVE
Protein, ur: NEGATIVE mg/dL
Specific Gravity, Urine: 1.015 (ref 1.005–1.030)
pH: 6 (ref 5.0–8.0)

## 2020-01-30 LAB — I-STAT BETA HCG BLOOD, ED (MC, WL, AP ONLY): I-stat hCG, quantitative: <5 m[IU]/mL (ref <5)

## 2020-01-30 LAB — CBC
HCT: 31.3 % — ABNORMAL LOW (ref 36.0–46.0)
Hemoglobin: 10.3 g/dL — ABNORMAL LOW (ref 12.0–15.0)
MCH: 28.1 pg (ref 26.0–34.0)
MCHC: 32.9 g/dL (ref 30.0–36.0)
MCV: 85.5 fL (ref 80.0–100.0)
Platelets: 229 10*3/uL (ref 150–400)
RBC: 3.66 MIL/uL — ABNORMAL LOW (ref 3.87–5.11)
RDW: 17.4 % — ABNORMAL HIGH (ref 11.5–15.5)
WBC: 12.9 10*3/uL — ABNORMAL HIGH (ref 4.0–10.5)
nRBC: 0 % (ref 0.0–0.2)

## 2020-01-30 LAB — BASIC METABOLIC PANEL
Anion gap: 10 (ref 5–15)
BUN: 8 mg/dL (ref 6–20)
CO2: 24 mmol/L (ref 22–32)
Calcium: 8.9 mg/dL (ref 8.9–10.3)
Chloride: 107 mmol/L (ref 98–111)
Creatinine, Ser: 0.78 mg/dL (ref 0.44–1.00)
GFR calc Af Amer: >60 mL/min (ref >60)
GFR calc non Af Amer: >60 mL/min (ref >60)
Glucose, Bld: 137 mg/dL — ABNORMAL HIGH (ref 70–99)
Potassium: 3.4 mmol/L — ABNORMAL LOW (ref 3.5–5.1)
Sodium: 141 mmol/L (ref 135–145)

## 2020-01-30 LAB — SARS CORONAVIRUS 2 BY RT PCR (HOSPITAL ORDER, PERFORMED IN ~~LOC~~ HOSPITAL LAB): SARS Coronavirus 2: NEGATIVE

## 2020-01-30 MED ORDER — FENTANYL CITRATE (PF) 100 MCG/2ML IJ SOLN
50.0000 ug | Freq: Once | INTRAMUSCULAR | Status: AC
  Administered 2020-01-30: 50 ug via INTRAVENOUS
  Filled 2020-01-30: qty 2

## 2020-01-30 MED ORDER — SODIUM CHLORIDE 0.9 % IV BOLUS (SEPSIS)
1000.0000 mL | Freq: Once | INTRAVENOUS | Status: AC
  Administered 2020-01-30: 1000 mL via INTRAVENOUS

## 2020-01-30 MED ORDER — SODIUM CHLORIDE 0.9 % IV SOLN
1.0000 g | Freq: Once | INTRAVENOUS | Status: AC
  Administered 2020-01-30: 1 g via INTRAVENOUS
  Filled 2020-01-30: qty 10

## 2020-01-30 MED ORDER — KETOROLAC TROMETHAMINE 15 MG/ML IJ SOLN
15.0000 mg | Freq: Once | INTRAMUSCULAR | Status: AC
  Administered 2020-01-30: 15 mg via INTRAVENOUS
  Filled 2020-01-30: qty 1

## 2020-01-30 MED ORDER — AZITHROMYCIN 250 MG PO TABS: ORAL_TABLET | ORAL | 0 refills | Status: DC

## 2020-01-30 MED ORDER — SODIUM CHLORIDE 0.9 % IV SOLN
500.0000 mg | Freq: Once | INTRAVENOUS | Status: AC
  Administered 2020-01-30: 500 mg via INTRAVENOUS
  Filled 2020-01-30: qty 500

## 2020-01-30 MED ORDER — NAPROXEN 500 MG PO TABS: 500.0000 mg | ORAL_TABLET | Freq: Two times a day (BID) | ORAL | 0 refills | Status: AC

## 2020-01-30 NOTE — Telephone Encounter (Signed)
TOC CM received call from pt stating [facility] is cheaper for [facility]. Wanted RX transferred [employer] spoke to [place]. Called in [employer]. Isidoro Donning RN CCM, [facility] ED TOC CM [phone]

## 2020-01-30 NOTE — ED Triage Notes (Signed)
Per EMS, patient c/o left flank pain and dysuria that has been worsening since this morning. Flank area painful to palpation. Patient endorses taking a muscle relaxer earlier today for the pain, but says it didn't help at all.  EMS gave 1000mg  tylenol & fentanyl.

## 2020-01-30 NOTE — ED Provider Notes (Signed)
[facility]-EMERGENCY DEPT Provider Note   CSN: [id number] Arrival date & time: 01/30/20  0138     History Chief Complaint  Patient presents with  . Flank Pain  . Fever    Lindsey Castillo is a 29 y.o. female.  Patient is a 29 y/o F with history of eczema presenting to the emergency department for left-sided flank pain with dysuria which has been progressively getting worse since this morning.  Denies history of kidney stones.  Reports that she is not sexually active.  Denies vaginal bleeding or vaginal discharge.  The pain is mostly in the left upper flank radiating to the left upper quadrant and worse with any movement.  Denies any fever, chills, nausea, vomiting        Past Medical History:  Diagnosis Date  . Abnormal Pap smear    last pap 07/2012  . Asthma    USES INHALER  . Eczema   . Fracture, ankle AGE 12  . Headache(784.0)   . Infection    trich  . Infection    BV  . Infection    HX OF FREQUENT  . NSVD (normal spontaneous vaginal delivery) 01/01/2013   SVD at 1525  . Seasonal allergies     Patient Active Problem List   Diagnosis Date Noted  . Rhabdomyolysis 04/03/2017  . Anemia 04/03/2017  . Tobacco abuse 04/03/2017  . Vaginal discharge 04/03/2017  . Gonorrhea in female 09/14/2016  . Colitis, acute 09/14/2016  . SIRS (systemic inflammatory response syndrome) (HCC) 09/12/2016  . Right lower quadrant abdominal pain 09/12/2016  . NSVD (normal spontaneous vaginal delivery) 01/01/2013  . Asthma 08/02/2012  . Eczema 08/02/2012    Past Surgical History:  Procedure Laterality Date  . FOOT SURGERY Right    "15 splinters in my foot"     OB History    Gravida  1   Para  1   Term  1   Preterm      AB      Living  1     SAB      TAB      Ectopic      Multiple      Live Births  1           Family History  Problem Relation Age of Onset  . Hypertension Father   . Cancer Maternal Grandmother   . Diabetes Maternal  Grandmother   . Depression Mother   . Birth defects Sister        HEART MURMUR  . Asthma Brother   . Miscarriages / [place] Cousin   . Thyroid disease Sister     Social History   Tobacco Use  . Smoking status: Current Every Day Smoker    Packs/day: 0.50    Years: 7.00    Pack years: 3.50    Types: Cigarettes    Last attempt to quit: 05/24/2012    Years since quitting: 7.6  . Smokeless tobacco: Never Used  Substance Use Topics  . Alcohol use: Yes    Comment: 09/13/2015 "I'll drink a couple times/week"  . Drug use: Not Currently    Types: Marijuana    Comment: 09/12/2016 "LAST USE 05/16/12"    Home Medications Prior to Admission medications   Medication Sig Start Date End Date Taking? Authorizing Provider  escitalopram (LEXAPRO) 10 MG tablet Take 10 mg by mouth daily.   Yes [provider]  azithromycin (ZITHROMAX) 250 MG tablet Zpack instructions. 500mg  day  1and 250mg  each day after until gone 01/30/20   Madilyn Hook A, PA-C  naproxen (NAPROSYN) 500 MG tablet Take 1 tablet (500 mg total) by mouth 2 (two) times daily for 7 days. 01/30/20 02/06/20  Lindsey Apley, PA-C    Allergies    Other  Review of Systems   Review of Systems  Constitutional: Negative for chills and fever.  HENT: Negative.   Respiratory: Negative for cough.   Cardiovascular: Negative for chest pain.  Gastrointestinal: Positive for abdominal pain. Negative for nausea and vomiting.  Genitourinary: Positive for dysuria and flank pain. Negative for vaginal bleeding, vaginal discharge and vaginal pain.  Musculoskeletal: Negative for back pain.  Skin: Negative for rash.  Neurological: Negative for dizziness.  All other systems reviewed and are negative.   Physical Exam Updated Vital Signs BP 115/80   Pulse 77   Temp 98.4 F (36.9 C) (Oral)   Resp 18   Ht 5\' 6"  (1.676 m)   Wt 63.5 kg   SpO2 100%   BMI 22.60 kg/m   Physical Exam Vitals and nursing note reviewed.  Constitutional:       General: She is not in acute distress.    Appearance: Normal appearance. She is not ill-appearing, toxic-appearing or diaphoretic.  HENT:     Head: Normocephalic.     Mouth/Throat:     Mouth: Mucous membranes are moist.  Eyes:     Conjunctiva/sclera: Conjunctivae normal.  Cardiovascular:     Rate and Rhythm: Normal rate and regular rhythm.  Pulmonary:     Effort: Pulmonary effort is normal.  Abdominal:     General: Abdomen is flat.     Tenderness: There is abdominal tenderness (LUQ, ). There is left CVA tenderness. There is no guarding.  Skin:    General: Skin is warm and dry.     Findings: No erythema or rash.  Neurological:     Mental Status: She is alert.  Psychiatric:        Mood and Affect: Mood normal.     ED Results / Procedures / Treatments   Labs (all labs ordered are listed, but only abnormal results are displayed) Labs Reviewed  BASIC METABOLIC PANEL - Abnormal; Notable for the following components:      Result Value   Potassium 3.4 (*)    Glucose, Bld 137 (*)    All other components within normal limits  CBC - Abnormal; Notable for the following components:   WBC 12.9 (*)    RBC 3.66 (*)    Hemoglobin 10.3 (*)    HCT 31.3 (*)    RDW 17.4 (*)    All other components within normal limits  URINALYSIS, ROUTINE W REFLEX MICROSCOPIC - Abnormal; Notable for the following components:   Hgb urine dipstick SMALL (*)    Leukocytes,Ua TRACE (*)    Bacteria, UA RARE (*)    All other components within normal limits  SARS CORONAVIRUS 2 BY RT PCR (HOSPITAL ORDER, [facility])  I-STAT BETA HCG BLOOD, ED ([facility], [facility], [facility] ONLY)    EKG None  Radiology DG Chest Portable 1 View  Result Date: 01/30/2020 CLINICAL DATA:  Initial evaluation for acute flank pain, fever. Community-acquired pneumonia. EXAM: PORTABLE CHEST 1 VIEW COMPARISON:  Prior CT from earlier the same day. FINDINGS: Patient is rotated to the right. Allowing for rotation, cardiac and  mediastinal silhouettes within normal limits. Lungs are hypoinflated. Hazy opacity at the medial left lung base, concerning for  infiltrate/pneumonia, corresponding with finding on prior CT. Lungs are otherwise largely clear. No edema or visible effusion. No pneumothorax. Visualized soft tissues and osseous structures within normal limits. IMPRESSION: Hazy opacity at the medial left lung base, concerning for infiltrate/pneumonia. Finding corresponds with abnormality seen on prior CT. Electronically Signed   By: Rise Mu M.D.   On: 01/30/2020 04:50   CT Renal Stone Study  Result Date: 01/30/2020 CLINICAL DATA:  Left flank pain. EXAM: CT ABDOMEN AND PELVIS WITHOUT CONTRAST TECHNIQUE: Multidetector CT imaging of the abdomen and pelvis was performed following the standard protocol without IV contrast. COMPARISON:  September 12, 2016 FINDINGS: Lower chest: There is an area of consolidation at the left lung base concerning for pneumonia. There is atelectasis and scarring at the right lung base.The heart size is normal. The intracardiac blood pool is hypodense relative to the adjacent myocardium consistent with anemia. Hepatobiliary: The liver is normal. Normal gallbladder.There is no biliary ductal dilation. Pancreas: Normal contours without ductal dilatation. No peripancreatic fluid collection. Spleen: Unremarkable. Adrenals/Urinary Tract: --Adrenal glands: Unremarkable. --Right kidney/ureter: No hydronephrosis or radiopaque kidney stones. --Left kidney/ureter: No hydronephrosis or radiopaque kidney stones. --Urinary bladder: Unremarkable. Stomach/Bowel: --Stomach/Duodenum: No hiatal hernia or other gastric abnormality. Normal duodenal course and caliber. --Small bowel: Unremarkable. --Colon: Unremarkable. --Appendix: Normal. Vascular/Lymphatic: Normal course and caliber of the major abdominal vessels. --No retroperitoneal lymphadenopathy. --No mesenteric lymphadenopathy. --No pelvic or inguinal  lymphadenopathy. Reproductive: Unremarkable Other: There is a small volume of pelvic free fluid which is likely physiologic. No free air. The abdominal wall is normal. Musculoskeletal. No acute displaced fractures. IMPRESSION: 1. No acute abdominopelvic abnormality. 2. Left lower lobe consolidation concerning for pneumonia. Electronically Signed   By: Katherine Mantle M.D.   On: 01/30/2020 03:05    Procedures Procedures (including critical care time)  Medications Ordered in ED Medications  ketorolac (TORADOL) 15 MG/ML injection 15 mg (15 mg Intravenous Given 01/30/20 0206)  sodium chloride 0.9 % bolus 1,000 mL (0 mLs Intravenous Stopped 01/30/20 0305)  cefTRIAXone (ROCEPHIN) 1 g in sodium chloride 0.9 % 100 mL IVPB (1 g Intravenous New Bag/Given 01/30/20 0423)  azithromycin (ZITHROMAX) 500 mg in sodium chloride 0.9 % 250 mL IVPB (500 mg Intravenous New Bag/Given 01/30/20 0431)  fentaNYL (SUBLIMAZE) injection 50 mcg (50 mcg Intravenous Given 01/30/20 0427)    ED Course  I have reviewed the triage vital signs and the nursing notes.  Pertinent labs & imaging results that were available during my care of the patient were reviewed by me and considered in my medical decision making (see chart for details).  Clinical Course as of Jan 29 538  Thu Jan 30, 2020  3716 Patient presenting with L side flank pain and reported fever with dysuria. She did have L CVA tenderness on exam and appeared uncomfortable. CT renal study ordered for stone assessment. Ct showed probable LL pneumonia. A chest xray and covid test was performed to further assess. She is not having SOB or chest pain here and is otherwise PERC negative.    [KM]  0456 Xray re-demonstrates infiltrate. Patient treated with rocephin, zithromax for CAP. Well appearing with normal vitals and not meeting admission criteria. Advised her to get f/u chest xray in one week to be sure resolution of the pneumonia.    [KM]    Clinical Course User  Index [KM] Lindsey Castillo   MDM Rules/Calculators/A&P  Based on review of vitals, medical screening exam, lab work and/or imaging, there does not appear to be an acute, emergent etiology for the patient's symptoms. Counseled pt on good return precautions and encouraged both PCP and ED follow-up as needed.  Prior to discharge, I also discussed incidental imaging findings with patient in detail and advised appropriate, recommended follow-up in detail.  Clinical Impression: 1. Community acquired pneumonia of left lower lobe of lung     Disposition: Discharge  Prior to providing a prescription for a controlled substance, I independently reviewed the patient's recent prescription history on the [place] Controlled Substance Reporting System. The patient had no recent or regular prescriptions and was deemed appropriate for a brief, less than 3 day prescription of narcotic for acute analgesia.  This note was prepared with assistance of Conservation officer, historic buildings. Occasional wrong-word or sound-a-like substitutions may have occurred due to the inherent limitations of voice recognition software.  Final Clinical Impression(s) / ED Diagnoses Final diagnoses:  Community acquired pneumonia of left lower lobe of lung    Rx / DC Orders ED Discharge Orders         Ordered    azithromycin (ZITHROMAX) 250 MG tablet     01/30/20 0459    naproxen (NAPROSYN) 500 MG tablet  2 times daily     01/30/20 0459           Arlyn Dunning, PA-C 01/30/20 0539    Ward, Layla Maw, DO 01/30/20 [phone]

## 2020-01-30 NOTE — Discharge Instructions (Addendum)
You were seen today for left sign pain. It appears you have a pneumonia in your left lung. We have treated you with antibiotics and pain medication. We have sent prescriptions to the pharmacy for you to complete. It is very important you FOLLOW UP YOUR YOUR PRIMARY DOCTOR IN 1 WEEK to make sure your xray is improving or you may need further imaging. Thank you for allowing me to care for you today. Please return to the emergency department if you have new or worsening symptoms. Take your medications as instructed.

## 2020-02-12 ENCOUNTER — Ambulatory Visit (INDEPENDENT_AMBULATORY_CARE_PROVIDER_SITE_OTHER): Payer: BC Managed Care – PPO

## 2020-02-12 ENCOUNTER — Ambulatory Visit (HOSPITAL_COMMUNITY)
Admission: EM | Admit: 2020-02-12 | Discharge: 2020-02-12 | Disposition: A | Discharge status: Home / Self Care | Payer: BC Managed Care – PPO | Attending: Family Medicine | Admitting: Family Medicine

## 2020-02-12 ENCOUNTER — Encounter (HOSPITAL_COMMUNITY): Payer: Self-pay

## 2020-02-12 ENCOUNTER — Other Ambulatory Visit: Payer: Self-pay

## 2020-02-12 DIAGNOSIS — J189 Pneumonia, unspecified organism: Secondary | ICD-10-CM | POA: Diagnosis not present

## 2020-02-12 DIAGNOSIS — R0789 Other chest pain: Secondary | ICD-10-CM

## 2020-02-12 NOTE — ED Provider Notes (Signed)
[place]   [id number] 02/12/20 Arrival Time: 0626  ASSESSMENT & PLAN:  1. Pneumonia due to infectious organism, unspecified laterality, unspecified part of lung   2. Intermittent left-sided chest pain     I have personally viewed the imaging studies ordered this visit. CXR: Resolved PNA.  Overall feeling much better. Still with a sporadic cough.   Discharge Instructions     If not allergic, you may use over the counter ibuprofen or acetaminophen as needed.     Follow-up Information    Medicine, [facility].   Specialty: Family Medicine Why: As needed. Contact information: [place] [id number] [phone]           Reviewed expectations re: course of current medical issues. Questions answered. Outlined signs and symptoms indicating need for more acute intervention. Patient verbalized understanding. After Visit Summary given.   SUBJECTIVE: History from: patient.  Lindsey Castillo is a 29 y.o. female who reports being recently treated for PNA dx on 5/20/92021. Overall feeling better but still with sporadic left-sided chest pain. No associated SOB. Afebrile. Finished all antibiotics. Recent COVID exposure; awaiting test results from outside facility. Was told to obtain f/u CXR; would like to do this today.   Social History   Tobacco Use  Smoking Status Current Every Day Smoker  . Packs/day: 0.50  . Years: 7.00  . Pack years: 3.50  . Types: Cigarettes  . Last attempt to quit: 05/24/2012  . Years since quitting: 7.7  Smokeless Tobacco Never Used     OBJECTIVE:  Vitals:   02/12/20 1320  BP: 125/85  Pulse: 70  Resp: 16  Temp: 99.8 F (37.7 C)  TempSrc: Oral  SpO2: 100%     General appearance: alert; appears fatigued HEENT: without nasal congestion Neck: supple without LAD CV: RRR Lungs: unlabored respirations, symmetrical air entry without wheezing; cough: absent; CTAB Abd: soft Ext: no LE edema Skin:  warm and dry Psychological: alert and cooperative; normal mood and affect    Allergies  Allergen Reactions  . Other     Coconut    Past Medical History:  Diagnosis Date  . Abnormal Pap smear    last pap 07/2012  . Asthma    USES INHALER  . Eczema   . Fracture, ankle AGE 37  . Headache(784.0)   . Infection    trich  . Infection    BV  . Infection    HX OF FREQUENT  . NSVD (normal spontaneous vaginal delivery) 01/01/2013   SVD at 1525  . Seasonal allergies    Family History  Problem Relation Age of Onset  . Hypertension Father   . Cancer Maternal Grandmother   . Diabetes Maternal Grandmother   . Depression Mother   . Birth defects Sister        HEART MURMUR  . Asthma Brother   . Miscarriages / [place] Cousin   . Thyroid disease Sister    Social History   Socioeconomic History  . Marital status: Single    Spouse name: Not on file  . Number of children: Not on file  . Years of education: 79  . Highest education level: Not on file  Occupational History  . Occupation: Scientist, water quality  Tobacco Use  . Smoking status: Current Every Day Smoker    Packs/day: 0.50    Years: 7.00    Pack years: 3.50    Types: Cigarettes    Last attempt to quit: 05/24/2012  Years since quitting: 7.7  . Smokeless tobacco: Never Used  Substance and Sexual Activity  . Alcohol use: Yes    Comment: 09/13/2015 "I'll drink a couple times/week"  . Drug use: Not Currently    Types: Marijuana    Comment: 09/12/2016 "LAST USE 05/16/12"  . Sexual activity: Yes    Partners: Male    Birth control/protection: None  Other Topics Concern  . Not on file  Social History Narrative  . Not on file   Social Determinants of Health   Financial Resource Strain:   . Difficulty of Paying Living Expenses:   Food Insecurity:   . Worried About Programme researcher, broadcasting/film/video in the Last Year:   . Barista in the Last Year:   Transportation Needs:   . Freight forwarder (Medical):   Marland Kitchen Lack of  Transportation (Non-Medical):   Physical Activity:   . Days of Exercise per Week:   . Minutes of Exercise per Session:   Stress:   . Feeling of Stress :   Social Connections:   . Frequency of Communication with Friends and Family:   . Frequency of Social Gatherings with Friends and Family:   . Attends Religious Services:   . Active Member of Clubs or Organizations:   . Attends Banker Meetings:   Marland Kitchen Marital Status:   Intimate Partner Violence:   . Fear of Current or Ex-Partner:   . Emotionally Abused:   Marland Kitchen Physically Abused:   . Sexually Abused:            Mardella Layman, MD 02/12/20 1409

## 2020-02-12 NOTE — ED Triage Notes (Signed)
Pt presents for follow up chest xray; pt was diagnosed with pneumonia about 2 weeks ago , pt has completed antibiotics but is still have pain on left side by ribs and flank area.  Pt has pending covid test from yesterday from [facility].

## 2020-02-12 NOTE — Discharge Instructions (Addendum)
If not allergic, you may use over the counter ibuprofen or acetaminophen as needed. ° °

## 2020-05-20 ENCOUNTER — Telehealth: Payer: BC Managed Care – PPO | Admitting: Family

## 2020-05-20 ENCOUNTER — Ambulatory Visit (HOSPITAL_COMMUNITY): Admit: 2020-05-20 | Discharge status: Against Medical Advice | Payer: Medicaid Other

## 2020-05-20 DIAGNOSIS — R112 Nausea with vomiting, unspecified: Secondary | ICD-10-CM

## 2020-05-20 DIAGNOSIS — R519 Headache, unspecified: Secondary | ICD-10-CM

## 2020-05-20 NOTE — Progress Notes (Signed)
Based on what you shared with me, I feel your condition warrants further evaluation and I recommend that you be seen for a face to face office visit.   Given you have had vomiting for greater than 3 days with a headache, you need to be seen face to face. Usually viral GI bugs should have already resolved.    NOTE: If you entered your credit card information for this eVisit, you will not be charged. You may see a "hold" on your card for the $35 but that hold will drop off and you will not have a charge processed.   If you are having a true medical emergency please call 911.      For an urgent face to face visit, [place] has five urgent care centers for your convenience:      NEW:  [facility] at [facility] Directions [phone] [address] [place], [place] [address] . 10 am - 6pm Monday - Friday    [facility]) Get Driving Directions [phone] [address] [place], [place] [address] . 10 am to 8 pm Monday-Friday . 12 pm to 8 pm [facility] Urgent Care at [facility] Get Driving Directions [phone] [address] [place], [place] [address] . 8 am to 8 pm Monday-Friday . 9 am to 6 pm Saturday . 11 am to 6 pm Sunday     [facility] Ur[facility] Care at [facility] Get Driving Directions  [phone] [address] [place], [place] [address] . 8 am to 8 pm Monday-Friday . 8 am to 4 pm [facility] Urgent Care at [facility] Directions [phone] [address] [place], [place] [address] . 12 pm to 6 pm Monday-Friday      Your e-visit answers were reviewed by a board certified advanced clinical practitioner to complete your personal care plan.  Thank you for using e-Visits.

## 2020-07-23 ENCOUNTER — Encounter: Payer: Self-pay | Admitting: Gastroenterology

## 2020-09-21 ENCOUNTER — Ambulatory Visit: Payer: Medicaid Other | Admitting: Gastroenterology

## 2020-11-05 ENCOUNTER — Ambulatory Visit: Payer: Medicaid Other | Admitting: Gastroenterology

## 2020-12-02 ENCOUNTER — Other Ambulatory Visit: Payer: Self-pay

## 2020-12-02 ENCOUNTER — Encounter (HOSPITAL_COMMUNITY): Payer: Self-pay

## 2020-12-02 ENCOUNTER — Ambulatory Visit (HOSPITAL_COMMUNITY)
Admission: EM | Admit: 2020-12-02 | Discharge: 2020-12-02 | Disposition: A | Discharge status: Home / Self Care | Payer: Medicaid Other | Attending: Medical Oncology | Admitting: Medical Oncology

## 2020-12-02 DIAGNOSIS — H6593 Unspecified nonsuppurative otitis media, bilateral: Secondary | ICD-10-CM | POA: Diagnosis not present

## 2020-12-02 DIAGNOSIS — R519 Headache, unspecified: Secondary | ICD-10-CM | POA: Diagnosis not present

## 2020-12-02 DIAGNOSIS — J302 Other seasonal allergic rhinitis: Secondary | ICD-10-CM | POA: Diagnosis not present

## 2020-12-02 DIAGNOSIS — H8111 Benign paroxysmal vertigo, right ear: Secondary | ICD-10-CM

## 2020-12-02 LAB — CBG MONITORING, ED: Glucose-Capillary: 75 mg/dL (ref 70–99)

## 2020-12-02 MED ORDER — MECLIZINE HCL 25 MG PO TABS: 25.0000 mg | ORAL_TABLET | Freq: Three times a day (TID) | ORAL | 0 refills | Status: DC | PRN

## 2020-12-02 MED ORDER — FLUTICASONE PROPIONATE 50 MCG/ACT NA SUSP: 2.0000 | Freq: Every day | NASAL | 0 refills | Status: DC

## 2020-12-02 NOTE — ED Provider Notes (Signed)
[facility]    CSN: [id number] Arrival date & time: 12/02/20  1428      History   Chief Complaint Chief Complaint  Patient presents with  . Dizziness  . Headache    HPI Lindsey Castillo is a 30 y.o. female.   HPI  Dizziness: Pt states that for the past 4 days she has had headache and dizziness. She states that when she lays down that the room appears to spin. She also feels off balance when she stands up. She started to have a headache off and on and mild dizziness about 2 weeks ago when she suddenly stopped her lexapro as she ran out of it. She has restarted this medication and the headaches have improved but dizziness has not. She also has noted some bilateral ear fullness and clear ear drainage. She denies chest pains, nausea, vomiting, ABD pain, diarrhea, dysuria. No head injury.   Past Medical History:  Diagnosis Date  . Abnormal Pap smear    last pap 07/2012  . Allergy   . Anemia   . Anxiety   . Asthma    USES INHALER  . Depression   . Eczema   . Fracture, ankle AGE 49  . Headache(784.0)   . Infection    trich  . Infection    BV  . Infection    HX OF FREQUENT  . NSVD (normal spontaneous vaginal delivery) 01/01/2013   SVD at 1525  . Seasonal allergies     Patient Active Problem List   Diagnosis Date Noted  . Rhabdomyolysis 04/03/2017  . Anemia 04/03/2017  . Tobacco abuse 04/03/2017  . Vaginal discharge 04/03/2017  . Gonorrhea in female 09/14/2016  . Colitis, acute 09/14/2016  . SIRS (systemic inflammatory response syndrome) (HCC) 09/12/2016  . Right lower quadrant abdominal pain 09/12/2016  . NSVD (normal spontaneous vaginal delivery) 01/01/2013  . Asthma 08/02/2012  . Eczema 08/02/2012    Past Surgical History:  Procedure Laterality Date  . FOOT SURGERY Right    "15 splinters in my foot"    OB History    Gravida  1   Para  1   Term  1   Preterm      AB      Living  1     SAB      IAB      Ectopic      Multiple       Live Births  1            Home Medications    Prior to Admission medications   Medication Sig Start Date End Date Taking? Authorizing Provider  acetaminophen (TYLENOL) 500 MG tablet Take 500 mg by mouth every 6 (six) hours as needed.   Yes [provider]  azithromycin (ZITHROMAX) 250 MG tablet Zpack instructions. 500mg  day 1and 250mg  each day after until gone 01/30/20   A, PA-C  escitalopram (LEXAPRO) 10 MG tablet Take 10 mg by mouth daily.    [provider]    Family History Family History  Problem Relation Age of Onset  . Hypertension Father   . Heart disease Father   . Thyroid disease Father   . Alcoholism Father   . Asthma Father   . Cancer Maternal Grandmother   . Diabetes Maternal Grandmother   . Kidney disease Maternal Grandmother   . Arthritis Maternal Grandmother   . Hypertension Maternal Grandmother   . Depression Mother   . Mental illness Mother   .  Birth defects Sister        HEART MURMUR  . Asthma Brother   . Miscarriages / [place] Cousin   . Thyroid disease Sister     Social History Social History   Tobacco Use  . Smoking status: Current Every Day Smoker    Packs/day: 0.50    Years: 7.00    Pack years: 3.50    Types: Cigarettes    Last attempt to quit: 05/24/2012    Years since quitting: 8.5  . Smokeless tobacco: Never Used  Vaping Use  . Vaping Use: Never used  Substance Use Topics  . Alcohol use: Yes    Comment: 09/13/2015 "I'll drink a couple times/week"  . Drug use: Not Currently    Types: Marijuana    Comment: 09/12/2016 "LAST USE 05/16/12"     Allergies   Other   Review of Systems Review of Systems  As stated above in HPI Physical Exam Triage Vital Signs ED Triage Vitals  Enc Vitals Group     BP 12/02/20 1446 113/84     Pulse Rate 12/02/20 1446 89     Resp 12/02/20 1446 16     Temp 12/02/20 1446 98.2 F (36.8 C)     Temp Source 12/02/20 1446 Oral     SpO2 12/02/20 1446 100 %      Weight --      Height --      Head Circumference --      Peak Flow --      Pain Score 12/02/20 1443 2     Pain Loc --      Pain Edu? --      Excl. in GC? --    No data found.  Updated Vital Signs BP 113/84 (BP Location: Right Arm)   Pulse 89   Temp 98.2 F (36.8 C) (Oral)   Resp 16   LMP 11/24/2020 (Exact Date)   SpO2 100%   Physical Exam Vitals and nursing note reviewed.  Constitutional:      General: She is not in acute distress.    Appearance: She is well-developed. She is not ill-appearing, toxic-appearing or diaphoretic.  HENT:     Head: Normocephalic and atraumatic.     Mouth/Throat:     Mouth: Mucous membranes are moist.     Pharynx: Oropharynx is clear.  Eyes:     Extraocular Movements: Extraocular movements intact.     Right eye: Nystagmus present. Normal extraocular motion.     Left eye: Normal extraocular motion and no nystagmus.     Pupils: Pupils are equal, round, and reactive to light. Pupils are equal.     Right eye: Pupil is round and reactive.     Left eye: Pupil is round and reactive.  Cardiovascular:     Rate and Rhythm: Normal rate and regular rhythm.  Pulmonary:     Breath sounds: Normal breath sounds.  Musculoskeletal:     Cervical back: Normal range of motion and neck supple. No rigidity.  Lymphadenopathy:     Cervical: No cervical adenopathy.  Skin:    General: Skin is warm.  Neurological:     Mental Status: She is alert and oriented to person, place, and time.     Cranial Nerves: No cranial nerve deficit or facial asymmetry.     Sensory: No sensory deficit.     Motor: No weakness.     Gait: Gait normal.     Deep Tendon Reflexes: Reflexes normal.  Psychiatric:  Mood and Affect: Mood normal.        Behavior: Behavior normal.      UC Treatments / Results  Labs (all labs ordered are listed, but only abnormal results are displayed) Labs Reviewed  CBG MONITORING, ED    EKG   Radiology No results  found.  Procedures Procedures (including critical care time)  Medications Ordered in UC Medications - No data to display  Initial Impression / Assessment and Plan / UC Course  I have reviewed the triage vital signs and the nursing notes.  Pertinent labs & imaging results that were available during my care of the patient were reviewed by me and considered in my medical decision making (see chart for details).     New. Likely multifactorial. Headache likely secondary to abrupt cessation of lexapro. The cure should be to continue taking it as she has been. The dizziness appears to be BPPV likely worsened due to seasonal allergies and middle ear effusion. Treating with flonase and home maneuvers to help with the BPPV along with meclizine.      Final Clinical Impressions(s) / UC Diagnoses   Final diagnoses:  None   Discharge Instructions   None    ED Prescriptions    None     PDMP not reviewed this encounter.   Rushie Chestnut, [place] 12/02/20 1524

## 2020-12-02 NOTE — ED Triage Notes (Signed)
Pt reports dizziness and headache x 4 days. States the room spinning when laying down; feeling "weird and shaky" when standing up. Denies chest pain, nausea, vomiting, abdominal pain, diarrhea.   Pt gives relief to headache, last dose 1 hr ago.

## 2020-12-17 DIAGNOSIS — F411 Generalized anxiety disorder: Secondary | ICD-10-CM | POA: Insufficient documentation

## 2020-12-17 DIAGNOSIS — F1011 Alcohol abuse, in remission: Secondary | ICD-10-CM | POA: Insufficient documentation

## 2020-12-17 DIAGNOSIS — F410 Panic disorder [episodic paroxysmal anxiety] without agoraphobia: Secondary | ICD-10-CM | POA: Insufficient documentation

## 2021-01-21 DIAGNOSIS — F988 Other specified behavioral and emotional disorders with onset usually occurring in childhood and adolescence: Secondary | ICD-10-CM | POA: Insufficient documentation

## 2021-01-22 DIAGNOSIS — J309 Allergic rhinitis, unspecified: Secondary | ICD-10-CM | POA: Insufficient documentation

## 2021-01-27 ENCOUNTER — Other Ambulatory Visit: Payer: Self-pay

## 2021-01-27 ENCOUNTER — Ambulatory Visit (HOSPITAL_COMMUNITY)
Admission: EM | Admit: 2021-01-27 | Discharge: 2021-01-27 | Disposition: A | Discharge status: Home / Self Care | Payer: Medicaid Other

## 2021-01-27 ENCOUNTER — Encounter (HOSPITAL_COMMUNITY): Payer: Self-pay

## 2021-01-27 DIAGNOSIS — I96 Gangrene, not elsewhere classified: Secondary | ICD-10-CM

## 2021-01-27 NOTE — ED Triage Notes (Signed)
Pt presents with what appeared to be be as fungus on her tongue and on inside of her lip this morning; pt states she has no discomfort.

## 2021-01-27 NOTE — ED Provider Notes (Signed)
[facility]  ____________________________________________  Time seen: Approximately 11:05 AM  I have reviewed the triage vital signs and the nursing notes.   HISTORY  Chief Complaint Mouth Lesions   Historian Patient     HPI Lindsey Castillo is a 30 y.o. female presents to the emergency department with concern for a possible rash along the oral mucosa of the lower lip.  Patient states that when she awoke this morning to brush her teeth she noticed yellow skin which was easily displaced by her toothbrush.  She denies pain in the affected area.  She states that she has been congested at night and her lips have been exceptionally dry.   Past Medical History:  Diagnosis Date  . Abnormal Pap smear    last pap 07/2012  . Allergy   . Anemia   . Anxiety   . Asthma    USES INHALER  . Depression   . Eczema   . Fracture, ankle AGE 48  . Headache(784.0)   . Infection    trich  . Infection    BV  . Infection    HX OF FREQUENT  . NSVD (normal spontaneous vaginal delivery) 01/01/2013   SVD at 1525  . Seasonal allergies      Immunizations up to date:  Yes.     Past Medical History:  Diagnosis Date  . Abnormal Pap smear    last pap 07/2012  . Allergy   . Anemia   . Anxiety   . Asthma    USES INHALER  . Depression   . Eczema   . Fracture, ankle AGE 48  . Headache(784.0)   . Infection    trich  . Infection    BV  . Infection    HX OF FREQUENT  . NSVD (normal spontaneous vaginal delivery) 01/01/2013   SVD at 1525  . Seasonal allergies     Patient Active Problem List   Diagnosis Date Noted  . Rhabdomyolysis 04/03/2017  . Anemia 04/03/2017  . Tobacco abuse 04/03/2017  . Vaginal discharge 04/03/2017  . Gonorrhea in female 09/14/2016  . Colitis, acute 09/14/2016  . SIRS (systemic inflammatory response syndrome) (HCC) 09/12/2016  . Right lower quadrant abdominal pain 09/12/2016  . NSVD (normal spontaneous vaginal delivery) 01/01/2013  . Asthma  08/02/2012  . Eczema 08/02/2012    Past Surgical History:  Procedure Laterality Date  . FOOT SURGERY Right    "15 splinters in my foot"    Prior to Admission medications   Medication Sig Start Date End Date Taking? Authorizing Provider  amphetamine-dextroamphetamine (ADDERALL) 15 MG tablet Take 15 mg by mouth daily.   Yes [provider]  cetirizine (ZYRTEC) 5 MG tablet Take 5 mg by mouth daily.   Yes [provider]  hydrOXYzine (ATARAX/VISTARIL) 10 MG tablet Take 10 mg by mouth 3 (three) times daily as needed.   Yes [provider]  acetaminophen (TYLENOL) 500 MG tablet Take 500 mg by mouth every 6 (six) hours as needed.    [provider]  azithromycin (ZITHROMAX) 250 MG tablet Zpack instructions. 500mg  day 1and 250mg  each day after until gone 01/30/20   A, PA-C  escitalopram (LEXAPRO) 10 MG tablet Take 10 mg by mouth daily.    [provider]  fluticasone (FLONASE) 50 MCG/ACT nasal spray Place 2 sprays into both nostrils daily. 12/02/20   Ronnie Doss, PA-C  meclizine (ANTIVERT) 25 MG tablet Take 1 tablet (25 mg total) by mouth 3 (  three) times daily as needed for dizziness. 12/02/20   Rushie Chestnut, PA-C    Allergies Other  Family History  Problem Relation Age of Onset  . Hypertension Father   . Heart disease Father   . Thyroid disease Father   . Alcoholism Father   . Asthma Father   . Cancer Maternal Grandmother   . Diabetes Maternal Grandmother   . Kidney disease Maternal Grandmother   . Arthritis Maternal Grandmother   . Hypertension Maternal Grandmother   . Depression Mother   . Mental illness Mother   . Birth defects Sister        HEART MURMUR  . Asthma Brother   . Miscarriages / [place] Cousin   . Thyroid disease Sister     Social History Social History   Tobacco Use  . Smoking status: Current Every Day Smoker    Packs/day: 0.50    Years: 7.00    Pack years: 3.50    Types: Cigarettes     Last attempt to quit: 05/24/2012    Years since quitting: 8.6  . Smokeless tobacco: Never Used  Vaping Use  . Vaping Use: Never used  Substance Use Topics  . Alcohol use: Yes    Comment: 09/13/2015 "I'll drink a couple times/week"  . Drug use: Not Currently    Types: Marijuana    Comment: 09/12/2016 "LAST USE 05/16/12"     Review of Systems  Constitutional: No fever/chills Eyes:  No discharge ENT: No upper respiratory complaints. Respiratory: no cough. No SOB/ use of accessory muscles to breath Gastrointestinal:   No nausea, no vomiting.  No diarrhea.  No constipation. Musculoskeletal: Negative for musculoskeletal pain. Skin: Patient has residual slough of the oral mucosa at the wet dry border.    ____________________________________________   PHYSICAL EXAM:  VITAL SIGNS: ED Triage Vitals  Enc Vitals Group     BP 01/27/21 1040 (!) 139/91     Pulse Rate 01/27/21 1040 (!) 110     Resp 01/27/21 1040 17     Temp 01/27/21 1040 98.7 F (37.1 C)     Temp Source 01/27/21 1040 Oral     SpO2 01/27/21 1040 100 %     Weight --      Height --      Head Circumference --      Peak Flow --      Pain Score 01/27/21 1038 0     Pain Loc --      Pain Edu? --      Excl. in GC? --      Constitutional: Alert and oriented. Well appearing and in no acute distress. Eyes: Conjunctivae are normal. PERRL. EOMI. Head: Atraumatic. ENT:      Nose: No congestion/rhinnorhea.      Mouth/Throat: Mucous membranes are moist. Residual slough at wet dry border visualized.  Neck: No stridor.  No cervical spine tenderness to palpation. Cardiovascular: Normal rate, regular rhythm. Normal S1 and S2.  Good peripheral circulation. Respiratory: Normal respiratory effort without tachypnea or retractions. Lungs CTAB. Good air entry to the bases with no decreased or absent breath sounds Gastrointestinal: Bowel sounds x 4 quadrants. Soft and nontender to palpation. No guarding or rigidity. No  distention. Musculoskeletal: Full range of motion to all extremities. No obvious deformities noted Neurologic:  Normal for age. No gross focal neurologic deficits are appreciated.  Skin:  Skin is warm, dry and intact. No rash noted. Psychiatric: Mood and affect are normal for age. Speech and behavior  are normal.   ____________________________________________   LABS (all labs ordered are listed, but only abnormal results are displayed)  Labs Reviewed - No data to display ____________________________________________  EKG   ____________________________________________  RADIOLOGY   No results found.  ____________________________________________    PROCEDURES  Procedure(s) performed:     Procedures     Medications - No data to display   ____________________________________________   INITIAL IMPRESSION / ASSESSMENT AND PLAN / ED COURSE  Pertinent labs & imaging results that were available during my care of the patient were reviewed by me and considered in my medical decision making (see chart for details).      Assessment and plan:  Sloughing of skin 30 year old female presents to the emergency department with residual slough of the lower lip.  Recommended lanolin ointment at night for increase of hydration and Zyrec and Flonase at night to help with nasal congestion at night before bed.     ____________________________________________  FINAL CLINICAL IMPRESSION(S) / ED DIAGNOSES  Final diagnoses:  Sloughing of skin (HCC)      NEW MEDICATIONS STARTED DURING THIS VISIT:  ED Discharge Orders    None          This chart was dictated using voice recognition software/Dragon. Despite best efforts to proofread, errors can occur which can change the meaning. Any change was purely unintentional.     Orvil Feil, PA-C 01/27/21 1109

## 2021-01-27 NOTE — Discharge Instructions (Signed)
Skin along the oral mucosa will slough away with chapped and dry skin. You can use lanolin ointment at night before bed which will help keep lips moisturized.

## 2021-02-11 DIAGNOSIS — F325 Major depressive disorder, single episode, in full remission: Secondary | ICD-10-CM | POA: Insufficient documentation

## 2021-02-22 ENCOUNTER — Emergency Department (HOSPITAL_COMMUNITY)
Admission: EM | Admit: 2021-02-22 | Discharge: 2021-02-23 | Disposition: A | Discharge status: Home / Self Care | Payer: Medicaid Other | Source: Home / Self Care | Attending: Emergency Medicine | Admitting: Emergency Medicine

## 2021-02-22 ENCOUNTER — Encounter (HOSPITAL_COMMUNITY): Payer: Self-pay

## 2021-02-22 DIAGNOSIS — T450X2A Poisoning by antiallergic and antiemetic drugs, intentional self-harm, initial encounter: Secondary | ICD-10-CM | POA: Insufficient documentation

## 2021-02-22 DIAGNOSIS — F332 Major depressive disorder, recurrent severe without psychotic features: Secondary | ICD-10-CM | POA: Insufficient documentation

## 2021-02-22 DIAGNOSIS — T43622A Poisoning by amphetamines, intentional self-harm, initial encounter: Secondary | ICD-10-CM | POA: Insufficient documentation

## 2021-02-22 DIAGNOSIS — J45909 Unspecified asthma, uncomplicated: Secondary | ICD-10-CM | POA: Insufficient documentation

## 2021-02-22 DIAGNOSIS — F17210 Nicotine dependence, cigarettes, uncomplicated: Secondary | ICD-10-CM | POA: Insufficient documentation

## 2021-02-22 DIAGNOSIS — T50902A Poisoning by unspecified drugs, medicaments and biological substances, intentional self-harm, initial encounter: Secondary | ICD-10-CM

## 2021-02-22 DIAGNOSIS — Z7952 Long term (current) use of systemic steroids: Secondary | ICD-10-CM | POA: Insufficient documentation

## 2021-02-22 DIAGNOSIS — Z20822 Contact with and (suspected) exposure to covid-19: Secondary | ICD-10-CM | POA: Insufficient documentation

## 2021-02-22 DIAGNOSIS — Y906 Blood alcohol level of 120-199 mg/100 ml: Secondary | ICD-10-CM | POA: Insufficient documentation

## 2021-02-22 LAB — COMPREHENSIVE METABOLIC PANEL
ALT: 18 U/L (ref 0–44)
AST: 32 U/L (ref 15–41)
Albumin: 4.4 g/dL (ref 3.5–5.0)
Alkaline Phosphatase: 52 U/L (ref 38–126)
Anion gap: 10 (ref 5–15)
BUN: <5 mg/dL — ABNORMAL LOW (ref 6–20)
CO2: 21 mmol/L — ABNORMAL LOW (ref 22–32)
Calcium: 8.9 mg/dL (ref 8.9–10.3)
Chloride: 108 mmol/L (ref 98–111)
Creatinine, Ser: 0.69 mg/dL (ref 0.44–1.00)
GFR, Estimated: >60 mL/min (ref >60)
Glucose, Bld: 83 mg/dL (ref 70–99)
Potassium: 3.8 mmol/L (ref 3.5–5.1)
Sodium: 139 mmol/L (ref 135–145)
Total Bilirubin: 0.3 mg/dL (ref 0.3–1.2)
Total Protein: 7.9 g/dL (ref 6.5–8.1)

## 2021-02-22 LAB — CBC WITH DIFFERENTIAL/PLATELET
Abs Immature Granulocytes: 0.02 10*3/uL (ref 0.00–0.07)
Basophils Absolute: 0 10*3/uL (ref 0.0–0.1)
Basophils Relative: 0 %
Eosinophils Absolute: 0 10*3/uL (ref 0.0–0.5)
Eosinophils Relative: 1 %
HCT: 34.2 % — ABNORMAL LOW (ref 36.0–46.0)
Hemoglobin: 11.3 g/dL — ABNORMAL LOW (ref 12.0–15.0)
Immature Granulocytes: 0 %
Lymphocytes Relative: 33 %
Lymphs Abs: 2.3 10*3/uL (ref 0.7–4.0)
MCH: 26.8 pg (ref 26.0–34.0)
MCHC: 33 g/dL (ref 30.0–36.0)
MCV: 81.2 fL (ref 80.0–100.0)
Monocytes Absolute: 0.5 10*3/uL (ref 0.1–1.0)
Monocytes Relative: 8 %
Neutro Abs: 3.9 10*3/uL (ref 1.7–7.7)
Neutrophils Relative %: 58 %
Platelets: 222 10*3/uL (ref 150–400)
RBC: 4.21 MIL/uL (ref 3.87–5.11)
RDW: 18.8 % — ABNORMAL HIGH (ref 11.5–15.5)
WBC: 6.8 10*3/uL (ref 4.0–10.5)
nRBC: 0 % (ref 0.0–0.2)

## 2021-02-22 LAB — ACETAMINOPHEN LEVEL: Acetaminophen (Tylenol), Serum: <10 ug/mL — ABNORMAL LOW (ref 10–30)

## 2021-02-22 LAB — RAPID URINE DRUG SCREEN, HOSP PERFORMED
Amphetamines: POSITIVE — AB
Barbiturates: NOT DETECTED
Benzodiazepines: NOT DETECTED
Cocaine: NOT DETECTED
Opiates: NOT DETECTED
Tetrahydrocannabinol: NOT DETECTED

## 2021-02-22 LAB — I-STAT BETA HCG BLOOD, ED (MC, WL, AP ONLY): I-stat hCG, quantitative: <5 m[IU]/mL (ref <5)

## 2021-02-22 LAB — ETHANOL: Alcohol, Ethyl (B): 157 mg/dL — ABNORMAL HIGH (ref <10)

## 2021-02-22 LAB — RESP PANEL BY RT-PCR (FLU A&B, COVID) ARPGX2
Influenza A by PCR: NEGATIVE
Influenza B by PCR: NEGATIVE
SARS Coronavirus 2 by RT PCR: NEGATIVE

## 2021-02-22 LAB — SALICYLATE LEVEL: Salicylate Lvl: <7 mg/dL — ABNORMAL LOW (ref 7.0–30.0)

## 2021-02-22 MED ORDER — THIAMINE HCL 100 MG/ML IJ SOLN: 100.0000 mg | Freq: Every day | INTRAMUSCULAR | Status: DC

## 2021-02-22 MED ORDER — LORAZEPAM 1 MG PO TABS
0.0000 mg | ORAL_TABLET | Freq: Four times a day (QID) | ORAL | Status: DC
  Administered 2021-02-22: 1 mg via ORAL
  Filled 2021-02-22: qty 1

## 2021-02-22 MED ORDER — THIAMINE HCL 100 MG PO TABS
100.0000 mg | ORAL_TABLET | Freq: Every day | ORAL | Status: DC
  Administered 2021-02-22: 100 mg via ORAL
  Filled 2021-02-22: qty 1

## 2021-02-22 MED ORDER — LORAZEPAM 2 MG/ML IJ SOLN: 0.0000 mg | Freq: Four times a day (QID) | INTRAMUSCULAR | Status: DC

## 2021-02-22 MED ORDER — LORAZEPAM 2 MG/ML IJ SOLN: 0.0000 mg | Freq: Two times a day (BID) | INTRAMUSCULAR | Status: DC

## 2021-02-22 MED ORDER — ACETAMINOPHEN 500 MG PO TABS: 500.0000 mg | ORAL_TABLET | Freq: Four times a day (QID) | ORAL | Status: DC | PRN

## 2021-02-22 MED ORDER — FLUTICASONE PROPIONATE 50 MCG/ACT NA SUSP: 2.0000 | Freq: Every day | NASAL | Status: DC

## 2021-02-22 MED ORDER — LORAZEPAM 1 MG PO TABS: 0.0000 mg | ORAL_TABLET | Freq: Two times a day (BID) | ORAL | Status: DC

## 2021-02-22 NOTE — ED Provider Notes (Signed)
[facility]-EMERGENCY DEPT Provider Note   CSN: [id number] Arrival date & time: 02/22/21  1149     History Chief Complaint  Patient presents with   Drug Overdose    Lindsey Castillo is a 30 y.o. female.  30 year old female presents with suicidal ideations and attempt.  Patient states she took an unknown amount of Adderall, hydroxyzine, and cetirizine about an hour ago.  States that she had emesis afterwards.  Denies any other coingestions at this time.  Drinks alcohol daily he did drink today.  Denies any other illicit drug use.  Has history of anxiety depression states that those symptoms have become worse.  Denies any prior history of suicide attempt.     Past Medical History:  Diagnosis Date   Abnormal Pap smear    last pap 07/2012   Allergy    Anemia    Anxiety    Asthma    USES INHALER   Depression    Eczema    Fracture, ankle AGE 27   Headache(784.0)    Infection    trich   Infection    BV   Infection    HX OF FREQUENT   NSVD (normal spontaneous vaginal delivery) 01/01/2013   SVD at 1525   Seasonal allergies     Patient Active Problem List   Diagnosis Date Noted   Rhabdomyolysis 04/03/2017   Anemia 04/03/2017   Tobacco abuse 04/03/2017   Vaginal discharge 04/03/2017   Gonorrhea in female 09/14/2016   Colitis, acute 09/14/2016   SIRS (systemic inflammatory response syndrome) (HCC) 09/12/2016   Right lower quadrant abdominal pain 09/12/2016   NSVD (normal spontaneous vaginal delivery) 01/01/2013   Asthma 08/02/2012   Eczema 08/02/2012    Past Surgical History:  Procedure Laterality Date   FOOT SURGERY Right    "15 splinters in my foot"     OB History     Gravida  1   Para  1   Term  1   Preterm      AB      Living  1      SAB      IAB      Ectopic      Multiple      Live Births  1           Family History  Problem Relation Age of Onset   Hypertension Father    Heart disease Father    Thyroid  disease Father    Alcoholism Father    Asthma Father    Cancer Maternal Grandmother    Diabetes Maternal Grandmother    Kidney disease Maternal Grandmother    Arthritis Maternal Grandmother    Hypertension Maternal Grandmother    Depression Mother    Mental illness Mother    Birth defects Sister        HEART MURMUR   Asthma Brother    Miscarriages / [place] Cousin    Thyroid disease Sister     Social History   Tobacco Use   Smoking status: Every Day    Packs/day: 0.50    Years: 7.00    Pack years: 3.50    Types: Cigarettes    Last attempt to quit: 05/24/2012    Years since quitting: 8.7   Smokeless tobacco: Never  Vaping Use   Vaping Use: Never used  Substance Use Topics   Alcohol use: Yes    Comment: 09/13/2015 "I'll drink a couple times/week"   Drug use: Not  Currently    Types: Marijuana    Comment: 09/12/2016 "LAST USE 05/16/12"    Home Medications Prior to Admission medications   Medication Sig Start Date End Date Taking? Authorizing Provider  acetaminophen (TYLENOL) 500 MG tablet Take 500 mg by mouth every 6 (six) hours as needed.    [provider]  amphetamine-dextroamphetamine (ADDERALL) 15 MG tablet Take 15 mg by mouth daily.    [provider]  azithromycin (ZITHROMAX) 250 MG tablet Zpack instructions. 500mg  day 1and 250mg  each day after until gone 01/30/20   A, PA-C  cetirizine (ZYRTEC) 5 MG tablet Take 5 mg by mouth daily.    [provider]  escitalopram (LEXAPRO) 10 MG tablet Take 10 mg by mouth daily.    [provider]  fluticasone (FLONASE) 50 MCG/ACT nasal spray Place 2 sprays into both nostrils daily. 12/02/20   Ronnie Doss, PA-C  hydrOXYzine (ATARAX/VISTARIL) 10 MG tablet Take 10 mg by mouth 3 (three) times daily as needed.    [provider]  meclizine (ANTIVERT) 25 MG tablet Take 1 tablet (25 mg total) by mouth 3 (three) times daily as needed for dizziness. 12/02/20   Rushie Chestnut,  PA-C    Allergies    Other  Review of Systems   Review of Systems  All other systems reviewed and are negative.  Physical Exam Updated Vital Signs BP 124/90 (BP Location: Right Arm)   Pulse (!) 107   Temp 97.8 F (36.6 C) (Oral)   Resp 14   Ht 1.702 m (5\' 7" )   Wt 68 kg   LMP 01/27/2021   SpO2 99%   BMI 23.49 kg/m   Physical Exam Vitals and nursing note reviewed.  Constitutional:      General: She is not in acute distress.    Appearance: Normal appearance. She is well-developed. She is not toxic-appearing.  HENT:     Head: Normocephalic and atraumatic.  Eyes:     General: Lids are normal.     Conjunctiva/sclera: Conjunctivae normal.     Pupils: Pupils are equal, round, and reactive to light.  Neck:     Thyroid: No thyroid mass.     Trachea: No tracheal deviation.  Cardiovascular:     Rate and Rhythm: Normal rate and regular rhythm.     Heart sounds: Normal heart sounds. No murmur heard.   No gallop.  Pulmonary:     Effort: Pulmonary effort is normal. No respiratory distress.     Breath sounds: Normal breath sounds. No stridor. No decreased breath sounds, wheezing, rhonchi or rales.  Abdominal:     General: There is no distension.     Palpations: Abdomen is soft.     Tenderness: There is no abdominal tenderness. There is no rebound.  Musculoskeletal:        General: No tenderness. Normal range of motion.     Cervical back: Normal range of motion and neck supple.  Skin:    General: Skin is warm and dry.     Findings: No abrasion or rash.  Neurological:     Mental Status: She is alert and oriented to person, place, and time. Mental status is at baseline.     GCS: GCS eye subscore is 4. GCS verbal subscore is 5. GCS motor subscore is 6.     Cranial Nerves: Cranial nerves are intact. No cranial nerve deficit.     Sensory: No sensory deficit.     Motor: Motor function is  intact.  Psychiatric:        Attention and Perception: Attention normal.        Speech:  Speech normal.        Behavior: Behavior normal.        Thought Content: Thought content includes suicidal ideation. Thought content includes suicidal plan.    ED Results / Procedures / Treatments   Labs (all labs ordered are listed, but only abnormal results are displayed) Labs Reviewed  RAPID URINE DRUG SCREEN, HOSP PERFORMED  ETHANOL  SALICYLATE LEVEL  ACETAMINOPHEN LEVEL  CBC WITH DIFFERENTIAL/PLATELET  COMPREHENSIVE METABOLIC PANEL  I-STAT BETA HCG BLOOD, ED ([facility], [facility], [facility] ONLY)    EKG None  Radiology No results found.  Procedures Procedures   Medications Ordered in ED Medications - No data to display  ED Course  I have reviewed the triage vital signs and the nursing notes.  Pertinent labs & imaging results that were available during my care of the patient were reviewed by me and considered in my medical decision making (see chart for details).    MDM Rules/Calculators/A&P                         Patient will be medically clear for psychiatric disposition Final Clinical Impression(s) / ED Diagnoses Final diagnoses:  None    Rx / DC Orders ED Discharge Orders     None        Lorre Nick, MD 02/22/21 1303

## 2021-02-22 NOTE — ED Notes (Signed)
Pt to room 30. Pt alert, cooperative, no s/s of distress. Pt oriented to unit and room.  °

## 2021-02-22 NOTE — ED Notes (Signed)
Safe transport called to transfer pt to [facility]   

## 2021-02-22 NOTE — ED Notes (Signed)
Pt seen and wand by security.  Pt's belongings in cabinet in 9-12 section.

## 2021-02-22 NOTE — ED Triage Notes (Signed)
Per EMS- patient is from home. Patient took an unknown amount of Cetrizine 10 mg, AdderallXR 15 mg and Hydroxyzine 50 mg. Patient states she is suicidal.

## 2021-02-22 NOTE — BH Assessment (Signed)
Comprehensive Clinical Assessment (CCA) Note  02/22/2021 Lindsey Castillo [id number] DISPOSITION: Patient per Clementeen Hoof NP meets inpatient criteria and has been accepted to 302-1 at 2000 hours this date.   Flowsheet Row ED from 02/22/2021 in [facility] from 01/27/2021 in [facility] RISK CATEGORY High Risk No Risk      The patient demonstrates the following risk factors for suicide: Chronic risk factors for suicide include: psychiatric disorder of depression . Acute risk factors for suicide include: family or marital conflict. Protective factors for this patient include: responsibility to others (children, family). Considering these factors, the overall suicide risk at this point appears to be high. Patient is not appropriate for outpatient follow up.   Patient presents this date with ongoing S/I after ingesting a unknown amount of Adderall, Vistaril and Cetrizine prior to arrival. Patient reports this was a attempt to end her life. Patient denies any H/I or AVH. Patient states she has been stressed over the last three months over ongoing finical hardship, family and personal relationship issues. Patient states "it's just everything" and "I don't know how to deal with it all." Patient denies any previous attempts or gestures to self harm or history of inpatient admissions associated with mental health. Patient reports she has been diagnosed with depression and currently receives medication management from [facility]. Patient states she has been taking medications as directed until one week ago when she discontinued them because "she just gave up." Patient also reports she has been seeing a therapist at that location weekly also although has not meet with her counselor in over a month due to lack of motivation. Patient currently shares her home with her grandmother and her 39 year old son although states that her sister    recently moved in within the last month which has also been a stressor. 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 Patient reports daily alcohol use for the last year stating she consumes 2 to 6 beers daily with last use prior to arrival when she "had a few beers." Patient's BAL was 157 on arrival. Patient denies any other SA use. UDS is positive for amphetamines although patient is prescribed Adderall which was one of the medications she ingested prior to arrival. Patient denies any withdrawals or history of abuse. Patient denies access to firearms. Patient denies any current legal. Patient states soon after ingestion her sister found her and contacted EMS to transport to hospital. Patient is requesting a voluntary admission.    Per notes on arrival [place] MD notes: 30 year old female presents with suicidal ideations and attempt. Patient states she took an unknown amount of Adderall, hydroxyzine, and cetirizine about an hour ago. States that she had emesis afterwards. Denies any other coingestions at this time. Drinks alcohol daily he did drink today. Denies any other illicit drug use.  Has history of anxiety depression states that those symptoms have become worse.  Denies any prior history of suicide attempt.    Patient is alert and oriented  x 5. Patient presents with a depressed affect with mood congruent. Patient's memory is intact with thoughts organized. Patient does not appear to be responding to internal stimuli.    Chief Complaint:  Chief Complaint  Patient presents with   Drug Overdose   Visit Diagnosis: MDD recurrent without psychotic features, severe, Alcohol abuse     CCA Screening, Triage and Referral (STR)  Patient Reported Information How  did you hear about [place]? Self  What Is the Reason for Your Visit/Call Today? Ongoing S/I patient ingested multiple medications this date prior to arrival.  How Long Has This Been Causing You Problems? 1 wk - 1  month  What Do You Feel Would Help You the Most Today? Stress Management   Have You Recently Had Any Thoughts About Hurting Yourself? Yes  Are You Planning to Commit Suicide/Harm Yourself At This time? Yes   Have you Recently Had Thoughts About Hurting Someone Lindsey Castillo? No  Are You Planning to Harm Someone at This Time? No  Explanation: No data recorded  Have You Used Any Alcohol or Drugs in the Past 24 Hours? Yes  How Long Ago Did You Use Drugs or Alcohol? No data recorded What Did You Use and How Much? Bal 157 on arrival   Do You Currently Have a Therapist/Psychiatrist? Yes  Name of Therapist/Psychiatrist: [facility]   Have You Been Recently Discharged From Any Office Practice or Programs? No  Explanation of Discharge From Practice/Program: No data recorded    CCA Screening Triage Referral Assessment Type of Contact: Face-to-Face  Telemedicine Service Delivery:   Is this Initial or Reassessment? No data recorded Date Telepsych consult ordered in [facility]:  No data recorded Time Telepsych consult ordered in [facility]:  No data recorded Location of Assessment: [facility]  Provider Location: [facility]   Collateral Involvement: None at this time   Does Patient Have a Court Appointed Legal Guardian? No data recorded Name and Contact of Legal Guardian: No data recorded If Minor and Not Living with Parent(s), Who has Custody? No data recorded Is CPS involved or ever been involved? Never  Is APS involved or ever been involved? Never   Patient Determined To Be At Risk for Harm To Self or Others Based on Review of Patient Reported Information or Presenting Complaint? Yes, for Self-Harm  Method: No data recorded Availability of Means: No data recorded Intent: No data recorded Notification Required: No data recorded Additional Information for Danger to Others Potential: No data recorded Additional Comments for Danger to Others Potential: No data  recorded Are There Guns or Other Weapons in Your Home? No data recorded Types of Guns/Weapons: No data recorded Are These Weapons Safely Secured?                            No data recorded Who Could Verify You Are Able To Have These Secured: No data recorded Do You Have any Outstanding Charges, Pending Court Dates, Parole/Probation? No data recorded Contacted To Inform of Risk of Harm To Self or Others: Other: Comment (NA)    Does Patient Present under Involuntary Commitment? No  IVC Papers Initial File Date: No data recorded  [place] of Residence: [place]   Patient Currently Receiving the Following Services: Medication Management   Determination of Need: Emergent (2 hours)   Options For Referral: Outpatient Therapy     CCA Biopsychosocial Patient Reported Schizophrenia/Schizoaffective Diagnosis in Past: No   Strengths: No data recorded  Mental Health Symptoms Depression:   Change in energy/activity   Duration of Depressive symptoms:  Duration of Depressive Symptoms: Greater than two weeks   Mania:   None   Anxiety:    Difficulty concentrating   Psychosis:   None   Duration of Psychotic symptoms:    Trauma:   None   Obsessions:   None   Compulsions:   None  Inattention:   None   Hyperactivity/Impulsivity:   None   Oppositional/Defiant Behaviors:   None   Emotional Irregularity:   Chronic feelings of emptiness   Other Mood/Personality Symptoms:  No data recorded   Mental Status Exam Appearance and self-care  Stature:   Average   Weight:   Average weight   Clothing:   Neat/clean   Grooming:   Normal   Cosmetic use:   Age appropriate   Posture/gait:   Normal   Motor activity:   Not Remarkable   Sensorium  Attention:   Normal   Concentration:   Normal   Orientation:   X5   Recall/memory:   Normal   Affect and Mood  Affect:   Anxious   Mood:   Anxious; Depressed   Relating  Eye contact:   Normal    Facial expression:   Anxious   Attitude toward examiner:   Cooperative   Thought and Language  Speech flow:  Clear and Coherent   Thought content:   Appropriate to Mood and Circumstances   Preoccupation:   None   Hallucinations:   None   Organization:  No data recorded  Affiliated Computer ServicesExecutive Functions  Fund of Knowledge:   Good   Intelligence:   Average   Abstraction:   Normal   Judgement:   Poor   Reality Testing:   Realistic   Insight:   Good   Decision Making:   Normal   Social Functioning  Social Maturity:   Responsible   Social Judgement:   Normal   Stress  Stressors:   Family conflict; Financial; Relationship   Coping Ability:   Overwhelmed   Skill Deficits:   Activities of daily living   Supports:   Usual     Religion: Religion/Spirituality Are You A Religious Person?: No  Leisure/Recreation: Leisure / Recreation Do You Have Hobbies?: No  Exercise/Diet: Exercise/Diet Do You Exercise?: No Have You Gained or Lost A Significant Amount of Weight in the Past Six Months?: No Do You Follow a Special Diet?: No Do You Have Any Trouble Sleeping?: No   CCA Employment/Education Employment/Work Situation: Employment / Work Situation Employment Situation: Employed Work Stressors: Teacher, musicfinancial Patient's Job has Been Impacted by Current Illness: No Has Patient ever Been in Equities traderthe Military?: No  Education:     CCA Family/Childhood History Family and Relationship History: Family history Marital status: Long term relationship Long term relationship, how long?: 2 years What types of issues is patient dealing with in the relationship?: Ongoing stress with lack of communication Does patient have children?: Yes How many children?: 1 How is patient's relationship with their children?: Good relationship  Childhood History:  Childhood History By whom was/is the patient raised?: Both parents Did patient suffer any verbal/emotional/physical/sexual  abuse as a child?: No Did patient suffer from severe childhood neglect?: No Has patient ever been sexually abused/assaulted/raped as an adolescent or adult?: No Was the patient ever a victim of a crime or a disaster?: No Witnessed domestic violence?: No Has patient been affected by domestic violence as an adult?: No  Child/Adolescent Assessment:     CCA Substance Use Alcohol/Drug Use: Alcohol / Drug Use Pain Medications: See MAR Prescriptions: See MAR Over the Counter: See MAR History of alcohol / drug use?: Yes Substance #1 Name of Substance 1: Alcohol 1 - Age of First Use: 20 1 - Amount (size/oz): Varies 1 - Frequency: Varies 1 - Duration: Ongoing 1 - Last Use / Amount: Prior to arrival  ASAM's:  Six Dimensions of Multidimensional Assessment  Dimension 1:  Acute Intoxication and/or Withdrawal Potential:   Dimension 1:  Description of individual's past and current experiences of substance use and withdrawal: 1  Dimension 2:  Biomedical Conditions and Complications:   Dimension 2:  Description of patient's biomedical conditions and  complications: 2  Dimension 3:  Emotional, Behavioral, or Cognitive Conditions and Complications:  Dimension 3:  Description of emotional, behavioral, or cognitive conditions and complications: 1  Dimension 4:  Readiness to Change:  Dimension 4:  Description of Readiness to Change criteria: 1  Dimension 5:  Relapse, Continued use, or Continued Problem Potential:  Dimension 5:  Relapse, continued use, or continued problem potential critiera description: 2  Dimension 6:  Recovery/Living Environment:  Dimension 6:  Recovery/Iiving environment criteria description: 2  ASAM Severity Score: ASAM's Severity Rating Score: 9  ASAM Recommended Level of Treatment:     Substance use Disorder (SUD) Substance Use Disorder (SUD)  Checklist Symptoms of Substance Use: Continued use despite having a persistent/recurrent  physical/psychological problem caused/exacerbated by use  Recommendations for Services/Supports/Treatments:    Discharge Disposition:    DSM5 Diagnoses: Patient Active Problem List   Diagnosis Date Noted   Rhabdomyolysis 04/03/2017   Anemia 04/03/2017   Tobacco abuse 04/03/2017   Vaginal discharge 04/03/2017   Gonorrhea in female 09/14/2016   Colitis, acute 09/14/2016   SIRS (systemic inflammatory response syndrome) (HCC) 09/12/2016   Right lower quadrant abdominal pain 09/12/2016   NSVD (normal spontaneous vaginal delivery) 01/01/2013   Asthma 08/02/2012   Eczema 08/02/2012     Referrals to Alternative Service(s): Referred to Alternative Service(s):   Place:   Date:   Time:    Referred to Alternative Service(s):   Place:   Date:   Time:    Referred to Alternative Service(s):   Place:   Date:   Time:    Referred to Alternative Service(s):   Place:   Date:   Time:     Alfredia Ferguson, LCAS

## 2021-02-23 ENCOUNTER — Inpatient Hospital Stay (HOSPITAL_COMMUNITY)
Admission: AD | Admit: 2021-02-23 | Discharge: 2021-02-25 | DRG: 918 | Disposition: A | Discharge status: Home / Self Care | Payer: Medicaid Other | Source: Intra-hospital | Attending: Emergency Medicine | Admitting: Emergency Medicine

## 2021-02-23 ENCOUNTER — Other Ambulatory Visit: Payer: Self-pay

## 2021-02-23 ENCOUNTER — Encounter (HOSPITAL_COMMUNITY): Payer: Self-pay | Admitting: Family Medicine

## 2021-02-23 DIAGNOSIS — K30 Functional dyspepsia: Secondary | ICD-10-CM | POA: Diagnosis present

## 2021-02-23 DIAGNOSIS — T50902A Poisoning by unspecified drugs, medicaments and biological substances, intentional self-harm, initial encounter: Secondary | ICD-10-CM | POA: Diagnosis present

## 2021-02-23 DIAGNOSIS — Z818 Family history of other mental and behavioral disorders: Secondary | ICD-10-CM

## 2021-02-23 DIAGNOSIS — T43592A Poisoning by other antipsychotics and neuroleptics, intentional self-harm, initial encounter: Secondary | ICD-10-CM | POA: Diagnosis present

## 2021-02-23 DIAGNOSIS — F419 Anxiety disorder, unspecified: Secondary | ICD-10-CM | POA: Diagnosis present

## 2021-02-23 DIAGNOSIS — Z20822 Contact with and (suspected) exposure to covid-19: Secondary | ICD-10-CM | POA: Diagnosis present

## 2021-02-23 DIAGNOSIS — T43622A Poisoning by amphetamines, intentional self-harm, initial encounter: Secondary | ICD-10-CM | POA: Diagnosis present

## 2021-02-23 DIAGNOSIS — F17210 Nicotine dependence, cigarettes, uncomplicated: Secondary | ICD-10-CM | POA: Diagnosis present

## 2021-02-23 DIAGNOSIS — G4700 Insomnia, unspecified: Secondary | ICD-10-CM | POA: Diagnosis present

## 2021-02-23 DIAGNOSIS — K5900 Constipation, unspecified: Secondary | ICD-10-CM | POA: Diagnosis present

## 2021-02-23 DIAGNOSIS — F339 Major depressive disorder, recurrent, unspecified: Secondary | ICD-10-CM

## 2021-02-23 DIAGNOSIS — F332 Major depressive disorder, recurrent severe without psychotic features: Secondary | ICD-10-CM | POA: Diagnosis not present

## 2021-02-23 LAB — TSH: TSH: 3.116 u[IU]/mL (ref 0.350–4.500)

## 2021-02-23 MED ORDER — THIAMINE HCL 100 MG/ML IJ SOLN: 100.0000 mg | Freq: Once | INTRAMUSCULAR | Status: DC

## 2021-02-23 MED ORDER — LOPERAMIDE HCL 2 MG PO CAPS: 2.0000 mg | ORAL_CAPSULE | ORAL | Status: DC | PRN

## 2021-02-23 MED ORDER — ADULT MULTIVITAMIN W/MINERALS CH
1.0000 | ORAL_TABLET | Freq: Every day | ORAL | Status: DC
  Administered 2021-02-23 – 2021-02-25 (×3): 1 via ORAL
  Filled 2021-02-23 (×5): qty 1

## 2021-02-23 MED ORDER — LORAZEPAM 1 MG PO TABS: 1.0000 mg | ORAL_TABLET | Freq: Four times a day (QID) | ORAL | Status: DC | PRN

## 2021-02-23 MED ORDER — ONDANSETRON 4 MG PO TBDP
4.0000 mg | ORAL_TABLET | Freq: Four times a day (QID) | ORAL | Status: DC | PRN
  Administered 2021-02-23: 4 mg via ORAL
  Filled 2021-02-23: qty 1

## 2021-02-23 MED ORDER — ACETAMINOPHEN 325 MG PO TABS: 650.0000 mg | ORAL_TABLET | Freq: Four times a day (QID) | ORAL | Status: DC | PRN

## 2021-02-23 MED ORDER — HYDROXYZINE HCL 25 MG PO TABS
25.0000 mg | ORAL_TABLET | Freq: Four times a day (QID) | ORAL | Status: DC | PRN
  Administered 2021-02-24 – 2021-02-25 (×3): 25 mg via ORAL
  Filled 2021-02-23 (×3): qty 1

## 2021-02-23 MED ORDER — ESCITALOPRAM OXALATE 20 MG PO TABS
20.0000 mg | ORAL_TABLET | Freq: Every day | ORAL | Status: DC
  Administered 2021-02-23 – 2021-02-25 (×3): 20 mg via ORAL
  Filled 2021-02-23 (×5): qty 1

## 2021-02-23 MED ORDER — FERROUS SULFATE 325 (65 FE) MG PO TABS
325.0000 mg | ORAL_TABLET | Freq: Two times a day (BID) | ORAL | Status: DC
  Administered 2021-02-23 – 2021-02-25 (×4): 325 mg via ORAL
  Filled 2021-02-23 (×8): qty 1

## 2021-02-23 MED ORDER — LORAZEPAM 1 MG PO TABS
1.0000 mg | ORAL_TABLET | Freq: Four times a day (QID) | ORAL | Status: DC | PRN
  Administered 2021-02-23 (×2): 1 mg via ORAL
  Filled 2021-02-23 (×2): qty 1

## 2021-02-23 MED ORDER — THIAMINE HCL 100 MG PO TABS
100.0000 mg | ORAL_TABLET | Freq: Every day | ORAL | Status: DC
  Administered 2021-02-23 – 2021-02-25 (×3): 100 mg via ORAL
  Filled 2021-02-23 (×5): qty 1

## 2021-02-23 MED ORDER — MAGNESIUM HYDROXIDE 400 MG/5ML PO SUSP: 30.0000 mL | Freq: Every day | ORAL | Status: DC | PRN

## 2021-02-23 MED ORDER — NICOTINE 21 MG/24HR TD PT24
21.0000 mg | MEDICATED_PATCH | Freq: Every day | TRANSDERMAL | Status: DC
  Administered 2021-02-23 – 2021-02-25 (×3): 21 mg via TRANSDERMAL
  Filled 2021-02-23 (×5): qty 1

## 2021-02-23 MED ORDER — ALUM & MAG HYDROXIDE-SIMETH 200-200-20 MG/5ML PO SUSP: 30.0000 mL | ORAL | Status: DC | PRN

## 2021-02-23 NOTE — Progress Notes (Signed)
D:  Patient denied SI and HI, contracts for safety.  Denied A/V hallucinations.  Denied pain. A:  Medications administered per MD orders.  Emotional support and encouragement given patient. R:  Safety maintained with 15 minute checks.  

## 2021-02-23 NOTE — Tx Team (Signed)
Initial Treatment Plan 02/23/2021 2:41 AM Lindsey Castillo [id number]    PATIENT STRESSORS: Financial difficulties Marital or family conflict Occupational concerns Substance abuse   PATIENT STRENGTHS: Ability for insight Average or above average intelligence Communication skills Motivation for treatment/growth Physical Health Supportive family/friends   PATIENT IDENTIFIED PROBLEMS: Suicide attempt -  OD on prescription Adderall, cetirizine and hydroxyzine  Anxiety  EtOH use d/o  Tobacco use d/o  (Wants to work on Pharmacologist, finding a therapist, smoking and alcohol use cessation tools)             DISCHARGE CRITERIA:  Improved stabilization in mood, thinking, and/or behavior Motivation to continue treatment in a less acute level of care Verbal commitment to aftercare and medication compliance Withdrawal symptoms are absent or subacute and managed without 24-hour nursing intervention  PRELIMINARY DISCHARGE PLAN: Attend aftercare/continuing care group Outpatient therapy Return to previous living arrangement Return to previous work or school arrangements  PATIENT/FAMILY INVOLVEMENT: This treatment plan has been presented to and reviewed with the patient, Lindsey Castillo, and/or family member.  The patient and family have been given the opportunity to ask questions and make suggestions.  Victorino December, RN 02/23/2021, 2:41 AM

## 2021-02-23 NOTE — Plan of Care (Signed)
Nurse discussed anxiety, depression and coping skills with patient.  

## 2021-02-23 NOTE — Progress Notes (Signed)
Adult Psychoeducational Group Note  Date:  02/23/2021 Time:  2:19 PM  Group Topic/Focus:  Goals Group:   The focus of this group is to help patients establish daily goals to achieve during treatment and discuss how the patient can incorporate goal setting into their daily lives to aide in recovery.  Participation Level:  Active  Participation Quality:  Appropriate and Attentive  Affect:  Appropriate  Cognitive:  Alert and Appropriate  Insight: Appropriate and Good  Engagement in Group:  Engaged  Modes of Intervention:  Discussion  Additional Comments:  Pt attended morning goals group. Pt's goal is to work on using "I" statements and slow down when she is getting worked up or overwhelmed.   Lindsey Castillo 02/23/2021, 2:19 PM
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
## 2021-02-23 NOTE — Progress Notes (Addendum)
   02/23/21 0000  Psych Admission Type (Psych Patients Only)  Admission Status Voluntary  Psychosocial Assessment  Patient Complaints Anxiety;Depression;Helplessness;Hopelessness  Eye Contact Fair  Facial Expression Anxious  Affect Anxious  Speech Logical/coherent  Interaction Assertive  Motor Activity Other (Comment) (wnl)  Appearance/Hygiene Unremarkable;In scrubs  Behavior Characteristics Cooperative;Anxious  Mood Anxious;Pleasant  Thought Process  Coherency WDL  Content Blaming self  Delusions None reported or observed  Perception WDL  Hallucination None reported or observed  Judgment WDL  Confusion None  Danger to Self  Current suicidal ideation? Denies  Description of Suicide Plan Pt tried to OD on prescription Adderall, cetirizine, hydroxyzine  Self-Injurious Behavior No self-injurious ideation or behavior indicators observed or expressed   Agreement Not to Harm Self Yes  Description of Agreement verbal agreement to approach staff  Danger to Others  Danger to Others None reported or observed   Pt reports she has never detoxed from alcohol before. Pt educated on some of the withdrawal symptoms and the importance of reporting them to nursing staff when they occur. Pt verbalized understanding.  Pt says her Lexapro dose was recently increased because it was not helping her symptoms.  Pt HR is elevated (139 bpm) but may be d/t her overdose of Adderall.  Manual pulse was lower at 108 bpm. Pt c/o nausea, anxiety 10/10, and tremors. Pt given PRNs as appropriate.

## 2021-02-23 NOTE — Progress Notes (Signed)
Recreation Therapy Notes  Animal-Assisted Activity (AAA) Program Checklist/Progress Notes Patient Eligibility Criteria Checklist & Daily Group note for Rec Tx Intervention  Date: 6.14.22 Time: 1430 Location: [address]   AAA/T Program Assumption of Risk Form signed by Engineer, production or Parent Legal Guardian YES  Patient is free of allergies or severe asthma YES   Patient reports no fear of animals YES   Patient reports no history of cruelty to animals YES  Patient understands his/her participation is voluntary YES  Patient washes hands before animal contact YES  Patient washes hands after animal contact YES   Education: Charity fundraiser, Appropriate Animal Interaction   Education Outcome: Acknowledges understanding/In group clarification offered/Needs additional education.   Clinical Observations/Feedback: Pt did not attend activity.      Caroll Rancher, LRT/CTRS        Caroll Rancher A 02/23/2021 3:31 PM
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
## 2021-02-23 NOTE — H&P (Signed)
Psychiatric Admission Assessment Adult  Patient Identification: Lindsey Castillo MRN:  [id number] Date of Evaluation:  02/23/2021 Chief Complaint:  Suicidal overdose (HCC) [T50.902A] Principal Diagnosis: <principal problem not specified> Diagnosis:  Active Problems:   Suicidal overdose (HCC)  History of Present Illness: Patient is seen and examined.  Patient is a 30 year old female with a past psychiatric history significant for attention deficit hyperactivity disorder and alcohol dependence who presented to the [facility] emergency department on 02/22/2021 after an intentional overdose of Zyrtec, Adderall XR and hydroxyzine.  She admitted to suicidality at that time.  Patient stated that she is going to college, and is a Environmental consultant, and did not start drinking and till approximately 2 years ago.  She has been drinking excessively since then.  She feels quite ashamed and guilty about that.  She stated she would not of overdose had she not been intoxicated.  She was admitted to the hospital for evaluation and stabilization.  Associated Signs/Symptoms: Depression Symptoms:  depressed mood, anhedonia, insomnia, psychomotor retardation, fatigue, feelings of worthlessness/guilt, difficulty concentrating, hopelessness, suicidal attempt, anxiety, loss of energy/fatigue, disturbed sleep, Duration of Depression Symptoms: Greater than two weeks  (Hypo) Manic Symptoms:  Impulsivity, Irritable Mood, Labiality of Mood, Anxiety Symptoms:  Excessive Worry, Psychotic Symptoms:   Denied PTSD Symptoms: Had a traumatic exposure:    In the past Total Time spent with patient: 30 minutes  Past Psychiatric History: Patient denied any previous psychiatric admissions.  She has been treated for attention deficit disorder in the past as well as anxiety.  Is the patient at risk to self? Yes.    Has the patient been a risk to self in the past 6 months? No.  Has the patient been a risk  to self within the distant past? No.  Is the patient a risk to others? No.  Has the patient been a risk to others in the past 6 months? No.  Has the patient been a risk to others within the distant past? No.   Prior Inpatient Therapy:   Prior Outpatient Therapy:    Alcohol Screening: 1. How often do you have a drink containing alcohol?: 4 or more times a week (drinks 7 days a week) 2. How many drinks containing alcohol do you have on a typical day when you are drinking?: 7, 8, or 9 3. How often do you have six or more drinks on one occasion?: Daily or almost daily AUDIT-C Score: 11 4. How often during the last year have you found that you were not able to stop drinking once you had started?: Daily or almost daily 5. How often during the last year have you failed to do what was normally expected from you because of drinking?: Weekly 6. How often during the last year have you needed a first drink in the morning to get yourself going after a heavy drinking session?: Never 7. How often during the last year have you had a feeling of guilt of remorse after drinking?: Weekly 8. How often during the last year have you been unable to remember what happened the night before because you had been drinking?: Never 9. Have you or someone else been injured as a result of your drinking?: No 10. Has a relative or friend or a doctor or another health worker been concerned about your drinking or suggested you cut down?: Yes, during the last year Alcohol Use Disorder Identification Test Final Score (AUDIT): 25 Alcohol Brief Interventions/Follow-up: Alcohol education/Brief advice Substance Abuse  History in the last 12 months:  Yes.   Consequences of Substance Abuse: Admission to the hospital.  Suicide attempt. Previous Psychotropic Medications: Yes  Psychological Evaluations:  Denied Past Medical History:  Past Medical History:  Diagnosis Date   Abnormal Pap smear    last pap 07/2012   Allergy    Anemia     Anxiety    Asthma    USES INHALER   Depression    Eczema    Fracture, ankle AGE 27   Headache(784.0)    Infection    trich   Infection    BV   Infection    HX OF FREQUENT   NSVD (normal spontaneous vaginal delivery) 01/01/2013   SVD at 1525   Seasonal allergies     Past Surgical History:  Procedure Laterality Date   FOOT SURGERY Right    "15 splinters in my foot"   Family History:  Family History  Problem Relation Age of Onset   Hypertension Father    Heart disease Father    Thyroid disease Father    Alcoholism Father    Asthma Father    Cancer Maternal Grandmother    Diabetes Maternal Grandmother    Kidney disease Maternal Grandmother    Arthritis Maternal Grandmother    Hypertension Maternal Grandmother    Depression Mother    Mental illness Mother    Birth defects Sister        HEART MURMUR   Asthma Brother    Miscarriages / [place] Cousin    Thyroid disease Sister    Family Psychiatric  History: Patient stated mother and father both had issues with depression and alcohol.  Tobacco Screening: Have you used any form of tobacco in the last 30 days? (Cigarettes, Smokeless Tobacco, Cigars, and/or Pipes): Yes Tobacco use, Select all that apply: 5 or more cigarettes per day Are you interested in Tobacco Cessation Medications?: Yes, will notify MD for an order Counseled patient on smoking cessation including recognizing danger situations, developing coping skills and basic information about quitting provided: Yes Social History:  Social History   Substance and Sexual Activity  Alcohol Use Yes   Alcohol/week: 56.0 standard drinks   Types: 56 Cans of beer per week   Comment: drinks (4) 24-oz beers a day     Social History   Substance and Sexual Activity  Drug Use Not Currently    Additional Social History: Marital status: Long term relationship Long term relationship, how long?: 3 years What types of issues is patient dealing with in the  relationship?: Ongoing stress with lack of communication Are you sexually active?: No What is your sexual orientation?: Heterosexual Does patient have children?: Yes How many children?: 1 How is patient's relationship with their children?: 24 yo son; Close with son.                         Allergies:   Allergies  Allergen Reactions   Other     Coconut   Lab Results:  Results for orders placed or performed during the hospital encounter of 02/22/21 (from the past 48 hour(s))  Rapid urine drug screen (hospital performed)     Status: Abnormal   Collection Time: 02/22/21 12:38 PM  Result Value Ref Range   Opiates NONE DETECTED NONE DETECTED   Cocaine NONE DETECTED NONE DETECTED   Benzodiazepines NONE DETECTED NONE DETECTED   Amphetamines POSITIVE (A) NONE DETECTED   Tetrahydrocannabinol NONE DETECTED NONE DETECTED  Barbiturates NONE DETECTED NONE DETECTED    Comment: (NOTE) DRUG SCREEN FOR MEDICAL PURPOSES ONLY.  IF CONFIRMATION IS NEEDED FOR ANY PURPOSE, NOTIFY LAB WITHIN 5 DAYS.  LOWEST DETECTABLE LIMITS FOR URINE DRUG SCREEN Drug Class                     Cutoff (ng/mL) Amphetamine and metabolites    1000 Barbiturate and metabolites    200 Benzodiazepine                 200 Tricyclics and metabolites     300 Opiates and metabolites        300 Cocaine and metabolites        300 THC                            50 Performed at [facility], [address] [place], [place] [address]   Ethanol     Status: Abnormal   Collection Time: 02/22/21 12:38 PM  Result Value Ref Range   Alcohol, Ethyl (B) 157 (H) <10 mg/dL    Comment: (NOTE) Lowest detectable limit for serum alcohol is 10 mg/dL.  For medical purposes only. Performed at [facility], [address], [place], [place] [address]   Salicylate level     Status: Abnormal   Collection Time: 02/22/21 12:38 PM  Result Value Ref Range   Salicylate Lvl <7.0 (L) 7.0 - 30.0  mg/dL    Comment: Performed at [facility], [address], [place], [place] [address]  Acetaminophen level     Status: Abnormal   Collection Time: 02/22/21 12:38 PM  Result Value Ref Range   Acetaminophen (Tylenol), Serum <10 (L) 10 - 30 ug/mL    Comment: (NOTE) Therapeutic concentrations vary significantly. A range of 10-30 ug/mL  may be an effective concentration for many patients. However, some  are best treated at concentrations outside of this range. Acetaminophen concentrations >150 ug/mL at 4 hours after ingestion  and >50 ug/mL at 12 hours after ingestion are often associated with  toxic reactions.  Performed at [facility], [address], [place], [place] [address]   CBC with Differential/Platelet     Status: Abnormal   Collection Time: 02/22/21 12:38 PM  Result Value Ref Range   WBC 6.8 4.0 - 10.5 K/uL   RBC 4.21 3.87 - 5.11 MIL/uL   Hemoglobin 11.3 (L) 12.0 - 15.0 g/dL   HCT 29.5 (L) 62.1 - 30.8 %   MCV 81.2 80.0 - 100.0 fL   MCH 26.8 26.0 - 34.0 pg   MCHC 33.0 30.0 - 36.0 g/dL   RDW 65.7 (H) 84.6 - 96.2 %   Platelets 222 150 - 400 K/uL   nRBC 0.0 0.0 - 0.2 %   Neutrophils Relative % 58 %   Neutro Abs 3.9 1.7 - 7.7 K/uL   Lymphocytes Relative 33 %   Lymphs Abs 2.3 0.7 - 4.0 K/uL   Monocytes Relative 8 %   Monocytes Absolute 0.5 0.1 - 1.0 K/uL   Eosinophils Relative 1 %   Eosinophils Absolute 0.0 0.0 - 0.5 K/uL   Basophils Relative 0 %   Basophils Absolute 0.0 0.0 - 0.1 K/uL   Immature Granulocytes 0 %   Abs Immature Granulocytes 0.02 0.00 - 0.07 K/uL    Comment: Performed at [facility], [address], [place], [place] [address]  Comprehensive  metabolic panel     Status: Abnormal   Collection Time: 02/22/21 12:38 PM  Result Value Ref Range   Sodium 139 135 - 145 mmol/L   Potassium 3.8 3.5 - 5.1 mmol/L   Chloride 108 98 - 111 mmol/L   CO2 21 (L) 22 - 32 mmol/L   Glucose, Bld 83 70 - 99 mg/dL     Comment: Glucose reference range applies only to samples taken after fasting for at least 8 hours.   BUN <5 (L) 6 - 20 mg/dL   Creatinine, Ser 1.610.69 0.44 - 1.00 mg/dL   Calcium 8.9 8.9 - 09.610.3 mg/dL   Total Protein 7.9 6.5 - 8.1 g/dL   Albumin 4.4 3.5 - 5.0 g/dL   AST 32 15 - 41 U/L   ALT 18 0 - 44 U/L   Alkaline Phosphatase 52 38 - 126 U/L   Total Bilirubin 0.3 0.3 - 1.2 mg/dL   GFR, Estimated >04>60 >54>60 mL/min    Comment: (NOTE) Calculated using the CKD-EPI Creatinine Equation (2021)    Anion gap 10 5 - 15    Comment: Performed at [facility], [address] [place], [place] [id number]  I-Stat beta hCG blood, ED ([facility], [facility], [facility] only)     Status: None   Collection Time: 02/22/21 12:44 PM  Result Value Ref Range   I-stat hCG, quantitative <5.0 <5 mIU/mL   Comment 3            Comment:   GEST. AGE      CONC.  (mIU/mL)   <=1 WEEK        5 - 50     2 WEEKS       50 - 500     3 WEEKS       100 - 10,000     4 WEEKS     1,000 - 30,000        FEMALE AND NON-PREGNANT FEMALE:     LESS THAN 5 mIU/mL   Resp Panel by RT-PCR (Flu A&B, Covid) Nasopharyngeal Swab     Status: None   Collection Time: 02/22/21  1:29 PM   Specimen: Nasopharyngeal Swab; Nasopharyngeal(NP) swabs in vial transport medium  Result Value Ref Range   SARS Coronavirus 2 by RT PCR NEGATIVE NEGATIVE    Comment: (NOTE) SARS-CoV-2 target nucleic acids are NOT DETECTED.  The SARS-CoV-2 RNA is generally detectable in upper respiratory specimens during the acute phase of infection. The lowest concentration of SARS-CoV-2 viral copies this assay can detect is 138 copies/mL. A negative result does not preclude SARS-Cov-2 infection and should not be used as the sole basis for treatment or other patient management decisions. A negative result may occur with  improper specimen collection/handling, submission of specimen other than nasopharyngeal swab, presence of viral mutation(s) within the areas targeted by this  assay, and inadequate number of viral copies(<138 copies/mL). A negative result must be combined with clinical observations, patient history, and epidemiological information. The expected result is Negative.  Fact Sheet for Patients:  BloggerCourse.comhttps://www.fda.gov/media/152166/download  Fact Sheet for Healthcare Providers:  SeriousBroker.ithttps://www.fda.gov/media/152162/download  This test is no t yet approved or cleared by the [place] FDA and  has been authorized for detection and/or diagnosis of SARS-CoV-2 by FDA under an Emergency Use Authorization (EUA). This EUA will remain  in effect (meaning this test can be used) for the duration of the COVID-19 declaration under Section 564(b)(1) of the Act, 21 U.S.C.section 360bbb-3(b)(1), unless the authorization is terminated  or revoked sooner.       Influenza A by PCR NEGATIVE NEGATIVE   Influenza B by PCR NEGATIVE NEGATIVE    Comment: (NOTE) The Xpert Xpress SARS-CoV-2/FLU/RSV plus assay is intended as an aid in the diagnosis of influenza from Nasopharyngeal swab specimens and should not be used as a sole basis for treatment. Nasal washings and aspirates are unacceptable for Xpert Xpress SARS-CoV-2/FLU/RSV testing.  Fact Sheet for Patients: BloggerCourse.com  Fact Sheet for Healthcare Providers: SeriousBroker.it  This test is not yet approved or cleared by the [place] FDA and has been authorized for detection and/or diagnosis of SARS-CoV-2 by FDA under an Emergency Use Authorization (EUA). This EUA will remain in effect (meaning this test can be used) for the duration of the COVID-19 declaration under Section 564(b)(1) of the Act, 21 U.S.C. section 360bbb-3(b)(1), unless the authorization is terminated or revoked.  Performed at [facility], [address] [place], [place] [address]     Blood Alcohol level:  Lab Results  Component Value Date   ETH 157 (H)  02/22/2021   ETH 105 (H) 12/06/2014    Metabolic Disorder Labs:  Lab Results  Component Value Date   HGBA1C 5.1 11/30/2015   MPG 100 11/30/2015   Lab Results  Component Value Date   PROLACTIN 6.2 11/30/2015   No results found for: CHOL, TRIG, HDL, CHOLHDL, VLDL, LDLCALC  Current Medications: Current Facility-Administered Medications  Medication Dose Route Frequency Provider Last Rate Last Admin   acetaminophen (TYLENOL) tablet 650 mg  650 mg Oral Q6H PRN Novella Olive, NP       alum & mag hydroxide-simeth (MAALOX/MYLANTA) 200-200-20 MG/5ML suspension 30 mL  30 mL Oral Q4H PRN Novella Olive, NP       escitalopram (LEXAPRO) tablet 20 mg  20 mg Oral Daily Antonieta Pert, MD   20 mg at 02/23/21 1100   ferrous sulfate tablet 325 mg  325 mg Oral BID WC Antonieta Pert, MD       hydrOXYzine (ATARAX/VISTARIL) tablet 25 mg  25 mg Oral Q6H PRN Ajibola, Ene A, NP       loperamide (IMODIUM) capsule 2-4 mg  2-4 mg Oral PRN Ajibola, Ene A, NP       LORazepam (ATIVAN) tablet 1 mg  1 mg Oral Q6H PRN Antonieta Pert, MD       magnesium hydroxide (MILK OF MAGNESIA) suspension 30 mL  30 mL Oral Daily PRN Novella Olive, NP       multivitamin with minerals tablet 1 tablet  1 tablet Oral Daily Ajibola, Ene A, NP   1 tablet at 02/23/21 0900   nicotine (NICODERM CQ - dosed in mg/24 hours) patch 21 mg  21 mg Transdermal Daily Ajibola, Ene A, NP   21 mg at 02/23/21 0900   ondansetron (ZOFRAN-ODT) disintegrating tablet 4 mg  4 mg Oral Q6H PRN Ajibola, Ene A, NP   4 mg at 02/23/21 0123   thiamine (B-1) injection 100 mg  100 mg Intramuscular Once Ajibola, Ene A, NP       thiamine tablet 100 mg  100 mg Oral Daily Novella Olive, NP   100 mg at 02/23/21 0900   PTA Medications: Medications Prior to Admission  Medication Sig Dispense Refill Last Dose   albuterol (VENTOLIN HFA) 108 (90 Base) MCG/ACT inhaler Inhale into the lungs every 6 (six) hours as needed for wheezing or shortness of breath.  fluticasone (FLONASE) 50 MCG/ACT nasal spray Place 2 sprays into both nostrils daily. 16 mL 0 Past Week   ADDERALL XR 15 MG 24 hr capsule Take 1 capsule by mouth every morning.      cetirizine (ZYRTEC) 10 MG tablet Take 1 tablet by mouth daily.      escitalopram (LEXAPRO) 20 MG tablet Take 1 tablet by mouth daily.       Musculoskeletal: Strength & Muscle Tone: within normal limits Gait & Station: normal Patient leans: N/A            Psychiatric Specialty Exam:  Presentation  General Appearance: Fairly Groomed  Eye Contact:Fair  Speech:Normal Rate  Speech Volume:Decreased  Handedness:Right   Mood and Affect  Mood:Depressed  Affect:Appropriate   Thought Process  Thought Processes:Coherent  Duration of Psychotic Symptoms: No data recorded Past Diagnosis of Schizophrenia or Psychoactive disorder: No  Descriptions of Associations:Intact  Orientation:Full (Time, Place and Person)  Thought Content:Logical  Hallucinations:Hallucinations: None  Ideas of Reference:None  Suicidal Thoughts:Suicidal Thoughts: No  Homicidal Thoughts:Homicidal Thoughts: No   Sensorium  Memory:Immediate Fair; Recent Fair; Remote Fair  Judgment:Fair  Insight:Fair   Executive Functions  Concentration:Fair  Attention Span:Fair  Recall:Fair  Fund of Knowledge:Fair  Language:Fair   Psychomotor Activity  Psychomotor Activity:Psychomotor Activity: Decreased   Assets  Assets:Desire for Improvement; Financial Resources/Insurance; Housing; Resilience; Social Support   Sleep  Sleep:Sleep: Poor Number of Hours of Sleep: 3.5    Physical Exam: Physical Exam Vitals and nursing note reviewed.  HENT:     Head: Normocephalic and atraumatic.  Pulmonary:     Effort: Pulmonary effort is normal.  Neurological:     General: No focal deficit present.     Mental Status: She is alert and oriented to person, place, and time.   Review of Systems  All other systems  reviewed and are negative. Blood pressure 121/88, pulse (!) 102, temperature 98.3 F (36.8 C), temperature source Oral, height 5\' 7"  (1.702 m), weight 62.6 kg, last menstrual period 01/27/2021, SpO2 100 %. Body mass index is 21.61 kg/m.  Treatment Plan Summary: Daily contact with patient to assess and evaluate symptoms and progress in treatment, Medication management, and Plan patient is seen and examined.  Patient is a 30 year old female with the above-stated past psychiatric history She will be admitted to the hospital.  She will be integrated in the milieu.  She will be encouraged to attend groups.  First fall we will make sure she does not go into alcohol withdrawal.  I will add lorazepam 1 mg p.o. every 6 hours as needed withdrawal symptoms.  She will receive folic acid as well as thiamine.  She had previously taken Lexapro in the past, and we will restart that.  We will start at 20 mg p.o. daily.  Review of her admission laboratories revealed a mildly low bicarb at 21.  The rest of her electrolytes including liver function enzymes were normal.  She has a mild anemia with hemoglobin 11.3 and hematocrit of 34.2.  RDW was normal at 18.8.  She admitted that she had been previously treated with iron and we will start that at 3 and 25 mg p.o. twice daily with food.  Her acetaminophen was less than 10, salicylate less than 7.  Beta-hCG was negative.  Blood alcohol on admission was 157.  Drug screen was positive for amphetamines.  EKG showed a sinus tachycardia with a QTc interval of 474.  We will repeat that EKG today.  We will also  order a TSH.  Her blood pressures remain mildly elevated this a.m.  It was 130/93, 160/39, and at 8:00 it was 121/88.  She is a bit tachycardic with a rate between 108 and 142.  Pulse oximetry on room air was 100%.    Observation Level/Precautions:  Detox 15 minute checks  Laboratory:  CBC Chemistry Profile HbAIC HCG UDS  Psychotherapy:    Medications:    Consultations:     Discharge Concerns:    Estimated LOS:  Other:     Physician Treatment Plan for Primary Diagnosis: <principal problem not specified> Long Term Goal(s): Improvement in symptoms so as ready for discharge  Short Term Goals: Ability to identify changes in lifestyle to reduce recurrence of condition will improve, Ability to verbalize feelings will improve, Ability to disclose and discuss suicidal ideas, Ability to demonstrate self-control will improve, Ability to identify and develop effective coping behaviors will improve, Ability to maintain clinical measurements within normal limits will improve, and Ability to identify triggers associated with substance abuse/mental health issues will improve  Physician Treatment Plan for Secondary Diagnosis: Active Problems:   Suicidal overdose (HCC)  Long Term Goal(s): Improvement in symptoms so as ready for discharge  Short Term Goals: Ability to identify changes in lifestyle to reduce recurrence of condition will improve, Ability to verbalize feelings will improve, Ability to disclose and discuss suicidal ideas, Ability to demonstrate self-control will improve, Ability to identify and develop effective coping behaviors will improve, Ability to maintain clinical measurements within normal limits will improve, and Ability to identify triggers associated with substance abuse/mental health issues will improve  I certify that inpatient services furnished can reasonably be expected to improve the patient's condition.    Antonieta Pert, MD 6/14/20224:51 PM

## 2021-02-23 NOTE — [facility] Suicide Risk Assessment (Signed)
[facility] Admission Suicide Risk Assessment   Nursing information obtained from:  Patient Demographic factors:  Low socioeconomic status Current Mental Status:  NA Loss Factors:  Financial problems / change in socioeconomic status Historical Factors:  Impulsivity, Family history of mental illness or substance abuse Risk Reduction Factors:  Employed, Positive social support, Sense of responsibility to family, Living with another person, especially a relative, Responsible for children under 64 years of age  Total Time spent with patient: 30 minutes Principal Problem: <principal problem not specified> Diagnosis:  Active Problems:   Suicidal overdose (HCC)  Subjective Data: Patient is seen and examined.  Patient is a 30 year old female with a past psychiatric history significant for attention deficit hyperactivity disorder and alcohol dependence who presented to the [facility] emergency department on 02/22/2021 after an intentional overdose of Zyrtec, Adderall XR and hydroxyzine.  She admitted to suicidality at that time.  Continued Clinical Symptoms:  Alcohol Use Disorder Identification Test Final Score (AUDIT): 25 The "Alcohol Use Disorders Identification Test", Guidelines for Use in Primary Care, Second Edition.  World Science writer [facility]). Score between 0-7:  no or low risk or alcohol related problems. Score between 8-15:  moderate risk of alcohol related problems. Score between 16-19:  high risk of alcohol related problems. Score 20 or above:  warrants further diagnostic evaluation for alcohol dependence and treatment.   CLINICAL FACTORS:   Depression:   Anhedonia Comorbid alcohol abuse/dependence Impulsivity Insomnia Alcohol/Substance Abuse/Dependencies More than one psychiatric diagnosis   Musculoskeletal: Strength & Muscle Tone: within normal limits Gait & Station: normal Patient leans: N/A  Psychiatric Specialty Exam:  Presentation  General  Appearance: Fairly Groomed  Eye Contact:Fair  Speech:Normal Rate  Speech Volume:Decreased  Handedness:Right   Mood and Affect  Mood:Depressed  Affect:Appropriate   Thought Process  Thought Processes:Coherent  Descriptions of Associations:Intact  Orientation:Full (Time, Place and Person)  Thought Content:Logical  History of Schizophrenia/Schizoaffective disorder:No  Duration of Psychotic Symptoms:No data recorded Hallucinations:Hallucinations: None  Ideas of Reference:None  Suicidal Thoughts:Suicidal Thoughts: No  Homicidal Thoughts:Homicidal Thoughts: No   Sensorium  Memory:Immediate Fair; Recent Fair; Remote Fair  Judgment:Fair  Insight:Fair   Executive Functions  Concentration:Fair  Attention Span:Fair  Recall:Fair  Fund of Knowledge:Fair  Language:Fair   Psychomotor Activity  Psychomotor Activity:Psychomotor Activity: Decreased   Assets  Assets:Desire for Improvement; Financial Resources/Insurance; Housing; Resilience; Social Support   Sleep  Sleep:Sleep: Poor Number of Hours of Sleep: 3.5    Physical Exam: Physical Exam Vitals and nursing note reviewed.  HENT:     Head: Normocephalic and atraumatic.  Pulmonary:     Effort: Pulmonary effort is normal.  Neurological:     General: No focal deficit present.     Mental Status: She is alert and oriented to person, place, and time.   Review of Systems  All other systems reviewed and are negative. Blood pressure 121/88, pulse (!) 102, temperature 98.3 F (36.8 C), temperature source Oral, height 5\' 7"  (1.702 m), weight 62.6 kg, last menstrual period 01/27/2021, SpO2 100 %. Body mass index is 21.61 kg/m.   COGNITIVE FEATURES THAT CONTRIBUTE TO RISK:  None    SUICIDE RISK:   Mild:  Suicidal ideation of limited frequency, intensity, duration, and specificity.  There are no identifiable plans, no associated intent, mild dysphoria and related symptoms, good self-control (both  objective and subjective assessment), few other risk factors, and identifiable protective factors, including available and accessible social support.  PLAN OF CARE: Patient is a 30 year old female with  the above-stated past psychiatric history who was admitted after an intentional overdose and suicide attempt.  She will be admitted to the hospital.  She will be integrated in the milieu.  She will be encouraged to attend groups.  First fall we will make sure she does not go into alcohol withdrawal.  I will add lorazepam 1 mg p.o. every 6 hours as needed withdrawal symptoms.  She will receive folic acid as well as thiamine.  She had previously taken Lexapro in the past, and we will restart that.  We will start at 20 mg p.o. daily.  Review of her admission laboratories revealed a mildly low bicarb at 21.  The rest of her electrolytes including liver function enzymes were normal.  She has a mild anemia with hemoglobin 11.3 and hematocrit of 34.2.  RDW was normal at 18.8.  She admitted that she had been previously treated with iron and we will start that at 3 and 25 mg p.o. twice daily with food.  Her acetaminophen was less than 10, salicylate less than 7.  Beta-hCG was negative.  Blood alcohol on admission was 157.  Drug screen was positive for amphetamines.  EKG showed a sinus tachycardia with a QTc interval of 474.  We will repeat that EKG today.  We will also order a TSH.  Her blood pressures remain mildly elevated this a.m.  It was 130/93, 160/39, and at 8:00 it was 121/88.  She is a bit tachycardic with a rate between 108 and 142.  Pulse oximetry on room air was 100%.  I certify that inpatient services furnished can reasonably be expected to improve the patient's condition.   Antonieta Pert, MD 02/23/2021, 11:34 AM

## 2021-02-24 DIAGNOSIS — F332 Major depressive disorder, recurrent severe without psychotic features: Secondary | ICD-10-CM

## 2021-02-24 DIAGNOSIS — F339 Major depressive disorder, recurrent, unspecified: Secondary | ICD-10-CM

## 2021-02-24 NOTE — Tx Team (Signed)
Interdisciplinary Treatment and Diagnostic Plan Update  02/24/2021 Time of Session: 9:10am  [name] MRN: [id number]  Principal Diagnosis: <principal problem not specified>  Secondary Diagnoses: Active Problems:   Suicidal overdose (HCC)   Current Medications:  Current Facility-Administered Medications  Medication Dose Route Frequency Provider Last Rate Last Admin   acetaminophen (TYLENOL) tablet 650 mg  650 mg Oral Q6H PRN Chalmers Guest, NP       alum & mag hydroxide-simeth (MAALOX/MYLANTA) 200-200-20 MG/5ML suspension 30 mL  30 mL Oral Q4H PRN Chalmers Guest, NP       escitalopram (LEXAPRO) tablet 20 mg  20 mg Oral Daily Sharma Covert, MD   20 mg at 02/24/21 1017   ferrous sulfate tablet 325 mg  325 mg Oral BID WC Sharma Covert, MD   325 mg at 02/24/21 1017   hydrOXYzine (ATARAX/VISTARIL) tablet 25 mg  25 mg Oral Q6H PRN Ajibola, Ene A, NP   25 mg at 02/24/21 1017   loperamide (IMODIUM) capsule 2-4 mg  2-4 mg Oral PRN Ajibola, Ene A, NP       LORazepam (ATIVAN) tablet 1 mg  1 mg Oral Q6H PRN Sharma Covert, MD       magnesium hydroxide (MILK OF MAGNESIA) suspension 30 mL  30 mL Oral Daily PRN Chalmers Guest, NP       multivitamin with minerals tablet 1 tablet  1 tablet Oral Daily Ajibola, Ene A, NP   1 tablet at 02/24/21 1017   nicotine (NICODERM CQ - dosed in mg/24 hours) patch 21 mg  21 mg Transdermal Daily Ajibola, Ene A, NP   21 mg at 02/24/21 1018   ondansetron (ZOFRAN-ODT) disintegrating tablet 4 mg  4 mg Oral Q6H PRN Ajibola, Ene A, NP   4 mg at 02/23/21 0123   thiamine (B-1) injection 100 mg  100 mg Intramuscular Once Ajibola, Ene A, NP       thiamine tablet 100 mg  100 mg Oral Daily Chalmers Guest, NP   100 mg at 02/24/21 1017   PTA Medications: Medications Prior to Admission  Medication Sig Dispense Refill Last Dose   albuterol (VENTOLIN HFA) 108 (90 Base) MCG/ACT inhaler Inhale into the lungs every 6 (six) hours as needed for wheezing or shortness of  breath.      fluticasone (FLONASE) 50 MCG/ACT nasal spray Place 2 sprays into both nostrils daily. 16 mL 0 Past Week   ADDERALL XR 15 MG 24 hr capsule Take 1 capsule by mouth every morning.      cetirizine (ZYRTEC) 10 MG tablet Take 1 tablet by mouth daily.      escitalopram (LEXAPRO) 20 MG tablet Take 1 tablet by mouth daily.       Patient Stressors: Financial difficulties Marital or family conflict Occupational concerns Substance abuse  Patient Strengths: Ability for insight Average or above average Architect for treatment/growth Physical Health Supportive family/friends  Treatment Modalities: Medication Management, Group therapy, Case management,  1 to 1 session with clinician, Psychoeducation, Recreational therapy.   Physician Treatment Plan for Primary Diagnosis: <principal problem not specified> Long Term Goal(s): Improvement in symptoms so as ready for discharge   Short Term Goals: Ability to identify changes in lifestyle to reduce recurrence of condition will improve Ability to verbalize feelings will improve Ability to disclose and discuss suicidal ideas Ability to demonstrate self-control will improve Ability to identify and develop effective coping behaviors will improve Ability to maintain clinical  measurements within normal limits will improve Ability to identify triggers associated with substance abuse/mental health issues will improve  Medication Management: Evaluate patient's response, side effects, and tolerance of medication regimen.  Therapeutic Interventions: 1 to 1 sessions, Unit Group sessions and Medication administration.  Evaluation of Outcomes: Not Met  Physician Treatment Plan for Secondary Diagnosis: Active Problems:   Suicidal overdose ([place])  Long Term Goal(s): Improvement in symptoms so as ready for discharge   Short Term Goals: Ability to identify changes in lifestyle to reduce recurrence of condition will  improve Ability to verbalize feelings will improve Ability to disclose and discuss suicidal ideas Ability to demonstrate self-control will improve Ability to identify and develop effective coping behaviors will improve Ability to maintain clinical measurements within normal limits will improve Ability to identify triggers associated with substance abuse/mental health issues will improve     Medication Management: Evaluate patient's response, side effects, and tolerance of medication regimen.  Therapeutic Interventions: 1 to 1 sessions, Unit Group sessions and Medication administration.  Evaluation of Outcomes: Not Met   RN Treatment Plan for Primary Diagnosis: <principal problem not specified> Long Term Goal(s): Knowledge of disease and therapeutic regimen to maintain health will improve  Short Term Goals: Ability to remain free from injury will improve, Ability to participate in decision making will improve, Ability to verbalize feelings will improve, Ability to disclose and discuss suicidal ideas, and Ability to identify and develop effective coping behaviors will improve  Medication Management: RN will administer medications as ordered by provider, will assess and evaluate patient's response and provide education to patient for prescribed medication. RN will report any adverse and/or side effects to prescribing provider.  Therapeutic Interventions: 1 on 1 counseling sessions, Psychoeducation, Medication administration, Evaluate responses to treatment, Monitor vital signs and CBGs as ordered, Perform/monitor CIWA, COWS, AIMS and Fall Risk screenings as ordered, Perform wound care treatments as ordered.  Evaluation of Outcomes: Not Met   LCSW Treatment Plan for Primary Diagnosis: <principal problem not specified> Long Term Goal(s): Safe transition to appropriate next level of care at discharge, Engage patient in therapeutic group addressing interpersonal concerns.  Short Term Goals:  Engage patient in aftercare planning with referrals and resources, Increase social support, Increase emotional regulation, Facilitate acceptance of mental health diagnosis and concerns, Identify triggers associated with mental health/substance abuse issues, and Increase skills for wellness and recovery  Therapeutic Interventions: Assess for all discharge needs, 1 to 1 time with Social worker, Explore available resources and support systems, Assess for adequacy in community support network, Educate family and significant other(s) on suicide prevention, Complete Psychosocial Assessment, Interpersonal group therapy.  Evaluation of Outcomes: Not Met   Progress in Treatment: Attending groups: Yes. Participating in groups: Yes. Taking medication as prescribed: Yes. Toleration medication: Yes. Family/Significant other contact made: Yes, individual(s) contacted:  Sister Patient understands diagnosis: Yes. and No. Discussing patient identified problems/goals with staff: Yes. Medical problems stabilized or resolved: Yes. Denies suicidal/homicidal ideation: Yes. Issues/concerns per patient self-inventory: No.   New problem(s) identified: No, Describe:  None   New Short Term/Long Term Goal(s): medication stabilization, elimination of SI thoughts, development of comprehensive mental wellness plan.   Patient Goals: "To get back on my routine and learn some coping skills"   Discharge Plan or Barriers: Patient recently admitted. CSW will continue to follow and assess for appropriate referrals and possible discharge planning.   Reason for Continuation of Hospitalization: Depression Medication stabilization Suicidal ideation Withdrawal symptoms  Estimated Length of Stay: 3 to 5  days   Attendees: Patient: Lindsey Castillo  02/24/2021   Physician: Myles Lipps, MD 02/24/2021   Nursing:  02/24/2021   RN Care Manager: 02/24/2021   Social Worker: Verdis Frederickson, [place] 02/24/2021   Recreational Therapist:   02/24/2021   Other:  02/24/2021   Other:  02/24/2021   Other: 02/24/2021     Scribe for Treatment Team: Darleen Crocker, LCSWA 02/24/2021 11:18 AM

## 2021-02-24 NOTE — Progress Notes (Signed)
[facility]  02/24/2021 2:10 PM Lindsey Castillo  MRN:  [id number]  Subjective: Lindsey Castillo reports, "I'm doing much better today. Learning a lot on how to manage my stress/anxiety after discharge. 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 I was also dealing with child-support/custody battle with baby daddy. Things got to so overwhelmed that I did not think I could pull through. It was bad".  Reason for admission: Patient is a 30 year old female with a past psychiatric history significant for attention deficit hyperactivity disorder and alcohol dependence who [facility] on 02/22/2021 after an intentional overdose of Zyrtec, Adderall XR and hydroxyzine. Objective: Lindsey Castillo is seen, chart reviewed. The chart findings discussed with the treatment team. She presents alert oriented x 4. She is cheerful, visible on the unit, attending group sessions. She says she is doing better. Is taking & tolerating her treatment regimen. Denies any side effects. She says she came to the hospital because she was feeling overwhelmed with all the stressors in her life at this time. She says she is a single mother dealing with child custody, working full time while trying to attend college. She says she felt there is no end to this stress & decided to end her life by overdose on multiple medications. Lindsey Castillo says she could think clearly now & has realized that what she did could not have helped her situation in anyway. She says she is attending group sessions, learning some coping skill. She is taking & tolerating her treatment regimen. She denies any SIHI, AVH, delusional thoughts or paranoia. She does not appear to be responding to any internal stimuli. Lindsey Castillo is in agreement to continue current plan of care as already in progress.  Principal Problem: Major depressive disorder, recurrent episode (HCC)  Diagnosis: Principal  Problem:   Major depressive disorder, recurrent episode (HCC) Active Problems:   Suicidal overdose (HCC)  Total Time spent with patient:  25 minutes  Past Psychiatric History: See H&P  Past Medical History:  Past Medical History:  Diagnosis Date   Abnormal Pap smear    last pap 07/2012   Allergy    Anemia    Anxiety    Asthma    USES INHALER   Depression    Eczema    Fracture, ankle AGE 64   Headache(784.0)    Infection    trich   Infection    BV   Infection    HX OF FREQUENT   NSVD (normal spontaneous vaginal delivery) 01/01/2013   SVD at 1525   Seasonal allergies     Past Surgical History:  Procedure Laterality Date   FOOT SURGERY Right    "15 splinters in my foot"   Family History:  Family History  Problem Relation Age of Onset   Hypertension Father    Heart disease Father    Thyroid disease Father    Alcoholism Father    Asthma Father    Cancer Maternal Grandmother    Diabetes Maternal Grandmother    Kidney disease Maternal Grandmother    Arthritis Maternal Grandmother    Hypertension Maternal Grandmother    Depression Mother    Mental illness Mother    Birth defects Sister        HEART MURMUR   Asthma Brother    Miscarriages / [place] Cousin    Thyroid disease Sister    Family Psychiatric  History: See H&P  Social History:  Social History   Substance and Sexual Activity  Alcohol Use Yes   Alcohol/week: 56.0 standard drinks   Types: 56 Cans of beer per week   Comment: drinks (4) 24-oz beers a day     Social History   Substance and Sexual Activity  Drug Use Not Currently    Social History   Socioeconomic History   Marital status: Single    Spouse name: Not on file   Number of children: 1   Years of education: 12   Highest education level: Not on file  Occupational History   Occupation: CASHIER  Tobacco Use   Smoking status: Every Day    Packs/day: 1.00    Years: 11.00    Pack years: 11.00    Types: Cigarettes    Last  attempt to quit: 05/24/2012    Years since quitting: 8.7   Smokeless tobacco: Never  Vaping Use   Vaping Use: Never used  Substance and Sexual Activity   Alcohol use: Yes    Alcohol/week: 56.0 standard drinks    Types: 56 Cans of beer per week    Comment: drinks (4) 24-oz beers a day   Drug use: Not Currently   Sexual activity: Not Currently    Partners: Male    Birth control/protection: None  Other Topics Concern   Not on file  Social History Narrative   Pt lives with grandmother and 45 year old son. Pt works at [employer]. Dealing with financial, occupational and family stresses.   Social Determinants of Health   Financial Resource Strain: Not on file  Food Insecurity: Not on file  Transportation Needs: Not on file  Physical Activity: Not on file  Stress: Not on file  Social Connections: Not on file   Additional Social History:    Sleep: Fair  Appetite:  Good  Current Medications: Current Facility-Administered Medications  Medication Dose Route Frequency Provider Last Rate Last Admin   acetaminophen (TYLENOL) tablet 650 mg  650 mg Oral Q6H PRN Novella Olive, NP       alum & mag hydroxide-simeth (MAALOX/MYLANTA) 200-200-20 MG/5ML suspension 30 mL  30 mL Oral Q4H PRN Novella Olive, NP       escitalopram (LEXAPRO) tablet 20 mg  20 mg Oral Daily Antonieta Pert, MD   20 mg at 02/24/21 1017   ferrous sulfate tablet 325 mg  325 mg Oral BID WC Antonieta Pert, MD   325 mg at 02/24/21 1017   hydrOXYzine (ATARAX/VISTARIL) tablet 25 mg  25 mg Oral Q6H PRN Ajibola, Ene A, NP   25 mg at 02/24/21 1017   loperamide (IMODIUM) capsule 2-4 mg  2-4 mg Oral PRN Ajibola, Ene A, NP       LORazepam (ATIVAN) tablet 1 mg  1 mg Oral Q6H PRN Antonieta Pert, MD       magnesium hydroxide (MILK OF MAGNESIA) suspension 30 mL  30 mL Oral Daily PRN Novella Olive, NP       multivitamin with minerals tablet 1 tablet  1 tablet Oral Daily Ajibola, Ene A, NP   1 tablet at 02/24/21 1017   nicotine  (NICODERM CQ - dosed in mg/24 hours) patch 21 mg  21 mg Transdermal Daily Ajibola, Ene A, NP   21 mg at 02/24/21 1018   ondansetron (ZOFRAN-ODT) disintegrating tablet 4 mg  4 mg Oral Q6H PRN Ajibola, Ene A, NP   4 mg at 02/23/21 0123   thiamine (B-1) injection  100 mg  100 mg Intramuscular Once Ajibola, Ene A, NP       thiamine tablet 100 mg  100 mg Oral Daily Novella Olive, NP   100 mg at 02/24/21 1017    Lab Results:  Results for orders placed or performed during the hospital encounter of 02/23/21 (from the past 48 hour(s))  TSH     Status: None   Collection Time: 02/23/21  6:07 PM  Result Value Ref Range   TSH 3.116 0.350 - 4.500 uIU/mL    Comment: Performed by a 3rd Generation assay with a functional sensitivity of <=0.01 uIU/mL. Performed at [facility], [address] [place], [place] [address]     Blood Alcohol level:  Lab Results  Component Value Date   ETH 157 (H) 02/22/2021   ETH 105 (H) 12/06/2014    Metabolic Disorder Labs: Lab Results  Component Value Date   HGBA1C 5.1 11/30/2015   MPG 100 11/30/2015   Lab Results  Component Value Date   PROLACTIN 6.2 11/30/2015   No results found for: CHOL, TRIG, HDL, CHOLHDL, VLDL, LDLCALC  Physical Findings: AIMS:  , ,  ,  ,    CIWA:  CIWA-Ar Total: 10 COWS:     Musculoskeletal: Strength & Muscle Tone: within normal limits Gait & Station: normal Patient leans: N/A  Psychiatric Specialty Exam:  Presentation  General Appearance: Fairly Groomed  Eye Contact:Fair  Speech:Normal Rate  Speech Volume:Decreased  Handedness:Right   Mood and Affect  Mood:Depressed  Affect:Appropriate  Thought Process  Thought Processes:Coherent  Descriptions of Associations:Intact  Orientation:Full (Time, Place and Person)  Thought Content:Logical  History of Schizophrenia/Schizoaffective disorder:No  Duration of Psychotic Symptoms:No data recorded Hallucinations:Hallucinations: None  Ideas of  Reference:None  Suicidal Thoughts:Suicidal Thoughts: No  Homicidal Thoughts:Homicidal Thoughts: No  Sensorium  Memory:Immediate Fair; Recent Fair; Remote Fair  Judgment:Fair  Insight:Fair  Executive Functions  Concentration:Fair  Attention Span:Fair  Recall:Fair  Fund of Knowledge:Fair  Language:Fair   Psychomotor Activity  Psychomotor Activity:Psychomotor Activity: Decreased  Assets  Assets:Desire for Improvement; Financial Resources/Insurance; Housing; Resilience; Social Support  Sleep  Sleep:Sleep: Poor Number of Hours of Sleep: 3.5  Physical Exam: Physical Exam Vitals and nursing note reviewed.  HENT:     Head: Normocephalic.     Nose: Nose normal.     Mouth/Throat:     Pharynx: Oropharynx is clear.  Eyes:     Pupils: Pupils are equal, round, and reactive to light.  Cardiovascular:     Rate and Rhythm: Normal rate.     Pulses: Normal pulses.  Pulmonary:     Effort: Pulmonary effort is normal.  Genitourinary:    Comments: Deferred Musculoskeletal:        General: Normal range of motion.     Cervical back: Normal range of motion.  Skin:    General: Skin is warm and dry.  Neurological:     General: No focal deficit present.     Mental Status: She is alert and oriented to person, place, and time.   ROS Blood pressure (!) 123/95, pulse 98, temperature 98.2 F (36.8 C), temperature source Oral, resp. rate 18, height 5\' 7"  (1.702 m), weight 62.6 kg, last menstrual period 01/27/2021, SpO2 100 %. Body mass index is 21.61 kg/m.  Treatment Plan Summary: Daily contact with patient to assess and evaluate symptoms and progress in treatment and Medication management.   Continue inpatient hospitalization.  Will continue today 02/24/2021 plan as below except where it is noted.  Depression. Continue Lexapro 20 mg po daily.   Anxiety.  Continue Vistaril 25 mg po for anxiety/CIWA >10. Continue Lorazepam 1 mg po Q 6 hours prn for CIWA >10.  Other prn  medications.  Continue Tylenol 650 mg po Q 4 hours prn for pain/fever Continue Mylanta 30 ml po Q 4 hrs prn for indigestion.  Continue Imodium 2-4 mg po prn for loose stools. Continue MOM 30 ml po prn daily for constipation.  Multivitamin 1 tablet po daily for nutritional supplement.  Continue Zofran ODT 4 mg po Q 6 hrs prn for N/V. Continue Nicotine patch 21 mg topically Q 24 hrs for smoking cessation.   Encourage group attendance & participation.  Discharge disposition plan ongoing.  Armandina Stammer, NP, pmhnp, fnp-bc 02/24/2021, 2:10 PM

## 2021-02-24 NOTE — Progress Notes (Addendum)
   D: Pt presents with mild depression and moderate anxiety (5/10).  Per pt request, administered 25 mg Vistaril PRN per MAR.  Pt observed in dayroom and hallway.  Pt reports she stopped her therapy and did not take her medications, which led to her being here.  Pt reports that being here has helped her stop drinking, but she cannot commit to stop smoking Newports. Pt reports she wants to get her life together, she now has coping skills, she wants to return taking Bible classes and has ambition to write a book.  Pt denies SI/HI, but verbally contracts for safety should the thoughts arise.  Pt denies AVH.  A:  Labs/Vitals monitored; Medication education provided; Pt encouraged to communicate concerns.  R: Pt remains safe on unit with q15 minute safety checks; Will continue POC.      02/24/21 2139  Psych Admission Type (Psych Patients Only)  Admission Status Voluntary  Psychosocial Assessment  Patient Complaints Anxiety;Depression  Eye Contact Fair  Facial Expression Anxious  Affect Anxious  Speech Logical/coherent  Interaction Assertive  Motor Activity Other (Comment) (WDL)  Appearance/Hygiene Unremarkable;In scrubs  Behavior Characteristics Cooperative;Calm  Mood Anxious;Depressed  Thought Process  Coherency WDL  Content WDL  Delusions None reported or observed  Perception WDL  Hallucination None reported or observed  Judgment WDL  Confusion None  Danger to Self  Current suicidal ideation? Denies  Self-Injurious Behavior No self-injurious ideation or behavior indicators observed or expressed   Agreement Not to Harm Self Yes  Description of Agreement Verbal contract for safety  Danger to Others  Danger to Others None reported or observed

## 2021-02-24 NOTE — Progress Notes (Signed)
   02/24/21 1013  Vital Signs  Pulse Rate 81  Pulse Rate Source Dinamap  BP 120/90  BP Location Left Arm  BP Method Automatic  Patient Position (if appropriate) Sitting   D:  Patient denies SI/HI/AVH.  Pt. Stated  that she felt "light headed"  Pt. Rated anxiety 4/10 and denied depression.  Pt.'s CIWA at 1000 was 10. BP 120/90, pulse 81. A:  Patient took scheduled medicine. Pt. Given 25 mg of vistaril for anxiety.CIWA was 10, but pt. Felt light headed  Support and encouragement provided Routine safety checks conducted every 15 minutes. Patient  Informed to notify staff with any concerns.   R:  Safety maintained.

## 2021-02-24 NOTE — Progress Notes (Signed)
Adult Psychoeducational Group Note  Date:  02/24/2021 Time:  10:44 AM  Group Topic/Focus:  Goals Group:   The focus of this group is to help patients establish daily goals to achieve during treatment and discuss how the patient can incorporate goal setting into their daily lives to aide in recovery.  Participation Level:  Active  Participation Quality:  Appropriate and Attentive  Affect:  Appropriate  Cognitive:  Alert and Appropriate  Insight: Appropriate and Good  Engagement in Group:  Engaged  Modes of Intervention:  Discussion  Additional Comments:  Pt has a goal of getting back to a routine schedule and holding herself accountable to it as well.   Lindsey Castillo Lindsey Castillo 02/24/2021, 10:44 AM

## 2021-02-24 NOTE — Plan of Care (Signed)
  Problem: Education: Goal: Emotional status will improve Outcome: Progressing Goal: Mental status will improve Outcome: Progressing   Problem: Education: Goal: Knowledge of disease or condition will improve Outcome: Progressing   

## 2021-02-24 NOTE — Progress Notes (Signed)
Recreation Therapy Notes  Date: 6.15.22 Time: 0930 Location: 300 Hall Dayroom  Group Topic: Stress Management   Goal Area(s) Addresses:  Patient will actively participate in stress management techniques presented during session.  Patient will successfully identify benefit of practicing stress management post d/c.   Intervention: Guided exercise with ambient sound and script  Activity :Guided Imagery  LRT provided education, instruction, and demonstration on practice of visualization via guided imagery. Patient was asked to participate in the technique introduced during session. LRT also debriefed including topics of mindfulness, stress management and specific scenarios each patient could use these techniques. Patients were given suggestions of ways to access scripts post d/c and encouraged to explore Youtube and other apps available on smartphones, tablets, and computers.   Education:  Stress Management, Discharge Planning.   Education Outcome: Acknowledges education  Clinical Observations/Feedback: Patient did not attend group activity.     Caroll Rancher, LRT/CTRS         Caroll Rancher A 02/24/2021 11:42 AM

## 2021-02-24 NOTE — Progress Notes (Signed)
Psychoeducational Group Note  Date:  02/24/2021 Time:  2033  Group Topic/Focus:  Wrap-Up Group:   The focus of this group is to help patients review their daily goal of treatment and discuss progress on daily workbooks.  Participation Level: Did Not Attend  Participation Quality:  Not Applicable  Affect:  Not Applicable  Cognitive:  Not Applicable  Insight:  Not Applicable  Engagement in Group: Not Applicable  Additional Comments:  The patient did not attend group this evening.   Hazle Coca S 02/24/2021, 8:33 PM

## 2021-02-24 NOTE — Progress Notes (Signed)
DAR NOTE: Patient presents with anxious affect and depressed mood.  Denies pain, auditory and visual hallucinations. Maintained on routine safety checks.    Support and encouragement offered as needed.  Will continue to monitor.

## 2021-02-25 MED ORDER — NICOTINE 21 MG/24HR TD PT24
21.0000 mg | MEDICATED_PATCH | Freq: Every day | TRANSDERMAL | Status: DC
  Filled 2021-02-25 (×3): qty 1

## 2021-02-25 MED ORDER — HYDROXYZINE HCL 25 MG PO TABS: 25.0000 mg | ORAL_TABLET | Freq: Four times a day (QID) | ORAL | 0 refills | Status: DC | PRN

## 2021-02-25 NOTE — Discharge Summary (Signed)
Physician Discharge Summary Note  Patient:  Lindsey Castillo is an 30 y.o., female MRN:  [id number] DOB:  05/24/1991 Patient phone:  [phone] (home)  Patient address:   [address] [place] [address],  Total Time spent with patient: 30 minutes  Date of Admission:  02/23/2021 Date of Discharge: 02/25/2021  Reason for Admission:  Lindsey Castillo is a 30 year old female who was admitted to the [facility] from [facility] after an intentional overdose of Adderall, hydroxyzine, and certrizine. Patient sttes she was intoxicated at the time and vomited x 1 prior to arrival at the ED. Patient has a history of anxiety, depression, alcohol misuse x 107yr per her report.   Principal Problem: Major depressive disorder, recurrent episode (HCC) Discharge Diagnoses: Principal Problem:   Major depressive disorder, recurrent episode (HCC) Active Problems:   Suicidal overdose (HCC)   Past Psychiatric History: Lindsey Castillo is a   Past Medical History:  Past Medical History:  Diagnosis Date   Abnormal Pap smear    last pap 07/2012   Allergy    Anemia    Anxiety    Asthma    USES INHALER   Depression    Eczema    Fracture, ankle AGE 77   Headache(784.0)    Infection    trich   Infection    BV   Infection    HX OF FREQUENT   NSVD (normal spontaneous vaginal delivery) 01/01/2013   SVD at 1525   Seasonal allergies     Past Surgical History:  Procedure Laterality Date   FOOT SURGERY Right    "15 splinters in my foot"   Family History:  Family History  Problem Relation Age of Onset   Hypertension Father    Heart disease Father    Thyroid disease Father    Alcoholism Father    Asthma Father    Cancer Maternal Grandmother    Diabetes Maternal Grandmother    Kidney disease Maternal Grandmother    Arthritis Maternal Grandmother    Hypertension Maternal Grandmother    Depression Mother    Mental illness Mother    Birth defects Sister        HEART MURMUR   Asthma Brother    Miscarriages /  [place] Cousin    Thyroid disease Sister    Family Psychiatric  History: Per H&P: "Patient stated mother and father both had issues with depression and alcohol." Social History:  Social History   Substance and Sexual Activity  Alcohol Use Yes   Alcohol/week: 56.0 standard drinks   Types: 56 Cans of beer per week   Comment: drinks (4) 24-oz beers a day     Social History   Substance and Sexual Activity  Drug Use Not Currently    Social History   Socioeconomic History   Marital status: Single    Spouse name: Not on file   Number of children: 1   Years of education: 12   Highest education level: Not on file  Occupational History   Occupation: Conservation officer, nature  Tobacco Use   Smoking status: Every Day    Packs/day: 1.00    Years: 11.00    Pack years: 11.00    Types: Cigarettes    Last attempt to quit: 05/24/2012    Years since quitting: 8.7   Smokeless tobacco: Never  Vaping Use   Vaping Use: Never used  Substance and Sexual Activity   Alcohol use: Yes    Alcohol/week: 56.0 standard drinks    Types: 56 Cans  of beer per week    Comment: drinks (4) 24-oz beers a day   Drug use: Not Currently   Sexual activity: Not Currently    Partners: Male    Birth control/protection: None  Other Topics Concern   Not on file  Social History Narrative   Pt lives with grandmother and 63 year old son. Pt works at [employer]. Dealing with financial, occupational and family stresses.   Social Determinants of Health   Financial Resource Strain: Not on file  Food Insecurity: Not on file  Transportation Needs: Not on file  Physical Activity: Not on file  Stress: Not on file  Social Connections: Not on file    Hospital Course:  After the above admission evaluation, patient was admitted to the [facility] for medication management and treatment after intentional overdose of Zyrtec, Adderall XR and hydroxyzine. Patient was intoxicated at the time. There were no instances of behavior that required  restraints or immediate intervention. Patient remained safe on the unit. There were no instances of self-harming behavior. Patient remained cooperative, participated in group sessions and interacted with staff and patients appropriately. Patient was placed on CIWA protocol and was given Ativan x 2 on the first day. Patient was restarted on her outpatient Lexapro, 20 mg. Over the course of her hospitalization, Patient states that her symptoms of anxiety and depression have improved. She denies thoughts of self-harm and suicidal ideation. She expresses that she has learned better coping strategies to alleviate her symptoms. Patient expresses readiness for discharge and future-oriented thinking. Patient encouraged to keep all psychiatric appointments, take medications as directed, and keep all follow up appointments.        Physical Findings: AIMS:  , ,  ,  ,    CIWA:  CIWA-Ar Total: 2 COWS:     Musculoskeletal: Strength & Muscle Tone: within normal limits Gait & Station: normal Patient leans: N/A   Psychiatric Specialty Exam:  See physician's discharge SRA     Physical Exam: Physical Exam Vitals and nursing note reviewed.  HENT:     Head: Normocephalic.     Nose: No congestion or rhinorrhea.  Eyes:     General:        Right eye: No discharge.        Left eye: No discharge.  Pulmonary:     Effort: Pulmonary effort is normal.  Musculoskeletal:        General: Normal range of motion.     Cervical back: Normal range of motion.  Neurological:     Mental Status: She is alert and oriented to person, place, and time.   Review of Systems  Psychiatric/Behavioral:  Positive for depression (Hx of. stable for discharge) and substance abuse (hx of alcohol abuse. Stable for discharge). Negative for hallucinations, memory loss and suicidal ideas. The patient is nervous/anxious (hx of. stable for discharge). The patient does not have insomnia.   Blood pressure 105/72, pulse 91, temperature 98.2 F  (36.8 C), temperature source Oral, resp. rate 18, height 5\' 7"  (1.702 m), weight 62.6 kg, last menstrual period 01/27/2021, SpO2 100 %. Body mass index is 21.61 kg/m.   Social History   Tobacco Use  Smoking Status Every Day   Packs/day: 1.00   Years: 11.00   Pack years: 11.00   Types: Cigarettes   Last attempt to quit: 05/24/2012   Years since quitting: 8.7  Smokeless Tobacco Never   Tobacco Cessation:  Prescription not provided because: It is available over the counter  Blood Alcohol level:  Lab Results  Component Value Date   ETH 157 (H) 02/22/2021   ETH 105 (H) 12/06/2014    Metabolic Disorder Labs:  Lab Results  Component Value Date   HGBA1C 5.1 11/30/2015   MPG 100 11/30/2015   Lab Results  Component Value Date   PROLACTIN 6.2 11/30/2015   No results found for: CHOL, TRIG, HDL, CHOLHDL, VLDL, LDLCALC  See Psychiatric Specialty Exam and Suicide Risk Assessment completed by Attending Physician prior to discharge.  Discharge destination:  Home  Is patient on multiple antipsychotic therapies at discharge:  No   Has Patient had three or more failed trials of antipsychotic monotherapy by history:  No  Recommended Plan for Multiple Antipsychotic Therapies: NA  Discharge Instructions     Discharge instructions   Complete by: As directed    Discharge Recommendations:  Marlette Curvin is being discharged to home. Please take your discharge medications as ordered until you see your outpatient provider, who may make adjustments. See follow up appointments below. Please follow up with primary care provider for all other medical needs and follow recommended schedule for well exams.   Please monitor for recurrent suicidal ideation. If your symptoms worsen or do not continue to improve or you  become actively suicidal or homicidal then it is recommended that you return to the closest hospital emergency room or call 911 for further evaluation and treatment. National Suicide  Prevention Lifeline 1800-SUICIDE or [phone]. Abstain from all illicit substances and alcohol.   Increase activity slowly   Complete by: As directed       Allergies as of 02/25/2021       Reactions   Other    Coconut        Medication List     STOP taking these medications    Adderall XR 15 MG 24 hr capsule Generic drug: amphetamine-dextroamphetamine       TAKE these medications      Indication  albuterol 108 (90 Base) MCG/ACT inhaler Commonly known as: VENTOLIN HFA Inhale into the lungs every 6 (six) hours as needed for wheezing or shortness of breath.  Indication: Spasm of Lung Air Passages   cetirizine 10 MG tablet Commonly known as: ZYRTEC Take 1 tablet by mouth daily.  Indication: Hayfever   escitalopram 20 MG tablet Commonly known as: LEXAPRO Take 1 tablet by mouth daily.  Indication: Major Depressive Disorder   fluticasone 50 MCG/ACT nasal spray Commonly known as: FLONASE Place 2 sprays into both nostrils daily.  Indication: Allergic Rhinitis   hydrOXYzine 25 MG tablet Commonly known as: ATARAX/VISTARIL Take 1 tablet (25 mg total) by mouth every 6 (six) hours as needed for anxiety (anxiety/agitation or CIWA < or = 10).  Indication: Feeling Anxious        Follow-up Information     Medicine, [facility]. Call on 03/10/2021.   Specialty: Family Medicine Why: You have an appointment for medication management services on 03/10/21 at 3:00 pm.  This appointment will virtual telehealth. Contact information: [address] [place] [address] [phone]         [facility], [facility] Follow up.   Why: Please go to this provider for therapy services during the walk in hours for new patients:  Monday through Friday from 9:00 am to 2:00 pm.  After the initial appointments, you may schedule Virtually. Contact information: [address] [place] [address] [phone]  Follow-up  recommendations:  Follow up with outpatient provider for all your medical care needs. Activity as tolerated. Diet as recommended by your outpatient provider.  Comments:  Take all of your medications as prescribed   Report any side effects to your outpatient provider promptly.  Refrain from alcohol and illegal drug use while taking medications.  In the case of emergency call 911 or go to the nearest emergency department for evaluation/treatment   Signed: Vanetta MuldersLouise F Krislynn Gronau, NP, PMHNP-BC 02/25/2021, 10:10 AM

## 2021-02-25 NOTE — [facility] Suicide Risk Assessment (Signed)
[facility] Discharge Suicide Risk Assessment   Principal Problem: Major depressive disorder, recurrent episode (HCC) Discharge Diagnoses: Principal Problem:   Major depressive disorder, recurrent episode (HCC) Active Problems:   Suicidal overdose (HCC)   Total Time spent with patient: 15 minutes  Musculoskeletal: Strength & Muscle Tone: within normal limits Gait & Station: normal Patient leans: N/A  Psychiatric Specialty Exam  Presentation  General Appearance: Fairly Groomed  Eye Contact:Fair  Speech:Normal Rate  Speech Volume:Decreased  Handedness:Right   Mood and Affect  Mood:Depressed  Duration of Depression Symptoms: Greater than two weeks  Affect:Appropriate   Thought Process  Thought Processes:Coherent  Descriptions of Associations:Intact  Orientation:Full (Time, Place and Person)  Thought Content:Logical  History of Schizophrenia/Schizoaffective disorder:No  Duration of Psychotic Symptoms:No data recorded Hallucinations:No data recorded Ideas of Reference:None  Suicidal Thoughts:No data recorded Homicidal Thoughts:No data recorded  Sensorium  Memory:Immediate Fair; Recent Fair; Remote Fair  Judgment:Fair  Insight:Fair   Executive Functions  Concentration:Fair  Attention Span:Fair  Recall:Fair  Fund of Knowledge:Fair  Language:Fair   Psychomotor Activity  Psychomotor Activity: No data recorded  Assets  Assets:Desire for Improvement; Financial Resources/Insurance; Housing; Resilience; Social Support   Sleep  Sleep: No data recorded  Physical Exam: Physical Exam Vitals and nursing note reviewed.  Constitutional:      Appearance: Normal appearance.  HENT:     Head: Normocephalic and atraumatic.  Pulmonary:     Effort: Pulmonary effort is normal.  Neurological:     General: No focal deficit present.     Mental Status: She is alert and oriented to person, place, and time.   Review of Systems  All other systems reviewed  and are negative. Blood pressure 105/72, pulse 91, temperature 98.2 F (36.8 C), temperature source Oral, resp. rate 18, height 5\' 7"  (1.702 m), weight 62.6 kg, last menstrual period 01/27/2021, SpO2 100 %. Body mass index is 21.61 kg/m.  Mental Status Per Nursing Assessment::   On Admission:  NA  Demographic Factors:  Low socioeconomic status and Unemployed  Loss Factors: Loss of significant relationship  Historical Factors: Impulsivity  Risk Reduction Factors:   Responsible for children under 97 years of age, Sense of responsibility to family, Religious beliefs about death, and Positive coping skills or problem solving skills  Continued Clinical Symptoms:  Depression:   Impulsivity Alcohol/Substance Abuse/Dependencies  Cognitive Features That Contribute To Risk:  None    Suicide Risk:  Minimal: No identifiable suicidal ideation.  Patients presenting with no risk factors but with morbid ruminations; may be classified as minimal risk based on the severity of the depressive symptoms   Follow-up Information     Medicine, [facility]. Call on 03/10/2021.   Specialty: Family Medicine Why: You have an appointment for medication management services on 03/10/21 at 3:00 pm.  This appointment will virtual telehealth. Contact information: [address] [place] [phone]         [facility], [facility] Follow up.   Why: Please go to this provider for therapy services during the walk in hours for new patients:  Monday through Friday from 9:00 am to 2:00 pm.  After the initial appointments, you may schedule Virtually. Contact information: [address] [place] [phone]                 Plan Of Care/Follow-up recommendations:  Activity:  ad lib  [phone], MD 02/25/2021, 8:16 AM

## 2021-02-25 NOTE — [facility] Suicide Risk Assessment (Signed)
[facility] INPATIENT:  Family/Significant Other Suicide Prevention Education  Suicide Prevention Education:  Education Completed; Lindsey Castillo, Sister, [phone],  (name of family member/significant other) has been identified by the patient as the family member/significant other with whom the patient will be residing, and identified as the person(s) who will aid the patient in the event of a mental health crisis (suicidal ideations/suicide attempt).  With written consent from the patient, the family member/significant other has been provided the following suicide prevention education, prior to the and/or following the discharge of the patient.  The suicide prevention education provided includes the following: Suicide risk factors Suicide prevention and interventions National Suicide Hotline telephone number [facility] assessment telephone number [facility] Emergency Assistance 911 [facility] and/or Residential Mobile Crisis Unit telephone number  Request made of family/significant other to: Remove weapons (e.g., guns, rifles, knives), all items previously/currently identified as safety concern.   Remove drugs/medications (over-the-counter, prescriptions, illicit drugs), all items previously/currently identified as a safety concern.  The family member/significant other verbalizes understanding of the suicide prevention education information provided.  The family member/significant other agrees to remove the items of safety concern listed above.  CSW connected with pt sister regarding any presenting concerns surrounding pt discharge. Sister expressed beliefs of admission not being long enough, proving receptive to CSW feedback regarding stabilization and improved presentation. Sister acknowledged means of keeping pt safe and confirmed pt has adequate support and consistent engagement from family within the home. Sister confirmed understanding of keeping environment safe from access  to firearms.   Lindsey Castillo 02/25/2021, 10:05 AM

## 2021-02-25 NOTE — Progress Notes (Signed)
  [facility] Adult Case Management Discharge Plan :  Will you be returning to the same living situation after discharge:  Yes,  home with family. At discharge, do you have transportation home?: Yes,  sister will transport pt at time of discharge. Do you have the ability to pay for your medications: Yes,  pt has active medical coverage.  Release of information consent forms completed and in the chart;  Patient's signature needed at discharge.  Patient to Follow up at:  Follow-up Information     Medicine, [facility]. Call on 03/10/2021.   Specialty: Family Medicine Why: You have an appointment for medication management services on 03/10/21 at 3:00 pm.  This appointment will virtual telehealth. Contact information: [address] [place] [address] [phone]         [facility], [facility] Follow up.   Why: Please go to this provider for therapy services during the walk in hours for new patients:  Monday through Friday from 9:00 am to 2:00 pm.  After the initial appointments, you may schedule Virtually. Contact information: [address] [place] [address] [phone]                 Next level of care provider has access to [facility] Link:no  Safety Planning and Suicide Prevention discussed: Yes,  SPE reviewed with Lindsey Castillo, Sister, [phone].  Have you used any form of tobacco in the last 30 days? (Cigarettes, Smokeless Tobacco, Cigars, and/or Pipes): Yes  Has patient been referred to the Quitline?: Patient refused referral  Patient has been referred for addiction treatment: Yes  Leisa Lenz, LCSW 02/25/2021, 10:18 AM

## 2021-02-25 NOTE — Progress Notes (Signed)
Discharge Note:   Patient discharged home with family.  Suicide prevention information given and discussed with patient who stated she understood and had no questions.  Patient stated she received all her belongings, clothing, misc items.   Patient denied SI and HI.  Denied A/V hallucinations.  Patient stated she appreciated all assistance received from [facility] staff.  All required discharge information given.  

## 2021-02-25 NOTE — Progress Notes (Signed)
D:  Patient sleeps good, no sleep medication.  Good appetite, normal energy level, good concentration.  Denied depression, hopeless and anxiety.  Withdrawals, tremors.  Denied SI.  Denied physical problems.  Denied physical pain.  Goal is discharge to family, son, find good therapist.  Discharge.  Does have discharge plans. A:  Medications administered per MD orders.  Emotional support and encouragement given patient. R:  Denied SI and HI, contracts for safety.  Denied A/V hallucinations.  Safety maintained with 15 minute checks.

## 2021-02-25 NOTE — Plan of Care (Signed)
Nurse discussed anxiety, depression and coping skills with patient.  

## 2021-02-25 NOTE — [facility] Group Notes (Signed)
[facility] Group Notes: (Nursing/MHT/Case Management/Adjunct)   Date:  02/25/2021  Time:  9:00 AM   Type of Therapy:  Goals group   Participation Level:  Did Not Attend   Participation Quality:     Affect:     Cognitive:     Insight:     Engagement in Group:     Modes of Intervention:  Discussion and Education   Summary of Progress/Problems:  Did not attend despite staff encouragement.     Bellami Farrelly V Chabeli Barsamian 02/25/2021 9:00 AM 

## 2021-07-21 IMAGING — CT CT RENAL STONE PROTOCOL
2 of 4 series · 15 of 46 positions shown, 17 images · non-contrast
Comparison: September 12, 2016

CLINICAL DATA: Left flank pain.

EXAM:
CT ABDOMEN AND PELVIS WITHOUT CONTRAST
TECHNIQUE: Multidetector CT imaging of the abdomen and pelvis was performed
following the standard protocol without IV contrast.

[Series 2: axial st · axial · 0.65mm/px · z∈[+1179,+1539]mm · 12 of 80 slices shown, 14 images]
[im 4/80  soft-tissue]
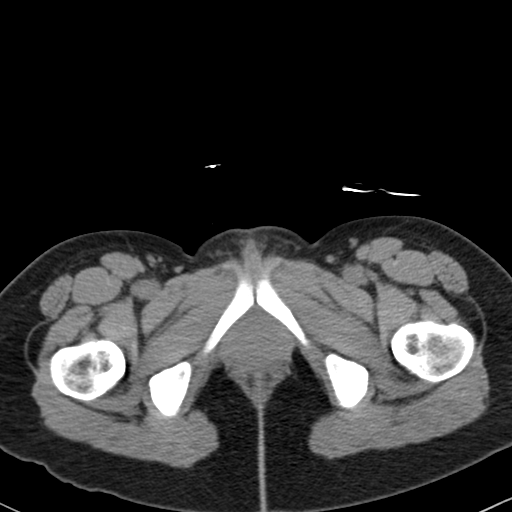
[im 4/80  bone]
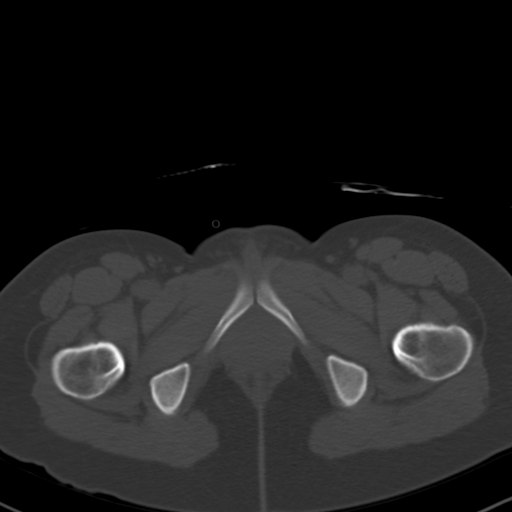
[im 12/80  soft-tissue]
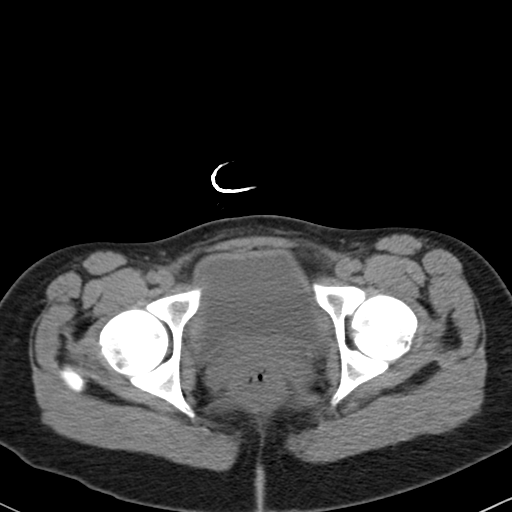
[im 19/80  soft-tissue]
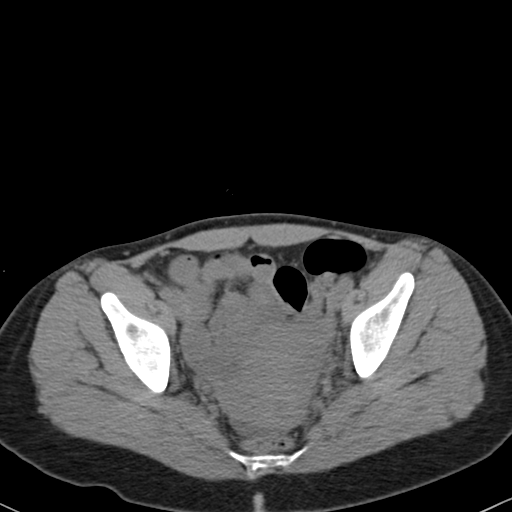
[im 23/80  soft-tissue]
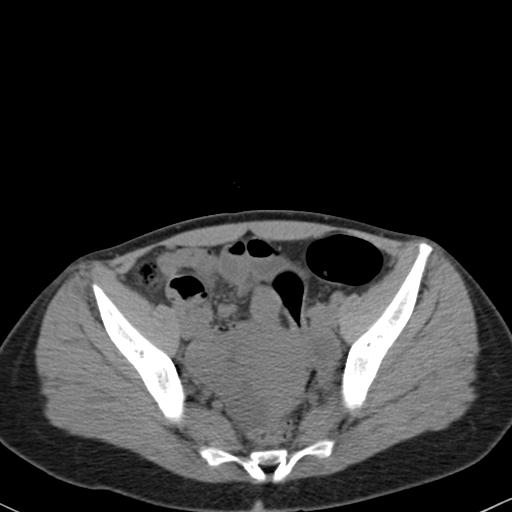
[im 31/80  soft-tissue]
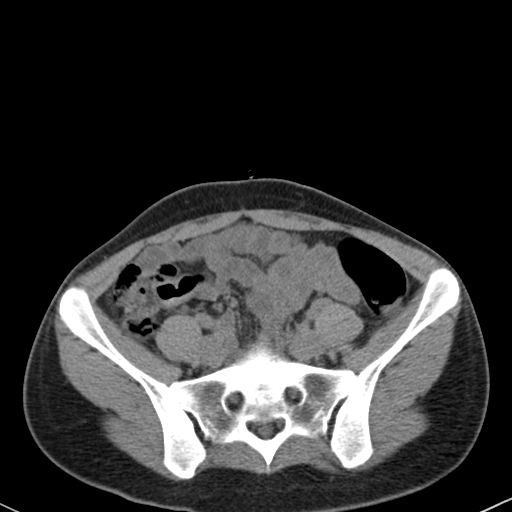
[im 38/80  soft-tissue]
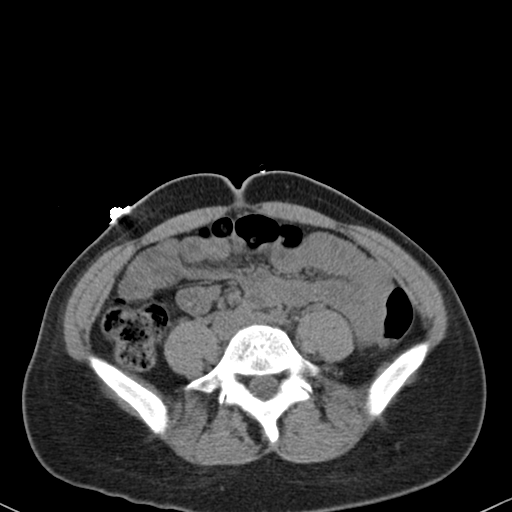
[im 42/80  soft-tissue]
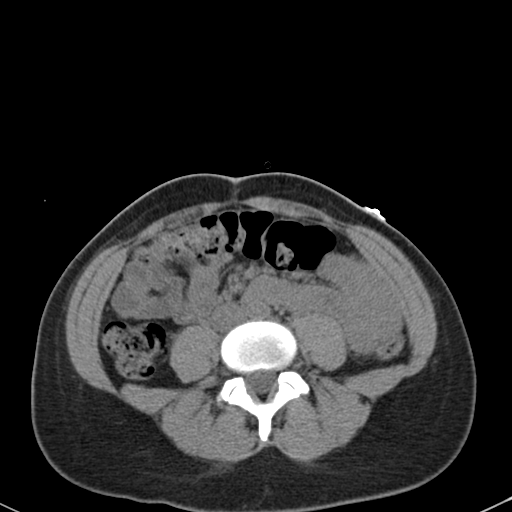
[im 49/80  soft-tissue]
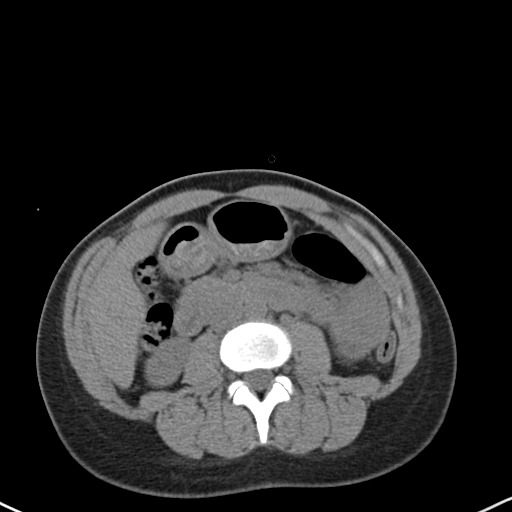
[im 57/80  soft-tissue]
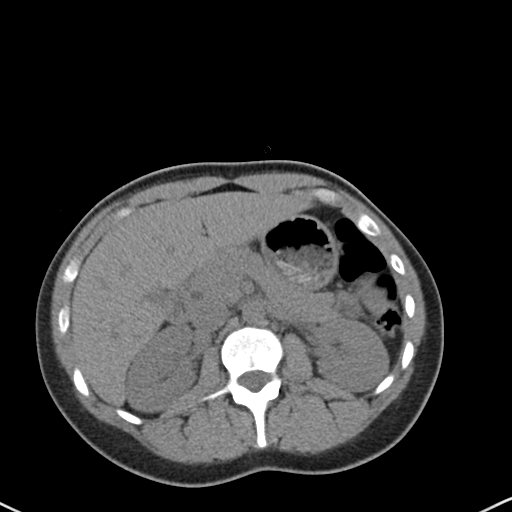
[im 57/80  bone]
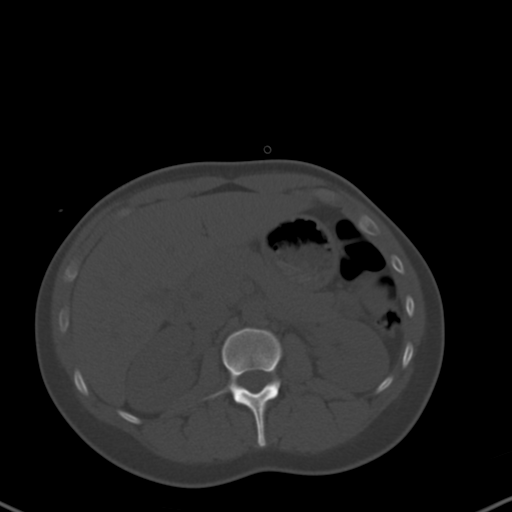
[im 61/80  soft-tissue]
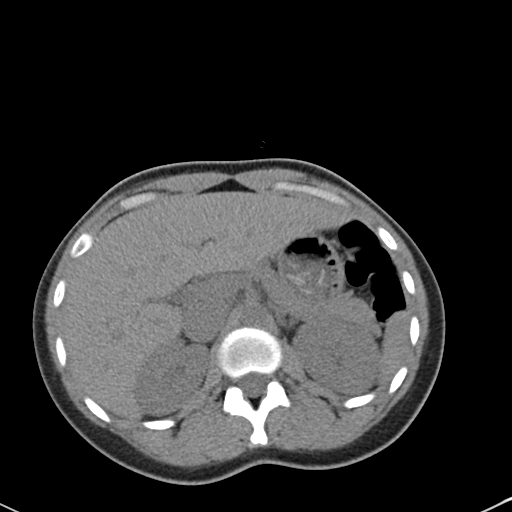
[im 68/80  soft-tissue]
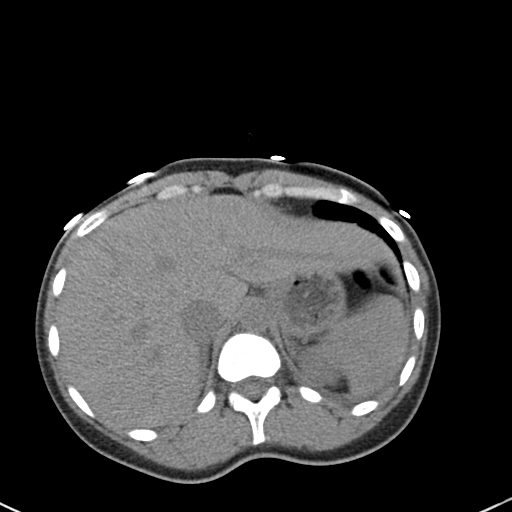
[im 76/80  soft-tissue]
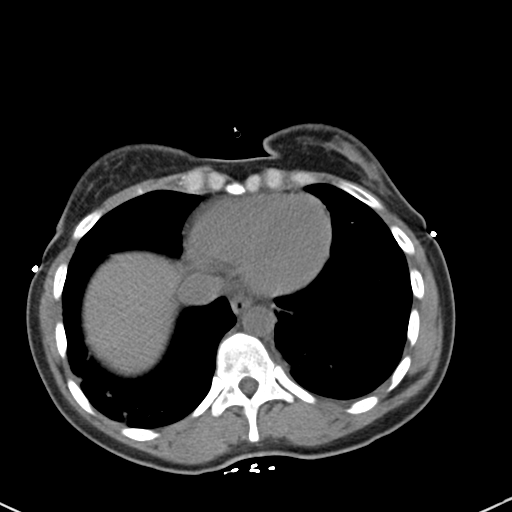

[Series 4: coronal · coronal · 0.55mm/px · 3 of 112 slices shown]
[im 38/112  soft-tissue]
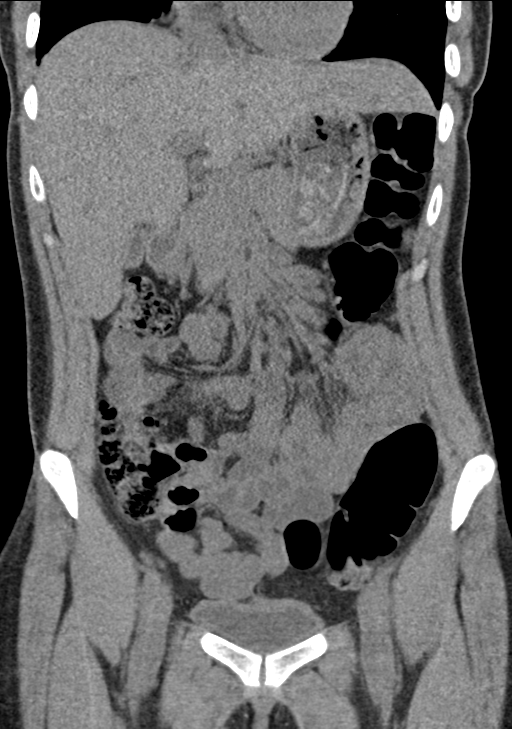
[im 50/112  soft-tissue]
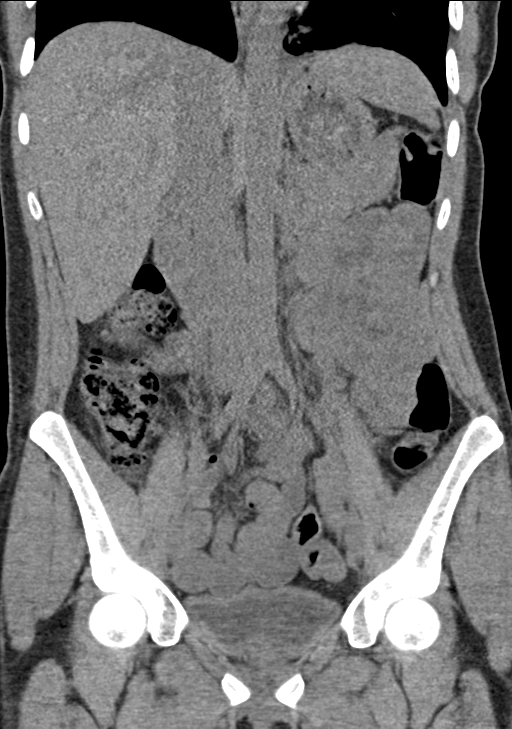
[im 62/112  soft-tissue]
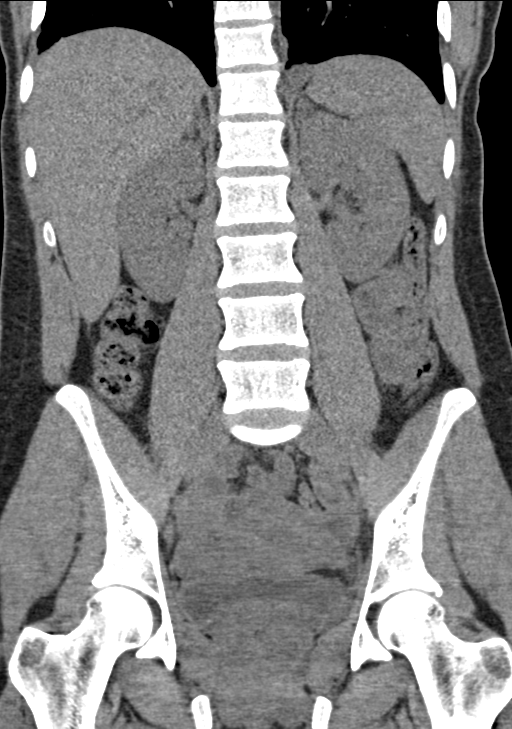

[15 of 46 positions shown; findings below may reference images not displayed]

FINDINGS: Lower chest: There is an area of consolidation at the left lung base
concerning for pneumonia. There is atelectasis and scarring at the
right lung base.The heart size is normal. The intracardiac blood
pool is hypodense relative to the adjacent myocardium consistent
with anemia.

Hepatobiliary: The liver is normal. Normal gallbladder.There is no
biliary ductal dilation.

Pancreas: Normal contours without ductal dilatation. No
peripancreatic fluid collection.

Spleen: Unremarkable.

Adrenals/Urinary Tract:

--Adrenal glands: Unremarkable.

--Right kidney/ureter: No hydronephrosis or radiopaque kidney
stones.

--Left kidney/ureter: No hydronephrosis or radiopaque kidney stones.

--Urinary bladder: Unremarkable.

Stomach/Bowel:

--Stomach/Duodenum: No hiatal hernia or other gastric abnormality.
Normal duodenal course and caliber.

--Small bowel: Unremarkable.

--Colon: Unremarkable.

--Appendix: Normal.

Vascular/Lymphatic: Normal course and caliber of the major abdominal
vessels.

--No retroperitoneal lymphadenopathy.

--No mesenteric lymphadenopathy.

--No pelvic or inguinal lymphadenopathy.

Reproductive: Unremarkable

Other: There is a small volume of pelvic free fluid which is likely
physiologic. No free air. The abdominal wall is normal.

Musculoskeletal. No acute displaced fractures.
IMPRESSION: 1. No acute abdominopelvic abnormality.
2. Left lower lobe consolidation concerning for pneumonia.

## 2022-05-20 ENCOUNTER — Telehealth: Payer: Medicaid Other | Admitting: Family Medicine

## 2022-05-20 DIAGNOSIS — L239 Allergic contact dermatitis, unspecified cause: Secondary | ICD-10-CM | POA: Diagnosis not present

## 2022-05-20 DIAGNOSIS — N3000 Acute cystitis without hematuria: Secondary | ICD-10-CM

## 2022-05-20 MED ORDER — PREDNISONE 20 MG PO TABS: 40.0000 mg | ORAL_TABLET | Freq: Every day | ORAL | 0 refills | Status: AC

## 2022-05-20 MED ORDER — SULFAMETHOXAZOLE-TRIMETHOPRIM 800-160 MG PO TABS: 1.0000 | ORAL_TABLET | Freq: Two times a day (BID) | ORAL | 0 refills | Status: AC

## 2022-05-20 NOTE — Progress Notes (Signed)
Virtual Visit Consent   Lindsey Castillo, you are scheduled for a virtual visit with a [facility] provider today. Just as with appointments in the office, your consent must be obtained to participate. Your consent will be active for this visit and any virtual visit you may have with one of our providers in the next 365 days. If you have a MyChart account, a copy of this consent can be sent to you electronically.  As this is a virtual visit, video technology does not allow for your provider to perform a traditional examination. This may limit your provider's ability to fully assess your condition. If your provider identifies any concerns that need to be evaluated in person or the need to arrange testing (such as labs, EKG, etc.), we will make arrangements to do so. Although advances in technology are sophisticated, we cannot ensure that it will always work on either your end or our end. If the connection with a video visit is poor, the visit may have to be switched to a telephone visit. With either a video or telephone visit, we are not always able to ensure that we have a secure connection.  By engaging in this virtual visit, you consent to the provision of healthcare and authorize for your insurance to be billed (if applicable) for the services provided during this visit. Depending on your insurance coverage, you may receive a charge related to this service.  I need to obtain your verbal consent now. Are you willing to proceed with your visit today? Lindsey Castillo has provided verbal consent on 05/20/2022 for a virtual visit (video or telephone). Georgana Curio, FNP  Date: 05/20/2022 10:54 AM  Virtual Visit via Video Note   I, Georgana Curio, connected with  Lindsey Castillo  ([id number], 1991-02-17) on 05/20/22 at 10:45 AM EDT by a video-enabled telemedicine application and verified that I am speaking with the correct person using two identifiers.  Location: Patient: Virtual Visit Location Patient: Other: parked  car Provider: Virtual Visit Location Provider: Home Office   I discussed the limitations of evaluation and management by telemedicine and the availability of in person appointments. The patient expressed understanding and agreed to proceed.    History of Present Illness: Lindsey Castillo is a 31 y.o. who identifies as a female who was assigned female at birth, and is being seen today for concentrated urine. On [employer] preemployment physical she was told she had a UTI. No abd pain, fever, nausea or back pain. No dysuria. Also has a rash on her arms. She says he son had the same rash last week and prednisone cleared his rash. Hers itches and is only on arms. Marland Kitchen  HPI: HPI  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   Anemia 04/03/2017   Tobacco abuse 04/03/2017   Vaginal discharge 04/03/2017   Gonorrhea in female 09/14/2016   Colitis, acute 09/14/2016   SIRS (systemic inflammatory response syndrome) (HCC) 09/12/2016   Right lower quadrant abdominal pain 09/12/2016   NSVD (normal spontaneous vaginal delivery) 01/01/2013   Asthma 08/02/2012   Eczema 08/02/2012    Allergies:  Allergies  Allergen Reactions   Other     Coconut   Medications:  Current Outpatient Medications:    predniSONE (DELTASONE) 20 MG tablet, Take 2 tablets (40 mg total) by mouth daily with breakfast for 5 days., Disp: 10  tablet, Rfl: 0   sulfamethoxazole-trimethoprim (BACTRIM DS) 800-160 MG tablet, Take 1 tablet by mouth 2 (two) times daily for 5 days., Disp: 10 tablet, Rfl: 0   albuterol (VENTOLIN HFA) 108 (90 Base) MCG/ACT inhaler, Inhale into the lungs every 6 (six) hours as needed for wheezing or shortness of breath., Disp: , Rfl:    cetirizine (ZYRTEC) 10 MG tablet, Take 1 tablet by mouth daily., Disp: , Rfl:    escitalopram (LEXAPRO) 20 MG tablet, Take 1 tablet by mouth  daily., Disp: , Rfl:    fluticasone (FLONASE) 50 MCG/ACT nasal spray, Place 2 sprays into both nostrils daily., Disp: 16 mL, Rfl: 0   hydrOXYzine (ATARAX/VISTARIL) 25 MG tablet, Take 1 tablet (25 mg total) by mouth every 6 (six) hours as needed for anxiety (anxiety/agitation or CIWA < or = 10)., Disp: 30 tablet, Rfl: 0  Observations/Objective: Patient is well-developed, well-nourished in no acute distress.  Resting comfortably in parked car.  Head is normocephalic, atraumatic.  No labored breathing.  Speech is clear and coherent with logical content.  Patient is alert and oriented at baseline.    Assessment and Plan: 1. Acute cystitis without hematuria  2. Allergic contact dermatitis, unspecified trigger  Benadryl, hydrocortisone, push fluids, urgent care if sx persist or worsen.   Follow Up Instructions: I discussed the assessment and treatment plan with the patient. The patient was provided an opportunity to ask questions and all were answered. The patient agreed with the plan and demonstrated an understanding of the instructions.  A copy of instructions were sent to the patient via MyChart unless otherwise noted below.     The patient was advised to call back or seek an in-person evaluation if the symptoms worsen or if the condition fails to improve as anticipated.  Time:  I spent 10 minutes with the patient via telehealth technology discussing the above problems/concerns.    Georgana Curio, FNP

## 2022-05-20 NOTE — Patient Instructions (Signed)
Contact Dermatitis Dermatitis is redness, soreness, and swelling (inflammation) of the skin. Contact dermatitis is a reaction to something that touches the skin. There are two types of contact dermatitis: Irritant contact dermatitis. This happens when something bothers (irritates) your skin, like soap. Allergic contact dermatitis. This is caused when you are exposed to something that you are allergic to, such as poison ivy. What are the causes? Common causes of irritant contact dermatitis include: Makeup. Soaps. Detergents. Bleaches. Acids. Metals, such as nickel. Common causes of allergic contact dermatitis include: Plants. Chemicals. Jewelry. Latex. Medicines. Preservatives in products, such as clothing. What increases the risk? Having a job that exposes you to things that bother your skin. Having asthma or eczema. What are the signs or symptoms? Symptoms may happen anywhere the irritant has touched your skin. Symptoms include: Dry or flaky skin. Redness. Cracks. Itching. Pain or a burning feeling. Blisters. Blood or clear fluid draining from skin cracks. With allergic contact dermatitis, swelling may occur. This may happen in places such as the eyelids, mouth, or genitals. How is this treated? This condition is treated by checking for the cause of the reaction and protecting your skin. Treatment may also include: Steroid creams, ointments, or medicines. Antibiotic medicines or other ointments, if you have a skin infection. Lotion or medicines to help with itching. A bandage (dressing). Follow these instructions at home: Skin care Moisturize your skin as needed. Put cool cloths on your skin. Put a baking soda paste on your skin. Stir water into baking soda until it looks like a paste. Do not scratch your skin. Avoid having things rub up against your skin. Avoid the use of soaps, perfumes, and dyes. Medicines Take or apply over-the-counter and prescription medicines  only as told by your doctor. If you were prescribed an antibiotic medicine, take or apply it as told by your doctor. Do not stop using it even if your condition starts to get better. Bathing Take a bath with: Epsom salts. Baking soda. Colloidal oatmeal. Bathe less often. Bathe in warm water. Avoid using hot water. Bandage care If you were given a bandage, change it as told by your doctor. Wash your hands with soap and water before and after you change your bandage. If soap and water are not available, use hand sanitizer. General instructions Avoid the things that caused your reaction. If you do not know what caused it, keep a journal. Write down: What you eat. What skin products you use. What you drink. What you wear in the area that has symptoms. This includes jewelry. Check the affected areas every day for signs of infection. Check for: More redness, swelling, or pain. More fluid or blood. Warmth. Pus or a bad smell. Keep all follow-up visits as told by your doctor. This is important. Contact a doctor if: You do not get better with treatment. Your condition gets worse. You have signs of infection, such as: More swelling. Tenderness. More redness. Soreness. Warmth. You have a fever. You have new symptoms. Get help right away if: You have a very bad headache. You have neck pain. Your neck is stiff. You throw up (vomit). You feel very sleepy. You see red streaks coming from the area. Your bone or joint near the area hurts after the skin has healed. The area turns darker. You have trouble breathing. Summary Dermatitis is redness, soreness, and swelling of the skin. Symptoms may occur where the irritant has touched you. Treatment may include medicines and skin care. If you do not  know what caused your reaction, keep a journal. Contact a doctor if your condition gets worse or you have signs of infection. This information is not intended to replace advice given to you by  your health care provider. Make sure you discuss any questions you have with your health care provider. Document Revised: 06/14/2021 Document Reviewed: 06/14/2021 Elsevier Patient Education  2023 [employer] Asymptomatic Bacteriuria Asymptomatic bacteriuria is the presence of a large number of bacteria in the urine without the usual symptoms of burning or frequent urination. This is usually not harmful, and treatment may not be needed. A person with this condition will not be more likely to develop an infection in the future. What are the causes? This condition is caused by an increase in bacteria in the urine. This increase can be caused by: Bacteria entering the urinary tract, such as during sex. A blockage in the urinary tract, such as from kidney stones or a tumor. Bladder problems that prevent the bladder from emptying. What increases the risk? You are more likely to develop this condition if: You have diabetes. You are an older adult. This especially affects older adults in long-term care facilities. You are pregnant and in the first trimester. You have kidney stones. You are female. You have had a kidney transplant. You have a leaky kidney tube valve (reflux). You had a urinary catheter for a long period of time. This is a long, thin tube that collects urine. What are the signs or symptoms? There are no symptoms of this condition. How is this diagnosed? This condition is diagnosed with a urine test. Because this condition does not cause symptoms, it is usually diagnosed when a urine sample is taken to treat or diagnose another condition, such as pregnancy or kidney problems. Most women who are in their first trimester of pregnancy are screened for asymptomatic bacteriuria. How is this treated? Usually, treatment is not needed for this condition. Treating the condition can lead to other problems, such as a yeast infection or the growth of bacteria that do not respond to treatment  (antibiotic-resistant bacteria). Some people do need treatment with antibiotic medicines to prevent kidney infection, known as pyelonephritis. Treatment is needed if: You are pregnant. In pregnant women, kidney infection can lead to: Early labor (premature labor). Very low birth weight (fetal growth restriction). Newborn death. You are having a procedure that affects the urinary tract. You have had a kidney transplant. If you are diagnosed with this condition, talk with your health care provider about any concerns that you have. Follow these instructions at home: Medicines Take over-the-counter and prescription medicines only as told by your health care provider. If you were prescribed an antibiotic medicine, take it as told by your health care provider. Do not stop using the antibiotic even if you start to feel better. General instructions Monitor your condition for any changes. Drink enough fluid to keep your urine pale yellow. Urinate more often to keep your bladder empty. If you are female, keep the area around your vagina and rectum clean. Wipe from front to back after urinating or having a bowel movement. Use each piece of toilet paper only once. Keep all follow-up visits. This is important. Contact a health care provider if: You have symptoms of a urine infection, such as: A burning sensation, or pain when you urinate. A strong need to urinate, or urinating more often. Urine turning discolored or cloudy. Blood in your urine. Urine that smells bad. Get help right away if: You  develop signs of a kidney infection, such as: Back pain or pelvic pain. A fever or chills. Nausea or vomiting. Severe pain that cannot be controlled with medicine. Summary Asymptomatic bacteriuria is the presence of a large number of bacteria in the urine without the usual symptoms of burning or frequent urination. Usually, treatment is not needed for this condition. Treating the condition can lead to  other problems, such as a yeast infection or the growth of bacteria that do not respond to treatment. Some people do need treatment. Treatment is needed if you are pregnant, if you are having a procedure that affects the urinary tract, or if you have had a kidney transplant. If you were prescribed an antibiotic medicine, take it as told by your health care provider. Do not stop using the antibiotic even if you start to feel better. This information is not intended to replace advice given to you by your health care provider. Make sure you discuss any questions you have with your health care provider. Document Revised: 04/10/2020 Document Reviewed: 04/10/2020 Elsevier Patient Education  2023 [employer].

## 2022-12-29 ENCOUNTER — Ambulatory Visit
Admission: EM | Admit: 2022-12-29 | Discharge: 2022-12-29 | Disposition: A | Discharge status: Home / Self Care | Payer: Medicaid Other | Attending: Urgent Care | Admitting: Urgent Care

## 2022-12-29 ENCOUNTER — Ambulatory Visit: Admission: EM | Admit: 2022-12-29 | Payer: Medicaid Other | Source: Home / Self Care

## 2022-12-29 ENCOUNTER — Ambulatory Visit (INDEPENDENT_AMBULATORY_CARE_PROVIDER_SITE_OTHER): Payer: Medicaid Other

## 2022-12-29 DIAGNOSIS — J209 Acute bronchitis, unspecified: Secondary | ICD-10-CM

## 2022-12-29 DIAGNOSIS — J4530 Mild persistent asthma, uncomplicated: Secondary | ICD-10-CM

## 2022-12-29 DIAGNOSIS — J309 Allergic rhinitis, unspecified: Secondary | ICD-10-CM

## 2022-12-29 MED ORDER — PREDNISONE 50 MG PO TABS: 50.0000 mg | ORAL_TABLET | Freq: Every day | ORAL | 0 refills | Status: DC

## 2022-12-29 MED ORDER — FLUTICASONE PROPIONATE 50 MCG/ACT NA SUSP: 2.0000 | Freq: Every day | NASAL | 0 refills | Status: DC

## 2022-12-29 MED ORDER — ALBUTEROL SULFATE HFA 108 (90 BASE) MCG/ACT IN AERS: 1.0000 | INHALATION_SPRAY | Freq: Four times a day (QID) | RESPIRATORY_TRACT | 0 refills | Status: DC | PRN

## 2022-12-29 MED ORDER — CETIRIZINE HCL 10 MG PO TABS: 10.0000 mg | ORAL_TABLET | Freq: Every day | ORAL | 0 refills | Status: DC

## 2022-12-29 NOTE — ED Triage Notes (Signed)
Pt reports sharp pain on R side of chest that radiates under arm into back that began today after sneezing around 10am. Pt reports yesterday she had a weird sensation in chest that resolved. Pain is worse with inspiration, coughing, and sneezing. Endorses some light-headedness.

## 2022-12-29 NOTE — ED Provider Notes (Signed)
[facility]  Note:  This document was prepared using Conservation officer, historic buildings and may include unintentional dictation errors.  MRN: [id number] DOB: Jan 26, 1991  Subjective:   Lindsey Castillo is a 32 y.o. female presenting for 1 day history of acute onset persistent sneezing, coughing, right-sided chest pain and chest tightness.  This made her feel lightheaded but this resolved.  She is having pleuritic pain with deep breathing and coughing. Smokes 1ppd. History of asthma, does not have an inhaler. Has allergies, does not have her Zyrtec and Flonase.  Needs refill on those medications.  No current facility-administered medications for this encounter.  Current Outpatient Medications:    albuterol (VENTOLIN HFA) 108 (90 Base) MCG/ACT inhaler, Inhale into the lungs every 6 (six) hours as needed for wheezing or shortness of breath., Disp: , Rfl:    cetirizine (ZYRTEC) 10 MG tablet, Take 1 tablet by mouth daily., Disp: , Rfl:    escitalopram (LEXAPRO) 20 MG tablet, Take 1 tablet by mouth daily., Disp: , Rfl:    fluticasone (FLONASE) 50 MCG/ACT nasal spray, Place 2 sprays into both nostrils daily., Disp: 16 mL, Rfl: 0   hydrOXYzine (ATARAX/VISTARIL) 25 MG tablet, Take 1 tablet (25 mg total) by mouth every 6 (six) hours as needed for anxiety (anxiety/agitation or CIWA < or = 10)., Disp: 30 tablet, Rfl: 0   Allergies  Allergen Reactions   Other     Coconut    Past Medical History:  Diagnosis Date   Abnormal Pap smear    last pap 07/2012   Allergy    Anemia    Anxiety    Asthma    USES INHALER   Depression    Eczema    Fracture, ankle AGE 53   Headache(784.0)    Infection    trich   Infection    BV   Infection    HX OF FREQUENT   NSVD (normal spontaneous vaginal delivery) 01/01/2013   SVD at 1525   Seasonal allergies      Past Surgical History:  Procedure Laterality Date   FOOT SURGERY Right    "15 splinters in my foot"    Family History   Problem Relation Age of Onset   Hypertension Father    Heart disease Father    Thyroid disease Father    Alcoholism Father    Asthma Father    Cancer Maternal Grandmother    Diabetes Maternal Grandmother    Kidney disease Maternal Grandmother    Arthritis Maternal Grandmother    Hypertension Maternal Grandmother    Depression Mother    Mental illness Mother    Birth defects Sister        HEART MURMUR   Asthma Brother    Miscarriages / [place] Cousin    Thyroid disease Sister     Social History   Tobacco Use   Smoking status: Every Day    Packs/day: 1.00    Years: 11.00    Additional pack years: 0.00    Total pack years: 11.00    Types: Cigarettes    Last attempt to quit: 05/24/2012    Years since quitting: 10.6   Smokeless tobacco: Never  Vaping Use   Vaping Use: Never used  Substance Use Topics   Alcohol use: Yes    Alcohol/week: 56.0 standard drinks of alcohol    Types: 56 Cans of beer per week    Comment: drinks (4) 24-oz beers a day   Drug use:  Not Currently    ROS   Objective:   Vitals: BP 109/79 (BP Location: Left Arm)   Pulse 72   Temp 97.9 F (36.6 C) (Oral)   Resp (!) 22   LMP 12/26/2022 (Exact Date)   SpO2 99%   Physical Exam Constitutional:      General: She is not in acute distress.    Appearance: Normal appearance. She is well-developed and normal weight. She is not ill-appearing, toxic-appearing or diaphoretic.  HENT:     Head: Normocephalic and atraumatic.     Right Ear: Tympanic membrane, ear canal and external ear normal. No drainage or tenderness. No middle ear effusion. There is no impacted cerumen. Tympanic membrane is not erythematous or bulging.     Left Ear: Tympanic membrane, ear canal and external ear normal. No drainage or tenderness.  No middle ear effusion. There is no impacted cerumen. Tympanic membrane is not erythematous or bulging.     Nose: Nose normal. No congestion or rhinorrhea.     Mouth/Throat:     Mouth:  Mucous membranes are moist. No oral lesions.     Pharynx: No pharyngeal swelling, oropharyngeal exudate, posterior oropharyngeal erythema or uvula swelling.     Tonsils: No tonsillar exudate or tonsillar abscesses.  Eyes:     General: No scleral icterus.       Right eye: No discharge.        Left eye: No discharge.     Extraocular Movements: Extraocular movements intact.     Right eye: Normal extraocular motion.     Left eye: Normal extraocular motion.     Conjunctiva/sclera: Conjunctivae normal.  Cardiovascular:     Rate and Rhythm: Normal rate and regular rhythm.     Heart sounds: Normal heart sounds. No murmur heard.    No friction rub. No gallop.  Pulmonary:     Effort: Pulmonary effort is normal. No respiratory distress.     Breath sounds: No stridor. No wheezing, rhonchi or rales.  Chest:     Chest wall: No tenderness.  Musculoskeletal:     Cervical back: Normal range of motion and neck supple.  Lymphadenopathy:     Cervical: No cervical adenopathy.  Skin:    General: Skin is warm and dry.  Neurological:     General: No focal deficit present.     Mental Status: She is alert and oriented to person, place, and time.  Psychiatric:        Mood and Affect: Mood normal.        Behavior: Behavior normal.      DG Chest 2 View  Result Date: 12/29/2022 CLINICAL DATA:  Chest pain. Right-sided pain. Pain onset today after sneezing. EXAM: CHEST - 2 VIEW; RIGHT RIBS - 2 VIEW COMPARISON:  Chest radiograph 02/12/2020 FINDINGS: Chest:The cardiomediastinal contours are normal. The lungs are clear. Pulmonary vasculature is normal. No consolidation, pleural effusion, or pneumothorax. No acute osseous abnormalities are seen. Right ribs: The cortical margins of the right ribs are intact. There is no rib fracture or focal bone abnormality. No bony destructive change. IMPRESSION: Negative radiographs of the chest and right ribs.  No rib fracture. Electronically Signed   By: Narda Rutherford M.D.    On: 12/29/2022 13:57   DG Ribs Unilateral Right  Result Date: 12/29/2022 CLINICAL DATA:  Chest pain. Right-sided pain. Pain onset today after sneezing. EXAM: CHEST - 2 VIEW; RIGHT RIBS - 2 VIEW COMPARISON:  Chest radiograph 02/12/2020 FINDINGS: Chest:The cardiomediastinal contours are normal.  The lungs are clear. Pulmonary vasculature is normal. No consolidation, pleural effusion, or pneumothorax. No acute osseous abnormalities are seen. Right ribs: The cortical margins of the right ribs are intact. There is no rib fracture or focal bone abnormality. No bony destructive change. IMPRESSION: Negative radiographs of the chest and right ribs.  No rib fracture. Electronically Signed   By: Narda Rutherford M.D.   On: 12/29/2022 13:57     Assessment and Plan :   PDMP not reviewed this encounter.  1. Acute bronchitis, unspecified organism   2. Allergic rhinitis, unspecified seasonality, unspecified trigger   3. Mild persistent asthma without complication     Recommended an oral prednisone course for her respiratory symptoms, chest symptoms.  Advised supportive care for acute bronchitis.  Refilled her albuterol inhaler, Flonase and Zyrtec.  Counseled patient on potential for adverse effects with medications prescribed/recommended today, ER and return-to-clinic precautions discussed, patient verbalized understanding.    Wallis Bamberg, PA-C 12/29/22 1414

## 2022-12-29 NOTE — ED Triage Notes (Signed)
Pt reports sharp pain on R side of chest that radiates under arm into back that began today after sneezing around 10am. Pt reports yesterday she had a weird sensation in chest that resolved. Pain is worse with inspiration, coughing, and sneezing. Endorses some light-headedness.  

## 2023-03-10 ENCOUNTER — Ambulatory Visit: Payer: Medicaid Other | Admitting: Obstetrics & Gynecology

## 2023-03-27 ENCOUNTER — Ambulatory Visit
Admission: RE | Admit: 2023-03-27 | Discharge: 2023-03-27 | Disposition: A | Discharge status: Home / Self Care | Payer: Medicaid Other | Source: Ambulatory Visit | Attending: Family Medicine | Admitting: Family Medicine

## 2023-03-27 VITALS — BP 116/83 | HR 84 | Temp 98.9°F | Resp 18

## 2023-03-27 DIAGNOSIS — Z113 Encounter for screening for infections with a predominantly sexual mode of transmission: Secondary | ICD-10-CM | POA: Diagnosis not present

## 2023-03-27 DIAGNOSIS — Z8619 Personal history of other infectious and parasitic diseases: Secondary | ICD-10-CM | POA: Insufficient documentation

## 2023-03-27 DIAGNOSIS — F17210 Nicotine dependence, cigarettes, uncomplicated: Secondary | ICD-10-CM | POA: Diagnosis not present

## 2023-03-27 DIAGNOSIS — J3089 Other allergic rhinitis: Secondary | ICD-10-CM

## 2023-03-27 DIAGNOSIS — Z9189 Other specified personal risk factors, not elsewhere classified: Secondary | ICD-10-CM

## 2023-03-27 DIAGNOSIS — J029 Acute pharyngitis, unspecified: Secondary | ICD-10-CM

## 2023-03-27 MED ORDER — PREDNISONE 50 MG PO TABS: ORAL_TABLET | ORAL | 0 refills | Status: DC

## 2023-03-27 NOTE — Discharge Instructions (Signed)
Continue allergy medicines:  flonase daily and cetirizine every day Add prednisone as directed Consider humidifier in bedroom Check my Chart for swab results See primary care if problem persists

## 2023-03-27 NOTE — ED Provider Notes (Signed)
[facility]    CSN: [id number] Arrival date & time: 03/27/23  1422      History   Chief Complaint Chief Complaint  Patient presents with   Sore Throat    Entered by patient    HPI Lindsey Castillo is a 32 y.o. female.   HPI  Patient is here for two problems Sore throat for a month.  Worse in morning.  Admits to allergies, congestion ans PND not well controlled with zyrtec and Flonase.  Admits to skipping doses.  No fever or chills or headache/body acnes.  Complains her tonsil stones bother her Unprotected sex with new partner.  Wants STD testing.  I s not symptomatic  Past Medical History:  Diagnosis Date   Abnormal Pap smear    last pap 07/2012   Allergy    Anemia    Anxiety    Asthma    USES INHALER   Depression    Eczema    Fracture, ankle AGE 73   Headache(784.0)    Infection    trich   Infection    BV   Infection    HX OF FREQUENT   NSVD (normal spontaneous vaginal delivery) 01/01/2013   SVD at 1525   Seasonal allergies     Patient Active Problem List   Diagnosis Date Noted   Major depressive disorder, recurrent episode (HCC) 02/24/2021   Suicidal overdose (HCC) 02/23/2021   Rhabdomyolysis 04/03/2017   Anemia 04/03/2017   Tobacco abuse 04/03/2017   Vaginal discharge 04/03/2017   Gonorrhea in female 09/14/2016   Colitis, acute 09/14/2016   SIRS (systemic inflammatory response syndrome) (HCC) 09/12/2016   Right lower quadrant abdominal pain 09/12/2016   NSVD (normal spontaneous vaginal delivery) 01/01/2013   Asthma 08/02/2012   Eczema 08/02/2012    Past Surgical History:  Procedure Laterality Date   FOOT SURGERY Right    "15 splinters in my foot"    OB History     Gravida  1   Para  1   Term  1   Preterm      AB      Living  1      SAB      IAB      Ectopic      Multiple      Live Births  1            Home Medications    Prior to Admission medications   Medication Sig Start Date End Date Taking?  Authorizing Provider  albuterol (VENTOLIN HFA) 108 (90 Base) MCG/ACT inhaler Inhale 1-2 puffs into the lungs every 6 (six) hours as needed for wheezing or shortness of breath. 12/29/22  Yes Wallis Bamberg, PA-C  cetirizine (ZYRTEC) 10 MG tablet Take 1 tablet (10 mg total) by mouth daily. 12/29/22  Yes Wallis Bamberg, PA-C  escitalopram (LEXAPRO) 20 MG tablet Take 1 tablet by mouth daily. 01/11/21  Yes [provider]  fluticasone (FLONASE) 50 MCG/ACT nasal spray Place 2 sprays into both nostrils daily. 12/29/22  Yes Wallis Bamberg, PA-C  predniSONE (DELTASONE) 50 MG tablet Take once a day for 5 days.  Take with food 03/27/23  Yes Eustace Moore, MD    Family History Family History  Problem Relation Age of Onset   Hypertension Father    Heart disease Father    Thyroid disease Father    Alcoholism Father    Asthma Father    Cancer Maternal Grandmother    Diabetes Maternal Grandmother  Kidney disease Maternal Grandmother    Arthritis Maternal Grandmother    Hypertension Maternal Grandmother    Depression Mother    Mental illness Mother    Birth defects Sister        HEART MURMUR   Asthma Brother    Miscarriages / [place] Cousin    Thyroid disease Sister     Social History Social History   Tobacco Use   Smoking status: Every Day    Current packs/day: 0.00    Average packs/day: 1 pack/day for 11.0 years (11.0 ttl pk-yrs)    Types: Cigarettes    Start date: 05/24/2001    Last attempt to quit: 05/24/2012    Years since quitting: 10.8   Smokeless tobacco: Never  Vaping Use   Vaping status: Never Used  Substance Use Topics   Alcohol use: Yes    Alcohol/week: 56.0 standard drinks of alcohol    Types: 56 Cans of beer per week    Comment: drinks (4) 24-oz beers a day   Drug use: Not Currently     Allergies   Other   Review of Systems Review of Systems See HPI  Physical Exam Triage Vital Signs ED Triage Vitals  Encounter Vitals Group     BP 03/27/23 1434 116/83      Systolic BP Percentile --      Diastolic BP Percentile --      Pulse Rate 03/27/23 1434 84     Resp 03/27/23 1434 18     Temp 03/27/23 1434 98.9 F (37.2 C)     Temp Source 03/27/23 1434 Oral     SpO2 03/27/23 1434 99 %     Weight --      Height --      Head Circumference --      Peak Flow --      Pain Score 03/27/23 1436 0     Pain Loc --      Pain Education --      Exclude from Growth Chart --    No data found.  Updated Vital Signs BP 116/83 (BP Location: Right Arm)   Pulse 84   Temp 98.9 F (37.2 C) (Oral)   Resp 18   LMP 03/16/2023   SpO2 99%       Physical Exam Constitutional:      General: She is not in acute distress.    Appearance: She is well-developed. She is not ill-appearing.  HENT:     Head: Normocephalic and atraumatic.     Right Ear: Tympanic membrane and ear canal normal. No drainage. Tympanic membrane is not erythematous.     Left Ear: Tympanic membrane and ear canal normal. Drainage present. Tympanic membrane is not erythematous.     Nose: Congestion present. No rhinorrhea.     Mouth/Throat:     Mouth: Mucous membranes are moist.     Pharynx: No pharyngeal swelling or posterior oropharyngeal erythema.     Tonsils: No tonsillar exudate or tonsillar abscesses. 2+ on the right. 2+ on the left.     Comments: Tonsil stone on left Eyes:     Conjunctiva/sclera: Conjunctivae normal.     Pupils: Pupils are equal, round, and reactive to light.  Cardiovascular:     Rate and Rhythm: Normal rate.  Pulmonary:     Effort: Pulmonary effort is normal. No respiratory distress.  Abdominal:     General: There is no distension.     Palpations: Abdomen is soft.  Tenderness: There is no abdominal tenderness.     Comments: No epigastric tenderness  Musculoskeletal:        General: Normal range of motion.     Cervical back: Normal range of motion.  Skin:    General: Skin is warm and dry.  Neurological:     Mental Status: She is alert.      UC  Treatments / Results  Labs (all labs ordered are listed, but only abnormal results are displayed) Labs Reviewed  CERVICOVAGINAL ANCILLARY ONLY    EKG   Radiology No results found.  Procedures Procedures (including critical care time)  Medications Ordered in UC Medications - No data to display  Initial Impression / Assessment and Plan / UC Course  I have reviewed the triage vital signs and the nursing notes.  Pertinent labs & imaging results that were available during my care of the patient were reviewed by me and considered in my medical decision making (see chart for details).    Final Clinical Impressions(s) / UC Diagnoses   Final diagnoses:  Pharyngitis, unspecified etiology  Environmental and seasonal allergies  At risk for sexually transmitted disease due to unprotected sex     Discharge Instructions      Continue allergy medicines:  flonase daily and cetirizine every day Add prednisone as directed Consider humidifier in bedroom Check my Chart for swab results See primary care if problem persists   ED Prescriptions     Medication Sig Dispense Auth. Provider   predniSONE (DELTASONE) 50 MG tablet Take once a day for 5 days.  Take with food 5 tablet Eustace Moore, MD      PDMP not reviewed this encounter.   Eustace Moore, MD 03/27/23 [phone]

## 2023-03-27 NOTE — ED Triage Notes (Signed)
Patient c/o sore throat x 1 month.  Patient denies any OTC pain meds.  Denies cough and congestion.

## 2023-03-28 LAB — CERVICOVAGINAL ANCILLARY ONLY
Bacterial Vaginitis (gardnerella): NEGATIVE
Candida Glabrata: NEGATIVE
Candida Vaginitis: NEGATIVE
Chlamydia: NEGATIVE
Comment: NEGATIVE
Comment: NEGATIVE
Comment: NEGATIVE
Comment: NEGATIVE
Comment: NEGATIVE
Comment: NORMAL
Neisseria Gonorrhea: NEGATIVE
Trichomonas: NEGATIVE

## 2023-04-05 ENCOUNTER — Telehealth: Payer: Self-pay | Admitting: *Deleted

## 2023-04-05 ENCOUNTER — Ambulatory Visit
Admission: RE | Admit: 2023-04-05 | Discharge: 2023-04-05 | Disposition: A | Discharge status: Home / Self Care | Payer: Medicaid Other | Source: Ambulatory Visit | Attending: Physician Assistant | Admitting: Physician Assistant

## 2023-04-05 VITALS — BP 114/82 | HR 89 | Temp 99.0°F | Resp 18 | Ht 66.0 in | Wt 148.0 lb

## 2023-04-05 DIAGNOSIS — N3000 Acute cystitis without hematuria: Secondary | ICD-10-CM

## 2023-04-05 DIAGNOSIS — Z113 Encounter for screening for infections with a predominantly sexual mode of transmission: Secondary | ICD-10-CM

## 2023-04-05 DIAGNOSIS — R3915 Urgency of urination: Secondary | ICD-10-CM

## 2023-04-05 LAB — POCT URINALYSIS DIP (MANUAL ENTRY)
Bilirubin, UA: NEGATIVE
Glucose, UA: NEGATIVE mg/dL
Ketones, POC UA: NEGATIVE mg/dL
Nitrite, UA: POSITIVE — AB
Protein Ur, POC: NEGATIVE mg/dL
Spec Grav, UA: 1.02 (ref 1.010–1.025)
Urobilinogen, UA: 1 E.U./dL
pH, UA: 6.5 (ref 5.0–8.0)

## 2023-04-05 MED ORDER — NITROFURANTOIN MONOHYD MACRO 100 MG PO CAPS: 100.0000 mg | ORAL_CAPSULE | Freq: Two times a day (BID) | ORAL | 0 refills | Status: DC

## 2023-04-05 MED ORDER — NITROFURANTOIN MONOHYD MACRO 100 MG PO CAPS: 100.0000 mg | ORAL_CAPSULE | Freq: Two times a day (BID) | ORAL | 0 refills | Status: AC

## 2023-04-05 NOTE — Discharge Instructions (Signed)
Follow up with any further concerns or worsening/ persistent symptoms.

## 2023-04-05 NOTE — ED Provider Notes (Signed)
[facility]    CSN: [id number] Arrival date & time: 04/05/23  1836      History   Chief Complaint Chief Complaint  Patient presents with   Urinary Frequency    Entered by patient    HPI Lindsey Castillo is a 32 y.o. female.   Patient here today for evaluation of malodorous urine and urethral itching that started over the last few days. She has not had fever. She denies any abdominal pain or back pain. She has not had any vomiting. She denies vaginal discharge or genital lesions. She is requesting STD blood work for screening only- she denies concerning symptoms or known exposure. She recently had cytology swab for screening.   The history is provided by the patient.  Urinary Frequency Pertinent negatives include no abdominal pain and no shortness of breath.    Past Medical History:  Diagnosis Date   Abnormal Pap smear    last pap 07/2012   Allergy    Anemia    Anxiety    Asthma    USES INHALER   Depression    Eczema    Fracture, ankle AGE 39   Headache(784.0)    Infection    trich   Infection    BV   Infection    HX OF FREQUENT   NSVD (normal spontaneous vaginal delivery) 01/01/2013   SVD at 1525   Seasonal allergies     Patient Active Problem List   Diagnosis Date Noted   Major depressive disorder, recurrent episode (HCC) 02/24/2021   Suicidal overdose (HCC) 02/23/2021   Rhabdomyolysis 04/03/2017   Anemia 04/03/2017   Tobacco abuse 04/03/2017   Vaginal discharge 04/03/2017   Gonorrhea in female 09/14/2016   Colitis, acute 09/14/2016   SIRS (systemic inflammatory response syndrome) (HCC) 09/12/2016   Right lower quadrant abdominal pain 09/12/2016   NSVD (normal spontaneous vaginal delivery) 01/01/2013   Asthma 08/02/2012   Eczema 08/02/2012    Past Surgical History:  Procedure Laterality Date   FOOT SURGERY Right    "15 splinters in my foot"    OB History     Gravida  1   Para  1   Term  1   Preterm      AB      Living  1       SAB      IAB      Ectopic      Multiple      Live Births  1            Home Medications    Prior to Admission medications   Medication Sig Start Date End Date Taking? Authorizing Provider  albuterol (VENTOLIN HFA) 108 (90 Base) MCG/ACT inhaler Inhale 1-2 puffs into the lungs every 6 (six) hours as needed for wheezing or shortness of breath. 12/29/22   Wallis Bamberg, PA-C  cetirizine (ZYRTEC) 10 MG tablet Take 1 tablet (10 mg total) by mouth daily. 12/29/22   Wallis Bamberg, PA-C  escitalopram (LEXAPRO) 20 MG tablet Take 1 tablet by mouth daily. 01/11/21   [provider]  fluticasone (FLONASE) 50 MCG/ACT nasal spray Place 2 sprays into both nostrils daily. 12/29/22   Wallis Bamberg, PA-C  nitrofurantoin, macrocrystal-monohydrate, (MACROBID) 100 MG capsule Take 1 capsule (100 mg total) by mouth 2 (two) times daily for 7 days. 04/05/23 04/12/23  Merrilee Jansky, MD  predniSONE (DELTASONE) 50 MG tablet Take once a day for 5 days.  Take with food 03/27/23  Eustace Moore, MD    Family History Family History  Problem Relation Age of Onset   Hypertension Father    Heart disease Father    Thyroid disease Father    Alcoholism Father    Asthma Father    Cancer Maternal Grandmother    Diabetes Maternal Grandmother    Kidney disease Maternal Grandmother    Arthritis Maternal Grandmother    Hypertension Maternal Grandmother    Depression Mother    Mental illness Mother    Birth defects Sister        HEART MURMUR   Asthma Brother    Miscarriages / [place] Cousin    Thyroid disease Sister     Social History Social History   Tobacco Use   Smoking status: Former    Current packs/day: 0.00    Average packs/day: 1 pack/day for 11.0 years (11.0 ttl pk-yrs)    Types: Cigarettes    Start date: 05/24/2001    Quit date: 05/24/2012    Years since quitting: 10.8   Smokeless tobacco: Never  Vaping Use   Vaping status: Never Used  Substance Use Topics   Alcohol use:  Yes    Alcohol/week: 56.0 standard drinks of alcohol    Types: 56 Cans of beer per week    Comment: drinks (4) 24-oz beers a day   Drug use: Not Currently     Allergies   Coconut fatty acids   Review of Systems Review of Systems  Constitutional:  Negative for chills and fever.  Eyes:  Negative for discharge and redness.  Respiratory:  Negative for shortness of breath.   Gastrointestinal:  Negative for abdominal pain, nausea and vomiting.  Genitourinary:  Positive for frequency. Negative for genital sores, vaginal bleeding and vaginal discharge.  Musculoskeletal:  Negative for back pain.     Physical Exam Triage Vital Signs ED Triage Vitals [04/05/23 1841]  Encounter Vitals Group     BP      Systolic BP Percentile      Diastolic BP Percentile      Pulse      Resp      Temp      Temp src      SpO2      Weight 148 lb (67.1 kg)     Height 5\' 6"  (1.676 m)     Head Circumference      Peak Flow      Pain Score 0     Pain Loc      Pain Education      Exclude from Growth Chart    No data found.  Updated Vital Signs BP 114/82 (BP Location: Left Arm)   Pulse 89   Temp 99 F (37.2 C) (Oral)   Resp 18   Ht 5\' 6"  (1.676 m)   Wt 148 lb (67.1 kg)   LMP 03/22/2023 (Exact Date)   SpO2 98%   BMI 23.89 kg/m      Physical Exam Vitals and nursing note reviewed.  Constitutional:      General: She is not in acute distress.    Appearance: Normal appearance. She is not ill-appearing.  HENT:     Head: Normocephalic and atraumatic.  Eyes:     Conjunctiva/sclera: Conjunctivae normal.  Cardiovascular:     Rate and Rhythm: Normal rate.  Pulmonary:     Effort: Pulmonary effort is normal. No respiratory distress.  Neurological:     Mental Status: She is alert.  Psychiatric:  Mood and Affect: Mood normal.        Behavior: Behavior normal.        Thought Content: Thought content normal.      UC Treatments / Results  Labs (all labs ordered are listed, but only  abnormal results are displayed) Labs Reviewed  POCT URINALYSIS DIP (MANUAL ENTRY) - Abnormal; Notable for the following components:      Result Value   Clarity, UA cloudy (*)    Blood, UA small (*)    Nitrite, UA Positive (*)    Leukocytes, UA Large (3+) (*)    All other components within normal limits  URINE CULTURE  RPR  HIV ANTIBODY (ROUTINE TESTING W REFLEX)  HEPATITIS PANEL, ACUTE    EKG   Radiology No results found.  Procedures Procedures (including critical care time)  Medications Ordered in UC Medications - No data to display  Initial Impression / Assessment and Plan / UC Course  I have reviewed the triage vital signs and the nursing notes.  Pertinent labs & imaging results that were available during my care of the patient were reviewed by me and considered in my medical decision making (see chart for details).    Will treat to cover UTI with macrobid and encouraged follow up if no gradual improvement or with any further concerns. Urine culture ordered as well as STD screening as requested.   Final Clinical Impressions(s) / UC Diagnoses   Final diagnoses:  Screening for STD (sexually transmitted disease)  Urinary urgency  Acute cystitis without hematuria     Discharge Instructions      Follow up with any further concerns or worsening/ persistent symptoms.      ED Prescriptions     Medication Sig Dispense Auth. Provider   nitrofurantoin, macrocrystal-monohydrate, (MACROBID) 100 MG capsule Take 1 capsule (100 mg total) by mouth 2 (two) times daily for 7 days. 14 capsule Tomi Bamberger, PA-C      PDMP not reviewed this encounter.   Tomi Bamberger, PA-C 04/05/23 [phone]

## 2023-04-05 NOTE — ED Triage Notes (Signed)
"  My urine has a bad odor and urethral itching". No redness/rash seen. "I would like to have the blood work done too for STI, Cytology swab done recently and was negative but I would like blood work if possible". No fever.

## 2023-04-06 LAB — RPR: RPR Ser Ql: NONREACTIVE

## 2023-05-16 ENCOUNTER — Ambulatory Visit: Payer: Medicaid Other

## 2023-06-04 ENCOUNTER — Telehealth: Payer: Medicaid Other | Admitting: Nurse Practitioner

## 2023-06-04 DIAGNOSIS — R110 Nausea: Secondary | ICD-10-CM

## 2023-06-04 DIAGNOSIS — R42 Dizziness and giddiness: Secondary | ICD-10-CM

## 2023-06-04 MED ORDER — MECLIZINE HCL 12.5 MG PO TABS
12.5000 mg | ORAL_TABLET | Freq: Three times a day (TID) | ORAL | 0 refills | Status: DC | PRN
Start: 2023-06-04 — End: 2024-01-29

## 2023-06-04 MED ORDER — ONDANSETRON HCL 4 MG PO TABS: 4.0000 mg | ORAL_TABLET | Freq: Three times a day (TID) | ORAL | 0 refills | Status: AC | PRN

## 2023-06-04 NOTE — Patient Instructions (Signed)
Lindsey Castillo, thank you for joining Claiborne Rigg, NP for today's virtual visit.  While this provider is not your primary care provider (PCP), if your PCP is located in our provider database this encounter information will be shared with them immediately following your visit.   A [facility] MyChart account gives you access to today's visit and all your visits, tests, and labs performed at [facility] " click here if you don't have a [facility] MyChart account or go to [email or web address]  Consent: (Patient) Lindsey Castillo provided verbal consent for this virtual visit at the beginning of the encounter.  Current Medications:  Current Outpatient Medications:    meclizine (ANTIVERT) 12.5 MG tablet, Take 1-2 tablets (12.5-25 mg total) by mouth 3 (three) times daily as needed for dizziness., Disp: 30 tablet, Rfl: 0   ondansetron (ZOFRAN) 4 MG tablet, Take 1 tablet (4 mg total) by mouth every 8 (eight) hours as needed for nausea or vomiting., Disp: 20 tablet, Rfl: 0   albuterol (VENTOLIN HFA) 108 (90 Base) MCG/ACT inhaler, Inhale 1-2 puffs into the lungs every 6 (six) hours as needed for wheezing or shortness of breath., Disp: 18 g, Rfl: 0   cetirizine (ZYRTEC) 10 MG tablet, Take 1 tablet (10 mg total) by mouth daily., Disp: 90 tablet, Rfl: 0   escitalopram (LEXAPRO) 20 MG tablet, Take 1 tablet by mouth daily., Disp: , Rfl:    fluticasone (FLONASE) 50 MCG/ACT nasal spray, Place 2 sprays into both nostrils daily., Disp: 16 mL, Rfl: 0   predniSONE (DELTASONE) 50 MG tablet, Take once a day for 5 days.  Take with food, Disp: 5 tablet, Rfl: 0   Medications ordered in this encounter:  Meds ordered this encounter  Medications   meclizine (ANTIVERT) 12.5 MG tablet    Sig: Take 1-2 tablets (12.5-25 mg total) by mouth 3 (three) times daily as needed for dizziness.    Dispense:  30 tablet    Refill:  0    Order Specific Question:   Supervising Provider    Answer:   LAMPTEY, PHILIP O  [[id number]]   ondansetron (ZOFRAN) 4 MG tablet    Sig: Take 1 tablet (4 mg total) by mouth every 8 (eight) hours as needed for nausea or vomiting.    Dispense:  20 tablet    Refill:  0    Order Specific Question:   Supervising Provider    Answer:   Merrilee Jansky [id number]     *If you need refills on other medications prior to your next appointment, please contact your pharmacy*  Follow-Up: Call back or seek an in-person evaluation if the symptoms worsen or if the condition fails to improve as anticipated.  [facility] [phone]  Other Instructions Follow up with PCP   If you have been instructed to have an in-person evaluation today at a local Urgent Care facility, please use the link below. It will take you to a list of all of our available [facility], including address, phone number and hours of operation. Please do not delay care.  [facility]  If you or a family member do not have a primary care provider, use the link below to schedule a visit and establish care. When you choose a [facility] primary care physician or advanced practice provider, you gain a long-term partner in health. Find a Primary Care Provider  Learn more about [facility]'s in-office and virtual care options:  -  Get Care Now

## 2023-06-04 NOTE — Progress Notes (Signed)
Virtual Visit Consent   Lindsey Castillo, you are scheduled for a virtual visit with a [facility] provider today. Just as with appointments in the office, your consent must be obtained to participate. Your consent will be active for this visit and any virtual visit you may have with one of our providers in the next 365 days. If you have a MyChart account, a copy of this consent can be sent to you electronically.  As this is a virtual visit, video technology does not allow for your provider to perform a traditional examination. This may limit your provider's ability to fully assess your condition. If your provider identifies any concerns that need to be evaluated in person or the need to arrange testing (such as labs, EKG, etc.), we will make arrangements to do so. Although advances in technology are sophisticated, we cannot ensure that it will always work on either your end or our end. If the connection with a video visit is poor, the visit may have to be switched to a telephone visit. With either a video or telephone visit, we are not always able to ensure that we have a secure connection.  By engaging in this virtual visit, you consent to the provision of healthcare and authorize for your insurance to be billed (if applicable) for the services provided during this visit. Depending on your insurance coverage, you may receive a charge related to this service.  I need to obtain your verbal consent now. Are you willing to proceed with your visit today? Lindsey Castillo has provided verbal consent on 06/04/2023 for a virtual visit (video or telephone). Claiborne Rigg, NP  Date: 06/04/2023 10:33 AM  Virtual Visit via Video Note   I, Claiborne Rigg, connected with  Lindsey Castillo  ([id number], 03-08-1991) on 06/04/23 at 10:30 AM EDT by a video-enabled telemedicine application and verified that I am speaking with the correct person using two identifiers.  Location: Patient: Virtual Visit Location Patient:  Home Provider: Virtual Visit Location Provider: Home Office   I discussed the limitations of evaluation and management by telemedicine and the availability of in person appointments. The patient expressed understanding and agreed to proceed.    History of Present Illness: Lindsey Castillo is a 32 y.o. who identifies as a female who was assigned female at birth, and is being seen today for vertigo, headaches and nausea.  Lindsey Castillo endorses chronic Headaches, nausea and dizziness. She was seen by her PCP 2 days ago in office and  is currently being worked up for the same symptoms. Labs drawn and referrals were made by PCP. Lindsey Castillo states she just wants to know what's wrong with her and does she need to go to the emergency room.  She has been having headaches (behind ears and top of heads), they are random but "very strong". She is still having dizziness - has to lay down to reset. Taking excedrin for headaches, and OTC medication for dizziness. She does not have a history of HTN. Denies chest pain or acute onset of visual disturbances or hemiparesis.    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   Anemia 04/03/2017   Tobacco abuse 04/03/2017   Vaginal discharge 04/03/2017   Gonorrhea in female 09/14/2016   Colitis, acute 09/14/2016   SIRS (systemic inflammatory response syndrome) (HCC) 09/12/2016   Right  lower quadrant abdominal pain 09/12/2016   NSVD (normal spontaneous vaginal delivery) 01/01/2013   Asthma 08/02/2012   Eczema 08/02/2012    Allergies:  Allergies  Allergen Reactions   Coconut Fatty Acid Itching and Swelling   Medications:  Current Outpatient Medications:    meclizine (ANTIVERT) 12.5 MG tablet, Take 1-2 tablets (12.5-25 mg total) by mouth 3 (three) times daily as needed for dizziness., Disp: 30 tablet, Rfl: 0   ondansetron  (ZOFRAN) 4 MG tablet, Take 1 tablet (4 mg total) by mouth every 8 (eight) hours as needed for nausea or vomiting., Disp: 20 tablet, Rfl: 0   albuterol (VENTOLIN HFA) 108 (90 Base) MCG/ACT inhaler, Inhale 1-2 puffs into the lungs every 6 (six) hours as needed for wheezing or shortness of breath., Disp: 18 g, Rfl: 0   cetirizine (ZYRTEC) 10 MG tablet, Take 1 tablet (10 mg total) by mouth daily., Disp: 90 tablet, Rfl: 0   escitalopram (LEXAPRO) 20 MG tablet, Take 1 tablet by mouth daily., Disp: , Rfl:    fluticasone (FLONASE) 50 MCG/ACT nasal spray, Place 2 sprays into both nostrils daily., Disp: 16 mL, Rfl: 0   predniSONE (DELTASONE) 50 MG tablet, Take once a day for 5 days.  Take with food, Disp: 5 tablet, Rfl: 0  Observations/Objective: Patient is well-developed, well-nourished in no acute distress.  Resting comfortably at home.  Head is normocephalic, atraumatic.  No labored breathing.  Speech is clear and coherent with logical content.  Patient is alert and oriented at baseline.    Assessment and Plan: 1. Vertigo - meclizine (ANTIVERT) 12.5 MG tablet; Take 1-2 tablets (12.5-25 mg total) by mouth 3 (three) times daily as needed for dizziness.  Dispense: 30 tablet; Refill: 0  2. Nausea - ondansetron (ZOFRAN) 4 MG tablet; Take 1 tablet (4 mg total) by mouth every 8 (eight) hours as needed for nausea or vomiting.  Dispense: 20 tablet; Refill: 0  Follow up with PCP  Follow Up Instructions: I discussed the assessment and treatment plan with the patient. The patient was provided an opportunity to ask questions and all were answered. The patient agreed with the plan and demonstrated an understanding of the instructions.  A copy of instructions were sent to the patient via MyChart unless otherwise noted below.    The patient was advised to call back or seek an in-person evaluation if the symptoms worsen or if the condition fails to improve as anticipated.  Time:  I spent 12 minutes with the  patient via telehealth technology discussing the above problems/concerns.    Claiborne Rigg, NP

## 2023-06-05 ENCOUNTER — Ambulatory Visit: Payer: Medicaid Other

## 2023-06-07 ENCOUNTER — Ambulatory Visit: Payer: Medicaid Other

## 2023-06-19 ENCOUNTER — Other Ambulatory Visit: Payer: Self-pay

## 2023-06-19 ENCOUNTER — Emergency Department (HOSPITAL_COMMUNITY)
Admission: EM | Admit: 2023-06-19 | Discharge: 2023-06-19 | Disposition: A | Discharge status: Home / Self Care | Payer: Medicaid Other | Attending: Emergency Medicine | Admitting: Emergency Medicine

## 2023-06-19 ENCOUNTER — Emergency Department (HOSPITAL_COMMUNITY): Payer: Medicaid Other

## 2023-06-19 ENCOUNTER — Encounter (HOSPITAL_COMMUNITY): Payer: Self-pay

## 2023-06-19 DIAGNOSIS — R42 Dizziness and giddiness: Secondary | ICD-10-CM | POA: Diagnosis present

## 2023-06-19 DIAGNOSIS — R079 Chest pain, unspecified: Secondary | ICD-10-CM | POA: Diagnosis not present

## 2023-06-19 LAB — CBC WITH DIFFERENTIAL/PLATELET
Abs Immature Granulocytes: 0.02 10*3/uL (ref 0.00–0.07)
Basophils Absolute: 0.1 10*3/uL (ref 0.0–0.1)
Basophils Relative: 1 %
Eosinophils Absolute: 0.1 10*3/uL (ref 0.0–0.5)
Eosinophils Relative: 1 %
HCT: 41.4 % (ref 36.0–46.0)
Hemoglobin: 13.5 g/dL (ref 12.0–15.0)
Immature Granulocytes: 0 %
Lymphocytes Relative: 32 %
Lymphs Abs: 3 10*3/uL (ref 0.7–4.0)
MCH: 28.2 pg (ref 26.0–34.0)
MCHC: 32.6 g/dL (ref 30.0–36.0)
MCV: 86.6 fL (ref 80.0–100.0)
Monocytes Absolute: 0.5 10*3/uL (ref 0.1–1.0)
Monocytes Relative: 5 %
Neutro Abs: 5.8 10*3/uL (ref 1.7–7.7)
Neutrophils Relative %: 61 %
Platelets: 350 10*3/uL (ref 150–400)
RBC: 4.78 MIL/uL (ref 3.87–5.11)
RDW: 17.1 % — ABNORMAL HIGH (ref 11.5–15.5)
WBC: 9.3 10*3/uL (ref 4.0–10.5)
nRBC: 0 % (ref 0.0–0.2)

## 2023-06-19 LAB — URINALYSIS, ROUTINE W REFLEX MICROSCOPIC
Bacteria, UA: NONE SEEN
Bilirubin Urine: NEGATIVE
Glucose, UA: NEGATIVE mg/dL
Ketones, ur: NEGATIVE mg/dL
Leukocytes,Ua: NEGATIVE
Nitrite: NEGATIVE
Protein, ur: NEGATIVE mg/dL
Specific Gravity, Urine: 1.006 (ref 1.005–1.030)
pH: 6 (ref 5.0–8.0)

## 2023-06-19 LAB — COMPREHENSIVE METABOLIC PANEL
ALT: 12 U/L (ref 0–44)
AST: 18 U/L (ref 15–41)
Albumin: 4.8 g/dL (ref 3.5–5.0)
Alkaline Phosphatase: 59 U/L (ref 38–126)
Anion gap: 12 (ref 5–15)
BUN: <5 mg/dL — ABNORMAL LOW (ref 6–20)
CO2: 24 mmol/L (ref 22–32)
Calcium: 9.2 mg/dL (ref 8.9–10.3)
Chloride: 105 mmol/L (ref 98–111)
Creatinine, Ser: 0.7 mg/dL (ref 0.44–1.00)
GFR, Estimated: >60 mL/min (ref >60)
Glucose, Bld: 86 mg/dL (ref 70–99)
Potassium: 3.9 mmol/L (ref 3.5–5.1)
Sodium: 141 mmol/L (ref 135–145)
Total Bilirubin: 0.6 mg/dL (ref 0.3–1.2)
Total Protein: 8.5 g/dL — ABNORMAL HIGH (ref 6.5–8.1)

## 2023-06-19 LAB — TROPONIN I (HIGH SENSITIVITY): Troponin I (High Sensitivity): <2 ng/L (ref <18)

## 2023-06-19 LAB — PREGNANCY, URINE: Preg Test, Ur: NEGATIVE

## 2023-06-19 MED ORDER — MECLIZINE HCL 25 MG PO TABS: 25.0000 mg | ORAL_TABLET | Freq: Three times a day (TID) | ORAL | 0 refills | Status: AC | PRN

## 2023-06-19 MED ORDER — MECLIZINE HCL 25 MG PO TABS
50.0000 mg | ORAL_TABLET | Freq: Once | ORAL | Status: AC
  Administered 2023-06-19: 50 mg via ORAL
  Filled 2023-06-19: qty 2

## 2023-06-19 NOTE — ED Provider Notes (Signed)
[place] EMERGENCY DEPARTMENT AT [facility] Provider Note   CSN: [id number] Arrival date & time: 06/19/23  1535     History  Chief Complaint  Patient presents with   Chest Pain   Shortness of Breath   Dizziness    Lindsey Castillo is a 32 y.o. female presenting to ED with several complaints.  The patient ports she has had intermittent vertigo for several months.  She says this is extremely positional, worse in the morning and with sudden head movement.  She says she sometimes has fullness in her right ear but no ringing or loss of hearing.  She says she was told she needs to see ENT and is trying to schedule an appointment.  She is concerned because the episodes sometimes occur when she is driving.  She has been having the same vertiginous symptoms since this morning, worse with head movement.  Separately she reports concerns that she has had sharp chest pain intermittent for 3 days.  This pain lasted couple seconds to a few minutes and then resolved.  It occurred earlier this morning.  She has never had the symptoms before.  She reports that radiates from the middle of her chest to her left shoulder.  It is not pleuritic.  Denies history of any significant medical issues, other than reported anemia which was improved with iron.  HPI     Home Medications Prior to Admission medications   Medication Sig Start Date End Date Taking? Authorizing Provider  meclizine (ANTIVERT) 25 MG tablet Take 1 tablet (25 mg total) by mouth 3 (three) times daily as needed for up to 30 doses for dizziness. 06/19/23  Yes Emberly Tomasso, Kermit Balo, MD  albuterol (VENTOLIN HFA) 108 (90 Base) MCG/ACT inhaler Inhale 1-2 puffs into the lungs every 6 (six) hours as needed for wheezing or shortness of breath. 12/29/22   Wallis Bamberg, PA-C  cetirizine (ZYRTEC) 10 MG tablet Take 1 tablet (10 mg total) by mouth daily. 12/29/22   Wallis Bamberg, PA-C  escitalopram (LEXAPRO) 20 MG tablet Take 1 tablet by mouth daily.  01/11/21   [provider]  fluticasone (FLONASE) 50 MCG/ACT nasal spray Place 2 sprays into both nostrils daily. 12/29/22   Wallis Bamberg, PA-C  meclizine (ANTIVERT) 12.5 MG tablet Take 1-2 tablets (12.5-25 mg total) by mouth 3 (three) times daily as needed for dizziness. 06/04/23   Claiborne Rigg, NP  ondansetron (ZOFRAN) 4 MG tablet Take 1 tablet (4 mg total) by mouth every 8 (eight) hours as needed for nausea or vomiting. 06/04/23   Claiborne Rigg, NP  predniSONE (DELTASONE) 50 MG tablet Take once a day for 5 days.  Take with food 03/27/23   Eustace Moore, MD      Allergies    Coconut fatty acid    Review of Systems   Review of Systems  Physical Exam Updated Vital Signs BP 109/77 (BP Location: Left Arm)   Pulse 74   Temp 98 F (36.7 C) (Oral)   Resp 15   Ht 5\' 6"  (1.676 m)   Wt 67.1 kg   LMP 06/12/2023   SpO2 100%   BMI 23.88 kg/m  Physical Exam Constitutional:      General: She is not in acute distress. HENT:     Head: Normocephalic and atraumatic.  Eyes:     Conjunctiva/sclera: Conjunctivae normal.     Pupils: Pupils are equal, round, and reactive to light.  Cardiovascular:     Rate and  Rhythm: Normal rate and regular rhythm.  Pulmonary:     Effort: Pulmonary effort is normal. No respiratory distress.  Abdominal:     General: There is no distension.     Tenderness: There is no abdominal tenderness.  Skin:    General: Skin is warm and dry.  Neurological:     General: No focal deficit present.     Mental Status: She is alert and oriented to person, place, and time. Mental status is at baseline.     Comments: Patient demonstrating active vertigo symptoms and nystagmus Head impulse shows corrective saccades Unilateral horizontal nystagmus present. Test of skew shows no skew   Psychiatric:        Mood and Affect: Mood normal.        Behavior: Behavior normal.     ED Results / Procedures / Treatments   Labs (all labs ordered are listed, but only  abnormal results are displayed) Labs Reviewed  COMPREHENSIVE METABOLIC PANEL - Abnormal; Notable for the following components:      Result Value   BUN <5 (*)    Total Protein 8.5 (*)    All other components within normal limits  CBC WITH DIFFERENTIAL/PLATELET - Abnormal; Notable for the following components:   RDW 17.1 (*)    All other components within normal limits  URINALYSIS, ROUTINE W REFLEX MICROSCOPIC - Abnormal; Notable for the following components:   Hgb urine dipstick SMALL (*)    All other components within normal limits  PREGNANCY, URINE  TROPONIN I (HIGH SENSITIVITY)    EKG EKG Interpretation Date/Time:  Monday June 19 2023 21:24:40 EDT Ventricular Rate:  62 PR Interval:  119 QRS Duration:  86 QT Interval:  436 QTC Calculation: 443 R Axis:   91  Text Interpretation: Sinus rhythm Atrial premature complex Borderline short PR interval Confirmed by Alvester Chou [phone]) on 06/19/2023 9:32:25 PM  Radiology CT Head Wo Contrast  Result Date: 06/19/2023 CLINICAL DATA:  Headache, increasing frequency or severity EXAM: CT HEAD WITHOUT CONTRAST TECHNIQUE: Contiguous axial images were obtained from the base of the skull through the vertex without intravenous contrast. RADIATION DOSE REDUCTION: This exam was performed according to the departmental dose-optimization program which includes automated exposure control, adjustment of the mA and/or kV according to patient size and/or use of iterative reconstruction technique. COMPARISON:  None Available. FINDINGS: Brain: No intracranial hemorrhage, mass effect, or midline shift. No hydrocephalus. The basilar cisterns are patent. No evidence of territorial infarct or acute ischemia. No extra-axial or intracranial fluid collection. Vascular: No hyperdense vessel or unexpected calcification. Skull: No fracture or focal lesion. Sinuses/Orbits: Paranasal sinuses and mastoid air cells are clear. The visualized orbits are unremarkable. Other:  None. IMPRESSION: Negative noncontrast head CT. Electronically Signed   By: Narda Rutherford M.D.   On: 06/19/2023 17:20    Procedures Procedures    Medications Ordered in ED Medications  meclizine (ANTIVERT) tablet 50 mg (has no administration in time range)    ED Course/ Medical Decision Making/ A&P                                 Medical Decision Making  Patient presents with several complaints   1. vertigo which I suspect is likely peripheral vertigo given her hints exam and description of her symptoms.  I have a much lower suspicion for central vertigo including demyelinating lesion or stroke.  Patient did have CT scan performed from  triage which I reviewed personally showing no acute abnormalities.  Her ear exam is unremarkable, low suspicion for Mnire's disease or ear infection.  I will give her oral meclizine here and prescribed this for home and I encouraged her to follow-up again with the ENT for suspected BPPV.  2.  Chest pain and nausea, which is most likely related to reflux gastritis or nonspecific chest pain.  I have a very low suspicion for acute PE, as the pain is intermittent and she has not tachycardic or hypoxic, no other risk factors for PE.  I reviewed the patient's labs including a troponin which is undetectable, chest x-ray which is unremarkable per my review.  Doubt pneumothorax or ACS or pericarditis.  Doubt pneumonia.  She can follow-up with her PCP for this issue, patient is stable for discharge from this perspective.  Her EKG per my interpretation shows no acute ischemic findings        Final Clinical Impression(s) / ED Diagnoses Final diagnoses:  Vertigo  Chest pain, unspecified type    Rx / DC Orders ED Discharge Orders          Ordered    meclizine (ANTIVERT) 25 MG tablet  3 times daily PRN        06/19/23 2123              Terald Sleeper, MD 06/19/23 2132

## 2023-06-19 NOTE — ED Triage Notes (Signed)
Reports for the past 3 days has been experiencing cp, shob,dizziness. States has been seeing her pcp and being worked up for dizziness and anemia. Denies any other symptoms

## 2023-06-19 NOTE — ED Provider Triage Note (Signed)
Emergency Medicine Provider Triage Evaluation Note  Lindsey Castillo , a 32 y.o. female  was evaluated in triage.  Pt complains of intermittent headaches on the right side for several months along with associated nausea dizziness and balance issues.  Today patient was also noticing chest pain and shortness of breath which brought her here to the emergency department.  She has a history of iron deficiency anemia.  She has been following with her PCP.  Review of Systems  Positive: Headache dizziness chest pain Negative: Fever, leg swelling or OCP use  Physical Exam  BP (!) 140/105 (BP Location: Left Arm)   Pulse 80   Temp 98.8 F (37.1 C) (Oral)   Resp 16   SpO2 100%  Gen:   Awake, no distress   Resp:  Normal effort  MSK:   Moves extremities without difficulty  Other:  No facial droop  Medical Decision Making  Medically screening exam initiated at 3:52 PM.  Appropriate orders placed.  Lindsey Castillo was informed that the remainder of the evaluation will be completed by another provider, this initial triage assessment does not replace that evaluation, and the importance of remaining in the ED until their evaluation is complete.     Arthor Captain, PA-C 06/19/23 2051

## 2023-06-19 NOTE — Discharge Instructions (Addendum)
You should follow-up with the ear nose and throat specialist for your vertigo which is likely related to a condition involving the ear.  Please read over the attached information to familiarize yourself with the causes of vertigo and this condition.  I prescribed a medicine to take as needed for acute vertigo, which is called meclizine.

## 2023-07-11 ENCOUNTER — Ambulatory Visit: Payer: Medicaid Other | Admitting: Family Medicine

## 2023-11-04 ENCOUNTER — Telehealth: Payer: Medicaid Other | Admitting: Physician Assistant

## 2023-11-04 DIAGNOSIS — R6889 Other general symptoms and signs: Secondary | ICD-10-CM

## 2023-11-04 MED ORDER — FLUTICASONE PROPIONATE 50 MCG/ACT NA SUSP: 2.0000 | Freq: Every day | NASAL | 0 refills | Status: DC

## 2023-11-04 MED ORDER — CETIRIZINE HCL 10 MG PO TABS: 10.0000 mg | ORAL_TABLET | Freq: Every day | ORAL | 0 refills | Status: AC

## 2023-11-04 MED ORDER — BENZONATATE 100 MG PO CAPS: 100.0000 mg | ORAL_CAPSULE | Freq: Two times a day (BID) | ORAL | 0 refills | Status: DC | PRN

## 2023-11-04 NOTE — Patient Instructions (Signed)
  Mertha Finders, thank you for joining Laure Kidney, PA-C for today's virtual visit.  While this provider is not your primary care provider (PCP), if your PCP is located in our provider database this encounter information will be shared with them immediately following your visit.   A [facility] MyChart account gives you access to today's visit and all your visits, tests, and labs performed at [facility] " click here if you don't have a [facility] MyChart account or go to [email or web address]  Consent: (Patient) Mertha Finders provided verbal consent for this virtual visit at the beginning of the encounter.  Current Medications:  Current Outpatient Medications:    albuterol (VENTOLIN HFA) 108 (90 Base) MCG/ACT inhaler, Inhale 1-2 puffs into the lungs every 6 (six) hours as needed for wheezing or shortness of breath., Disp: 18 g, Rfl: 0   cetirizine (ZYRTEC) 10 MG tablet, Take 1 tablet (10 mg total) by mouth daily., Disp: 90 tablet, Rfl: 0   escitalopram (LEXAPRO) 20 MG tablet, Take 1 tablet by mouth daily., Disp: , Rfl:    fluticasone (FLONASE) 50 MCG/ACT nasal spray, Place 2 sprays into both nostrils daily., Disp: 16 mL, Rfl: 0   meclizine (ANTIVERT) 12.5 MG tablet, Take 1-2 tablets (12.5-25 mg total) by mouth 3 (three) times daily as needed for dizziness., Disp: 30 tablet, Rfl: 0   meclizine (ANTIVERT) 25 MG tablet, Take 1 tablet (25 mg total) by mouth 3 (three) times daily as needed for up to 30 doses for dizziness., Disp: 30 tablet, Rfl: 0   ondansetron (ZOFRAN) 4 MG tablet, Take 1 tablet (4 mg total) by mouth every 8 (eight) hours as needed for nausea or vomiting., Disp: 20 tablet, Rfl: 0   predniSONE (DELTASONE) 50 MG tablet, Take once a day for 5 days.  Take with food, Disp: 5 tablet, Rfl: 0   Medications ordered in this encounter:  No orders of the defined types were placed in this encounter.    *If you need refills on other medications prior to your next  appointment, please contact your pharmacy*  Follow-Up: Call back or seek an in-person evaluation if the symptoms worsen or if the condition fails to improve as anticipated.  [facility] [phone]  Other Instructions Please report to the nearest Emergency room with any worsening symptoms. Follow up with primary care provider (PCP) in 2 -3 days.    If you have been instructed to have an in-person evaluation today at a local Urgent Care facility, please use the link below. It will take you to a list of all of our available [facility], including address, phone number and hours of operation. Please do not delay care.  [facility]  If you or a family member do not have a primary care provider, use the link below to schedule a visit and establish care. When you choose a [facility] primary care physician or advanced practice provider, you gain a long-term partner in health. Find a Primary Care Provider  Learn more about [facility]'s in-office and virtual care options: [facility] - Get Care Now

## 2023-11-04 NOTE — Progress Notes (Signed)
 Virtual Visit Consent   Lindsey Castillo, you are scheduled for a virtual visit with a [facility] provider today. Just as with appointments in the office, your consent must be obtained to participate. Your consent will be active for this visit and any virtual visit you may have with one of our providers in the next 365 days. If you have a MyChart account, a copy of this consent can be sent to you electronically.  As this is a virtual visit, video technology does not allow for your provider to perform a traditional examination. This may limit your provider's ability to fully assess your condition. If your provider identifies any concerns that need to be evaluated in person or the need to arrange testing (such as labs, EKG, etc.), we will make arrangements to do so. Although advances in technology are sophisticated, we cannot ensure that it will always work on either your end or our end. If the connection with a video visit is poor, the visit may have to be switched to a telephone visit. With either a video or telephone visit, we are not always able to ensure that we have a secure connection.  By engaging in this virtual visit, you consent to the provision of healthcare and authorize for your insurance to be billed (if applicable) for the services provided during this visit. Depending on your insurance coverage, you may receive a charge related to this service.  I need to obtain your verbal consent now. Are you willing to proceed with your visit today? Lindsey Castillo has provided verbal consent on 11/04/2023 for a virtual visit (video or telephone). Laure Kidney, [place]  Date: 11/04/2023 7:43 PM   Virtual Visit via Video Note   I, Laure Kidney, connected with  Lindsey Castillo  ([id number], [birth year]) on 11/04/23 at  7:30 PM EST by a video-enabled telemedicine application and verified that I am speaking with the correct person using two identifiers.  Location: Patient: Virtual Visit Location Patient:  Home Provider: Virtual Visit Location Provider: Home Office   I discussed the limitations of evaluation and management by telemedicine and the availability of in person appointments. The patient expressed understanding and agreed to proceed.    History of Present Illness: Lindsey Castillo is a 33 y.o. who identifies as a female who was assigned female at birth, and is being seen today for flu like symptoms.  HPI: URI  This is a new problem. The current episode started today. The problem has been unchanged. The maximum temperature recorded prior to her arrival was 100.4 - 100.9 F. Associated symptoms include coughing, nausea, rhinorrhea and sneezing. She has tried acetaminophen for the symptoms. The treatment provided moderate relief.    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   Anemia 04/03/2017   Tobacco abuse 04/03/2017   Vaginal discharge 04/03/2017   Gonorrhea in female 09/14/2016   Colitis, acute 09/14/2016   SIRS (systemic inflammatory response syndrome) (HCC) 09/12/2016   Right lower quadrant abdominal pain 09/12/2016   NSVD (normal spontaneous vaginal delivery) 01/01/2013   Asthma 08/02/2012   Eczema 08/02/2012    Allergies:  Allergies  Allergen Reactions   Coconut Fatty Acid Itching and Swelling   Medications:  Current Outpatient Medications:    albuterol (VENTOLIN HFA) 108 (90 Base) MCG/ACT inhaler, Inhale 1-2 puffs into the lungs every 6 (six) hours as needed  for wheezing or shortness of breath., Disp: 18 g, Rfl: 0   cetirizine (ZYRTEC) 10 MG tablet, Take 1 tablet (10 mg total) by mouth daily., Disp: 90 tablet, Rfl: 0   escitalopram (LEXAPRO) 20 MG tablet, Take 1 tablet by mouth daily., Disp: , Rfl:    fluticasone (FLONASE) 50 MCG/ACT nasal spray, Place 2 sprays into both nostrils daily., Disp: 16 mL, Rfl: 0   meclizine  (ANTIVERT) 12.5 MG tablet, Take 1-2 tablets (12.5-25 mg total) by mouth 3 (three) times daily as needed for dizziness., Disp: 30 tablet, Rfl: 0   meclizine (ANTIVERT) 25 MG tablet, Take 1 tablet (25 mg total) by mouth 3 (three) times daily as needed for up to 30 doses for dizziness., Disp: 30 tablet, Rfl: 0   ondansetron (ZOFRAN) 4 MG tablet, Take 1 tablet (4 mg total) by mouth every 8 (eight) hours as needed for nausea or vomiting., Disp: 20 tablet, Rfl: 0   predniSONE (DELTASONE) 50 MG tablet, Take once a day for 5 days.  Take with food, Disp: 5 tablet, Rfl: 0  Observations/Objective: Patient is well-developed, well-nourished in no acute distress.  Resting comfortably  at home.  Head is normocephalic, atraumatic.  No labored breathing.  Speech is clear and coherent with logical content.  Patient is alert and oriented at baseline.    Assessment and Plan: 1. Flu-like symptoms (Primary)   Patient presents with symptoms suspicious for likely viral uri . Differentials include bacterial pneumonia, sinusitis, allergic rhinitis. Do not suspect underlying cardiopulmonary process. I considered, but think unlikely, dangerous causes of this patient's symptoms to include ACS, CHF or COPD exacerbations, pneumonia, pneumothorax. Patient is nontoxic appearing and not in need of emergent medical intervention.  Plan: reassurance, reassessment, over the counter medications, discharge with PCP follow-up   Follow Up Instructions: I discussed the assessment and treatment plan with the patient. The patient was provided an opportunity to ask questions and all were answered. The patient agreed with the plan and demonstrated an understanding of the instructions.  A copy of instructions were sent to the patient via MyChart unless otherwise noted below.    The patient was advised to call back or seek an in-person evaluation if the symptoms worsen or if the condition fails to improve as anticipated.    Laure Kidney, PA-C

## 2023-11-06 ENCOUNTER — Emergency Department (HOSPITAL_COMMUNITY)
Admission: EM | Admit: 2023-11-06 | Discharge: 2023-11-07 | Discharge status: Against Medical Advice | Payer: Medicaid Other | Attending: Emergency Medicine | Admitting: Emergency Medicine

## 2023-11-06 DIAGNOSIS — R109 Unspecified abdominal pain: Secondary | ICD-10-CM | POA: Insufficient documentation

## 2023-11-06 DIAGNOSIS — R1110 Vomiting, unspecified: Secondary | ICD-10-CM | POA: Insufficient documentation

## 2023-11-06 DIAGNOSIS — R6883 Chills (without fever): Secondary | ICD-10-CM | POA: Insufficient documentation

## 2023-11-06 DIAGNOSIS — Z5321 Procedure and treatment not carried out due to patient leaving prior to being seen by health care provider: Secondary | ICD-10-CM | POA: Diagnosis not present

## 2023-11-06 NOTE — ED Triage Notes (Signed)
 Pt in with mid-abdomen pain and vomiting since noon today. Pt reports 10 episodes of emesis and chills

## 2023-11-07 ENCOUNTER — Encounter (HOSPITAL_COMMUNITY): Payer: Self-pay | Admitting: Emergency Medicine

## 2023-11-07 ENCOUNTER — Other Ambulatory Visit: Payer: Self-pay

## 2023-11-07 LAB — COMPREHENSIVE METABOLIC PANEL
ALT: 13 U/L (ref 0–44)
AST: 20 U/L (ref 15–41)
Albumin: 3.7 g/dL (ref 3.5–5.0)
Alkaline Phosphatase: 47 U/L (ref 38–126)
Anion gap: 9 (ref 5–15)
BUN: 9 mg/dL (ref 6–20)
CO2: 22 mmol/L (ref 22–32)
Calcium: 8.6 mg/dL — ABNORMAL LOW (ref 8.9–10.3)
Chloride: 107 mmol/L (ref 98–111)
Creatinine, Ser: 0.77 mg/dL (ref 0.44–1.00)
GFR, Estimated: >60 mL/min (ref >60)
Glucose, Bld: 103 mg/dL — ABNORMAL HIGH (ref 70–99)
Potassium: 3.8 mmol/L (ref 3.5–5.1)
Sodium: 138 mmol/L (ref 135–145)
Total Bilirubin: 0.6 mg/dL (ref 0.0–1.2)
Total Protein: 6.8 g/dL (ref 6.5–8.1)

## 2023-11-07 LAB — URINALYSIS, ROUTINE W REFLEX MICROSCOPIC
Bilirubin Urine: NEGATIVE
Glucose, UA: NEGATIVE mg/dL
Ketones, ur: 5 mg/dL — AB
Leukocytes,Ua: NEGATIVE
Nitrite: NEGATIVE
Protein, ur: NEGATIVE mg/dL
Specific Gravity, Urine: 1.014 (ref 1.005–1.030)
pH: 6 (ref 5.0–8.0)

## 2023-11-07 LAB — CBC
HCT: 36 % (ref 36.0–46.0)
Hemoglobin: 11.9 g/dL — ABNORMAL LOW (ref 12.0–15.0)
MCH: 27.5 pg (ref 26.0–34.0)
MCHC: 33.1 g/dL (ref 30.0–36.0)
MCV: 83.1 fL (ref 80.0–100.0)
Platelets: 317 10*3/uL (ref 150–400)
RBC: 4.33 MIL/uL (ref 3.87–5.11)
RDW: 18.7 % — ABNORMAL HIGH (ref 11.5–15.5)
WBC: 13.8 10*3/uL — ABNORMAL HIGH (ref 4.0–10.5)
nRBC: 0 % (ref 0.0–0.2)

## 2023-11-07 LAB — LIPASE, BLOOD: Lipase: 26 U/L (ref 11–51)

## 2023-11-07 LAB — HCG, SERUM, QUALITATIVE: Preg, Serum: NEGATIVE

## 2023-11-07 MED ORDER — ONDANSETRON 4 MG PO TBDP
4.0000 mg | ORAL_TABLET | Freq: Once | ORAL | Status: AC | PRN
  Administered 2023-11-07: 4 mg via ORAL
  Filled 2023-11-07: qty 1

## 2023-11-07 NOTE — ED Notes (Signed)
 Pt not answering for vital recheck

## 2023-11-07 NOTE — ED Notes (Signed)
 Pt not responding for vitals

## 2023-11-08 ENCOUNTER — Encounter (INDEPENDENT_AMBULATORY_CARE_PROVIDER_SITE_OTHER): Payer: Self-pay | Admitting: Otolaryngology

## 2023-12-04 ENCOUNTER — Encounter: Payer: Medicaid Other | Admitting: Obstetrics

## 2023-12-28 ENCOUNTER — Ambulatory Visit (HOSPITAL_COMMUNITY)

## 2024-01-05 ENCOUNTER — Encounter (INDEPENDENT_AMBULATORY_CARE_PROVIDER_SITE_OTHER): Payer: Self-pay | Admitting: Otolaryngology

## 2024-01-05 ENCOUNTER — Ambulatory Visit (INDEPENDENT_AMBULATORY_CARE_PROVIDER_SITE_OTHER): Payer: Medicaid Other | Admitting: Otolaryngology

## 2024-01-05 VITALS — BP 124/82 | HR 94 | Ht 66.0 in | Wt 135.0 lb

## 2024-01-05 DIAGNOSIS — J312 Chronic pharyngitis: Secondary | ICD-10-CM

## 2024-01-05 DIAGNOSIS — H6990 Unspecified Eustachian tube disorder, unspecified ear: Secondary | ICD-10-CM

## 2024-01-05 DIAGNOSIS — H8110 Benign paroxysmal vertigo, unspecified ear: Secondary | ICD-10-CM

## 2024-01-05 DIAGNOSIS — H9311 Tinnitus, right ear: Secondary | ICD-10-CM | POA: Diagnosis not present

## 2024-01-05 DIAGNOSIS — R0981 Nasal congestion: Secondary | ICD-10-CM

## 2024-01-05 DIAGNOSIS — R42 Dizziness and giddiness: Secondary | ICD-10-CM | POA: Diagnosis not present

## 2024-01-05 DIAGNOSIS — R0982 Postnasal drip: Secondary | ICD-10-CM

## 2024-01-05 DIAGNOSIS — K219 Gastro-esophageal reflux disease without esophagitis: Secondary | ICD-10-CM | POA: Diagnosis not present

## 2024-01-05 DIAGNOSIS — J3089 Other allergic rhinitis: Secondary | ICD-10-CM

## 2024-01-05 MED ORDER — FLUTICASONE PROPIONATE 50 MCG/ACT NA SUSP: 2.0000 | Freq: Two times a day (BID) | NASAL | 6 refills | Status: AC

## 2024-01-05 MED ORDER — LEVOCETIRIZINE DIHYDROCHLORIDE 5 MG PO TABS: 5.0000 mg | ORAL_TABLET | Freq: Every evening | ORAL | 3 refills | Status: AC

## 2024-01-05 NOTE — Progress Notes (Signed)
 ENT CONSULT:  Reason for Consult: intermittent vertigo for several months  HPI: Discussed the use of AI scribe software for clinical note transcription with the patient, who gave verbal consent to proceed.  History of Present Illness Lindsey Castillo is a 33 year old female who presents with intermittent vertigo symptoms and right sided tinnitus/muffled hearing.  She has been experiencing vertigo for about a week, characterized by a sensation of tilting and imbalance, particularly when standing or walking. 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  During her ER visit, a CT head was performed and was normal. 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 These exercises had resolved her symptoms at that time.  She reports frequent earaches, ear leakage, and throat discomfort. She experiences tinnitus in her right ear, sometimes accompanied by an ear ache. She has a history of allergies and takes cetirizine , although not consistently. She has not been tested for allergies as an adult and has not used nasal sprays recently.  No history of head trauma or falls. Her vertigo symptoms are triggered by movement, such as driving or playing with her son or dog, and last up to three minutes before subsiding.   In the review of symptoms, she denies any diagnosed history of heartburn or reflux but mentions difficulty swallowing and occasional nausea. She is uncertain about any changes in her hearing aside from the tinnitus. She feels her hearing is normal.   Records Reviewed:  ED visit note  Lindsey Castillo is a 33 y.o. female presenting to ED with several complaints.  The patient ports she has had intermittent vertigo for several months.  She says this is extremely positional, worse in the morning and with sudden head  movement.  She says she sometimes has fullness in her right ear but no ringing or loss of hearing.  She says she was told she needs to see ENT and is trying to schedule an appointment.  She is concerned because the episodes sometimes occur when she is driving.  She has been having the same vertiginous symptoms since this morning, worse with head movement.   Separately she reports concerns that she has had sharp chest pain intermittent for 3 days.  This pain lasted couple seconds to a few minutes and then resolved.  It occurred earlier this morning.  She has never had the symptoms before.  She reports that radiates from the middle of her chest to her left shoulder.  It is not pleuritic.   Comments: Patient demonstrating active vertigo symptoms and nystagmus Head impulse shows corrective saccades Unilateral horizontal nystagmus present. Test of skew shows no skew    1. vertigo which I suspect is likely peripheral vertigo given her hints exam and description of her symptoms.  I have a much lower suspicion for central vertigo including demyelinating lesion or stroke.  Patient did have CT scan performed from triage which I reviewed personally showing no acute abnormalities.  Her ear exam is unremarkable, low suspicion for Mnire's disease or ear infection.  I will give her oral meclizine  here and prescribed this for home and I encouraged her to follow-up again with the ENT for suspected BPPV.   2.  Chest pain and nausea, which is most likely related to reflux gastritis or nonspecific chest pain.  I have  a very low suspicion for acute PE, as the pain is intermittent and she has not tachycardic or hypoxic, no other risk factors for PE.  I reviewed the patient's labs including a troponin which is undetectable, chest x-ray which is unremarkable per my review.  Doubt pneumothorax or ACS or pericarditis.  Doubt pneumonia.  She can follow-up with her PCP for this issue, patient is stable for discharge from this  perspective.   Her EKG per my interpretation shows no acute ischemic findings  Past Medical History:  Diagnosis Date   Abnormal Pap smear    last pap 07/2012   Allergy    Anemia    Anxiety    Asthma    USES INHALER   Depression    Eczema    Fracture, ankle AGE 29   Headache(784.0)    Infection    trich   Infection    BV   Infection    HX OF FREQUENT   NSVD (normal spontaneous vaginal delivery) 01/01/2013   SVD at 1525   Seasonal allergies     Past Surgical History:  Procedure Laterality Date   FOOT SURGERY Right    "15 splinters in my foot"    Family History  Problem Relation Age of Onset   Hypertension Father    Heart disease Father    Thyroid disease Father    Alcoholism Father    Asthma Father    Cancer Maternal Grandmother    Diabetes Maternal Grandmother    Kidney disease Maternal Grandmother    Arthritis Maternal Grandmother    Hypertension Maternal Grandmother    Depression Mother    Mental illness Mother    Birth defects Sister        HEART MURMUR   Asthma Brother    Miscarriages / [place] Cousin    Thyroid disease Sister     Social History:  reports that she has been smoking cigarettes. She started smoking about 22 years ago. She has a 11 pack-year smoking history. She has never used smokeless tobacco. She reports current alcohol use of about 56.0 standard drinks of alcohol per week. She reports that she does not currently use drugs.  Allergies:  Allergies  Allergen Reactions   Coconut Fatty Acid Itching and Swelling    Medications: I have reviewed the patient's current medications.  The PMH, PSH, Medications, Allergies, and SH were reviewed and updated.  ROS: Constitutional: Negative for fever, weight loss and weight gain. Cardiovascular: Negative for chest pain and dyspnea on exertion. Respiratory: Is not experiencing shortness of breath at rest. Gastrointestinal: Negative for nausea and vomiting. Neurological: Negative for  headaches. Psychiatric: The patient is not nervous/anxious  Blood pressure 124/82, pulse 94, height 5\' 6"  (1.676 m), weight 135 lb (61.2 kg), SpO2 98%.  PHYSICAL EXAM:  Exam: General: Well-developed, well-nourished Communication and Voice: Clear pitch and clarity Respiratory Respiratory effort: Equal inspiration and expiration without stridor Cardiovascular Peripheral Vascular: Warm extremities with equal color/perfusion Eyes: No nystagmus with equal extraocular motion bilaterally Neuro/Psych/Balance: Patient oriented to person, place, and time; Appropriate mood and affect; Gait is intact with no imbalance; Cranial nerves I-XII are intact. No nystagmus.  Head and Face Inspection: Normocephalic and atraumatic without mass or lesion Palpation: Facial skeleton intact without bony stepoffs Salivary Glands: No mass or tenderness Facial Strength: Facial motility symmetric and full bilaterally ENT Pinna: External ear intact and fully developed External canal: Canal is patent with intact skin Tympanic Membrane: Clear and mobile External Nose: No scar or  anatomic deformity Internal Nose: Septum intact and midline. No edema, polyp, or rhinorrhea Lips, Teeth, and gums: Mucosa and teeth intact and viable TMJ: No pain to palpation with full mobility Oral cavity/oropharynx: No erythema or exudate, no lesions present Neck Neck and Trachea: Midline trachea without mass or lesion Thyroid: No mass or nodularity Lymphatics: No lymphadenopathy  Studies Reviewed: CT head 06/19/23 FINDINGS: Brain: No intracranial hemorrhage, mass effect, or midline shift. No hydrocephalus. The basilar cisterns are patent. No evidence of territorial infarct or acute ischemia. No extra-axial or intracranial fluid collection.   Vascular: No hyperdense vessel or unexpected calcification.   Skull: No fracture or focal lesion.   Sinuses/Orbits: Paranasal sinuses and mastoid air cells are clear. The visualized  orbits are unremarkable.   Other: None.   IMPRESSION: Negative noncontrast head CT 06/18/24  Assessment/Plan: Encounter Diagnoses  Name Primary?   Benign paroxysmal positional vertigo, unspecified laterality Yes   Vertigo    Environmental and seasonal allergies    Post-nasal drip    Chronic nasal congestion    Tinnitus of right ear    Chronic sore throat    Dysfunction of Eustachian tube, unspecified laterality    Chronic GERD     Assessment and Plan Assessment & Plan Intermittent vertigo triggered by changes in body position/movement, lasting seconds to minutes. Suspect benign paroxysmal positional vertigo, had Epley maneuvers for similar sx and sx resolved back then. Also reports R tinnitus, but denies hearing changes. Negative CT head in ED. No nystagmus on exam today and normal gait.  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 - Refer to vestibular therapist for treatment.  R tinnitus suspect Eustachian tube dysfunction Ringing and earaches possibly due to eustachian tube dysfunction, exacerbated by congestion and allergies.Normal ear exam, no hx of fevers, ead drainage.  - Order hearing test.  Chronic nasal congestion and suspected Environmental Allergies  Nasal congestion and postnasal drainage contributing to eustachian tube dysfunction and throat irritation. - Prescribed Flonase  nasal spray twice daily. - Prescribed Xyzal at night. - Refer for allergy testing.  GERD (Gastroesophageal reflux disease)  Nausea, dysphagia, and throat irritation suggestive of GERD. - Recommend lifestyle and dietary changes. - Suggest Reflux Gourmet after meals. - will consider scoping when she returns   Referrals to Audiology, Allergy, Vestibular PT RTC 3 mo  Thank you for allowing me to participate in the care of this patient. Please do not hesitate to contact me with any questions or concerns.   Artice Last,  MD Otolaryngology [facility] Phone: [phone] Fax: [phone]    01/05/2024, 10:53 AM

## 2024-01-05 NOTE — Patient Instructions (Signed)

## 2024-01-28 NOTE — Progress Notes (Signed)
 New Patient Note  RE: Lindsey Castillo MRN: [id number] DOB: [birth year] Date of Office Visit: 01/29/2024  Consult requested by: Artice Last, MD Primary care provider: Medicine, [facility]  Chief Complaint: Allergic Rhinitis  (Itchy throat and eyes,congestion drainage - currently on xyzal  was on zyrtec  ), Allergic Reaction (Throat swelling from coconut - no issues with tree nuts ), Eczema (Has not had a flare for 5-6 years ), and Asthma (No issues - anxiety attacks can cause a flare, but is rare )  History of Present Illness: I had the pleasure of seeing Lindsey Castillo for initial evaluation at the [facility] on 01/29/2024. She is a 33 y.o. female, who is referred here by Medicine, [facility] for the evaluation of environmental allergies.  Discussed the use of AI scribe software for clinical note transcription with the patient, who gave verbal consent to proceed.    She experiences random episodes of dizziness and vertigo, particularly affecting her right ear, which started approximately a year and a half ago. She also has ear drainage and a history of sinus issues, including stuffy nose, itchy throat, postnasal drip, and itchy, watery eyes. These symptoms have been present all her life, with exacerbations typically occurring at the end of spring into summer.  She takes a medication for nausea and dizziness daily, though she cannot recall the name. For her allergies, she was previously taking cetirizine  but recently switched to levocetirizine. She also uses Flonase  nasal spray and over-the-counter allergy eye drops.  She has a history of asthma, which she manages with an as-needed inhaler. She last used it about three months ago. Her asthma can be triggered by overexertion, anxiety, and her dog. She has not required emergency care for asthma in the past year.  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 She avoids coconut and has not had formal allergy testing for it.  She experiences eczema, primarily in the creases of her elbows, behind her knees, and on her stomach. She previously used triamcinolone cream but has not used it in years. Her eczema is better since she changed her laundry detergent and started avoiding nickel.  She smokes a pack of cigarettes a day and lives in a duplex with two dogs and a cat. She works as a Administrator for [employer]. There is no water or mold damage in her home, and she has central air and electric heat.  She experiences heartburn/reflux but does not take medication for it. She has not had any recent sinus infections or surgeries, and she has not seen a neurologist for her dizziness and headaches.     She reports symptoms of dizziness, ear drainage, nasal congestion, itchy throat, PND, itchy/watery eyes. Symptoms have been going on for many years. The symptoms are present all year around with worsening in spring. Anosmia: no. Headache: yes. She has used xyzal , Flonase  with some improvement in symptoms. Sinus infections: yes in the past. Previous work up includes: none. Previous ENT evaluation: yes - no prior sinus surgery. Last eye exam: in 2024. History of reflux: yes.  01/05/2024 ENT visit: "Intermittent vertigo triggered by changes in body position/movement, lasting seconds to minutes. Suspect benign paroxysmal positional vertigo, had Epley maneuvers for similar sx and sx resolved back then. Also reports R tinnitus, but denies hearing changes. Negative CT head in ED. No nystagmus on exam today and normal gait.  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 - Refer to vestibular therapist for treatment.   R tinnitus suspect Eustachian tube dysfunction Ringing and earaches possibly due to eustachian tube dysfunction, exacerbated by congestion and allergies.Normal ear exam, no hx of fevers, ead  drainage.  - Order hearing test.   Chronic nasal congestion and suspected Environmental Allergies  Nasal congestion and postnasal drainage contributing to eustachian tube dysfunction and throat irritation. - Prescribed Flonase  nasal spray twice daily. - Prescribed Xyzal  at night. - Refer for allergy testing.   GERD (Gastroesophageal reflux disease)  Nausea, dysphagia, and throat irritation suggestive of GERD. - Recommend lifestyle and dietary changes. - Suggest Reflux Gourmet after meals. - will consider scoping when she returns"  Assessment and Plan: Lindsey Castillo is a 33 y.o. female with: Other allergic rhinitis Allergic conjunctivitis of both eyes Chronic allergic rhinitis with perennial symptoms, exacerbated in late spring to early summer. No prior allergy testing Return for allergy skin testing. Will make additional recommendations based on results. If significant positives will recommend allergy injections.  Use Flonase  (fluticasone ) nasal spray 1-2 sprays per nostril once a day as needed for nasal congestion.  Nasal saline spray (i.e., Simply Saline) or nasal saline lavage (i.e., NeilMed) is recommended as needed and prior to medicated nasal sprays. Use over the counter antihistamines such as Zyrtec  (cetirizine ), Claritin (loratadine), Allegra (fexofenadine), or Xyzal  (levocetirizine) daily as needed. May take twice a day during allergy flares. May switch antihistamines every few months. Stop 3 days before skin testing.  Dizziness Vertigo with dizziness and ear drainage, particularly in the right ear. ENT evaluation ongoing. Please follow up with ENT's recommendations.   Mild intermittent asthma without complication Intermittent asthma, well-controlled with as-needed albuterol  inhaler. Symptoms triggered by overexertion, anxiety, and pet exposure. Today's spirometry was normal.  May use albuterol  rescue inhaler 2 puffs every 4 to 6 hours as needed for shortness of breath, chest  tightness, coughing, and wheezing. May use albuterol  rescue inhaler 2 puffs 5 to 15 minutes prior to strenuous physical activities. Monitor frequency of use - if you need to use it more than twice per week on a consistent basis let us  know.  Recommend to stop smoking.  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 No formal allergy testing conducted. Continue to avoid coconut. Check via skin testing at next visit.  For mild symptoms you can take over the counter antihistamines such as Benadryl  1-2 tablets = 25-50mg  and monitor symptoms closely.  If symptoms worsen or if you have severe symptoms including breathing issues, throat closure, significant swelling, whole body hives, severe diarrhea and vomiting, lightheadedness then seek immediate medical care.  Atopic dermatitis Intermittent flares, primarily affecting the creases of elbows, back of knees, and stomach. Well-controlled with changes in laundry detergent and avoidance of nickel. Keep track of rashes and take pictures. See below for proper skin care. Use fragrance free and dye free products. No dryer sheets or fabric softener.    Gastroesophageal reflux disease, unspecified whether esophagitis present See handout for lifestyle and dietary modifications.  Return for Skin testing.  Meds ordered this encounter  Medications   albuterol  (VENTOLIN  HFA) 108 (90 Base) MCG/ACT inhaler    Sig: Inhale 2 puffs into the lungs every 4 (four) hours as needed for wheezing or shortness of breath (coughing fits).    Dispense:  18 g    Refill:  1   Lab Orders  No laboratory test(s) ordered today  Other allergy screening: Asthma: yes Using albuterol  prn Triggers include anxiety/stress, possibly dogs.  Food allergy: yes Coconut caused throat swelling and required a shot? Medication allergy: no Hymenoptera allergy: no Urticaria: no Eczema:yes improved History of recurrent infections suggestive of  immunodeficency: no  Diagnostics: Spirometry:  Tracings reviewed. Her effort: Good reproducible efforts. FVC: 3.30L FEV1: 3.18L, 112% predicted FEV1/FVC ratio: 96% Interpretation: Spirometry consistent with normal pattern.  Please see scanned spirometry results for details.  Results discussed with patient/family.   Past Medical History: Patient Active Problem List   Diagnosis Date Noted   Major depressive disorder, recurrent episode (HCC) 02/24/2021   Suicidal overdose (HCC) 02/23/2021   Major depression in full remission (HCC) 02/11/2021   Allergic rhinitis 01/22/2021   Attention deficit disorder (ADD) in adult 01/21/2021   Generalized anxiety disorder 12/17/2020   History of alcohol abuse 12/17/2020   Panic disorder 12/17/2020   Rhabdomyolysis 04/03/2017   Anemia 04/03/2017   Tobacco abuse 04/03/2017   Vaginal discharge 04/03/2017   Gonorrhea in female 09/14/2016   Colitis, acute 09/14/2016   SIRS (systemic inflammatory response syndrome) (HCC) 09/12/2016   Right lower quadrant abdominal pain 09/12/2016   NSVD (normal spontaneous vaginal delivery) 01/01/2013   Asthma 08/02/2012   Eczema 08/02/2012   Past Medical History:  Diagnosis Date   Abnormal Pap smear    last pap 07/2012   Allergy    Anemia    Anxiety    Asthma    USES INHALER   Depression    Eczema    Fracture, ankle AGE 82   Headache(784.0)    Infection    trich   Infection    BV   Infection    HX OF FREQUENT   NSVD (normal spontaneous vaginal delivery) 01/01/2013   SVD at 1525   Seasonal allergies    Past Surgical History: Past Surgical History:  Procedure Laterality Date   FOOT SURGERY Right    "15 splinters in my foot"   Medication List:  Current Outpatient Medications  Medication Sig Dispense Refill   albuterol  (VENTOLIN  HFA) 108 (90 Base) MCG/ACT inhaler Inhale 2 puffs into the lungs every 4 (four) hours as needed for wheezing or shortness of breath (coughing fits). 18 g 1    cetirizine  (ZYRTEC ) 10 MG tablet Take 1 tablet (10 mg total) by mouth daily. 90 tablet 0   ferrous sulfate  324 (65 Fe) MG TBEC Take 324 mg by mouth.     fluticasone  (FLONASE ) 50 MCG/ACT nasal spray Place 2 sprays into both nostrils 2 (two) times daily. 16 g 6   levocetirizine (XYZAL  ALLERGY 24HR) 5 MG tablet Take 1 tablet (5 mg total) by mouth every evening. 30 tablet 3   meclizine  (ANTIVERT ) 25 MG tablet Take 1 tablet (25 mg total) by mouth 3 (three) times daily as needed for up to 30 doses for dizziness. 30 tablet 0   ondansetron  (ZOFRAN ) 4 MG tablet Take 1 tablet (4 mg total) by mouth every 8 (eight) hours as needed for nausea or vomiting. 20 tablet 0   No current facility-administered medications for this visit.   Allergies: Allergies  Allergen Reactions   Cocamide Itching and Swelling   Coconut Fatty Acid Itching and Swelling   Social History: Social History   Socioeconomic History   Marital status: Single    Spouse name: Not on file   Number of children: 1   Years of education: 12   Highest education level: Not on file  Occupational History  Occupation: Conservation officer, nature  Tobacco Use   Smoking status: Every Day    Current packs/day: 0.00    Average packs/day: 1 pack/day for 11.0 years (11.0 ttl pk-yrs)    Types: Cigarettes    Start date: 05/24/2001    Last attempt to quit: 05/24/2012    Years since quitting: 11.6   Smokeless tobacco: Never  Vaping Use   Vaping status: Never Used  Substance and Sexual Activity   Alcohol use: Yes    Alcohol/week: 56.0 standard drinks of alcohol    Types: 56 Cans of beer per week    Comment: drinks (4) 24-oz beers a day   Drug use: Not Currently   Sexual activity: Yes    Partners: Male    Birth control/protection: None    Comment: Last encounter: [id number]  Other Topics Concern   Not on file  Social History Narrative   Pt lives with grandmother and 65 year old son. Pt works at [employer]. Dealing with financial, occupational and family stresses.    Social Drivers of Health   Financial Resource Strain: At Risk (01/13/2023)   Received from [facility]    Financial Resource Strain: 2  Food Insecurity: Not on file (01/13/2023)  Transportation Needs: Not at Risk (01/13/2023)   Received from [facility] Needs    Transportation: 1  Physical Activity: Not on File (12/30/2021)   Received from [place], [place]   Physical Activity    Physical Activity: 0  Stress: Not on File (12/30/2021)   Received from [facility], [place]   Stress    Stress: 0  Social Connections: Not on File (05/17/2023)   Received from [facility]    Connectedness: 0   Lives in a duplex Smoking: 1ppd x day. Occupation: Hospital doctor.   Environmental History: Water Damage/mildew in the house: no Carpet in the family room: no Carpet in the bedroom: yes Heating: electric Cooling: central Pet: yes 2 dogs x 4 yrs, 1 cat x 1.5 yrs  Family History: Family History  Problem Relation Age of Onset   Hypertension Father    Heart disease Father    Thyroid disease Father    Alcoholism Father    Asthma Father    Cancer Maternal Grandmother    Diabetes Maternal Grandmother    Kidney disease Maternal Grandmother    Arthritis Maternal Grandmother    Hypertension Maternal Grandmother    Depression Mother    Mental illness Mother    Birth defects Sister        HEART MURMUR   Asthma Brother    Miscarriages / [place] Cousin    Thyroid disease Sister     Review of Systems  Constitutional:  Negative for appetite change, chills, fever and unexpected weight change.  HENT:  Positive for congestion, ear discharge, ear pain, postnasal drip, rhinorrhea and sneezing.   Eyes:  Negative for itching.  Respiratory:  Negative for cough, chest tightness, shortness of breath and wheezing.   Cardiovascular:  Negative for chest pain.  Gastrointestinal:  Negative for abdominal pain.  Genitourinary:  Negative for difficulty urinating.  Skin:   Positive for rash.  Neurological:  Positive for dizziness and headaches.    Objective: BP 118/74 (BP Location: Right Arm, Patient Position: Sitting, Cuff Size: Normal)   Pulse 94   Temp 98.3 F (36.8 C) (Temporal)   Resp 18   Ht 5\' 5"  (1.651 m)   Wt 142 lb 14.4 oz (64.8 kg)   SpO2  99%   BMI 23.78 kg/m  Body mass index is 23.78 kg/m. Physical Exam Vitals and nursing note reviewed.  Constitutional:      Appearance: Normal appearance. She is well-developed.  HENT:     Head: Normocephalic and atraumatic.     Right Ear: Tympanic membrane and external ear normal.     Left Ear: Tympanic membrane and external ear normal.     Nose: Nose normal.     Mouth/Throat:     Mouth: Mucous membranes are moist.     Pharynx: Oropharynx is clear.  Eyes:     Conjunctiva/sclera: Conjunctivae normal.  Cardiovascular:     Rate and Rhythm: Normal rate and regular rhythm.     Heart sounds: Normal heart sounds. No murmur heard.    No friction rub. No gallop.  Pulmonary:     Effort: Pulmonary effort is normal.     Breath sounds: Normal breath sounds. No wheezing, rhonchi or rales.  Musculoskeletal:     Cervical back: Neck supple.  Skin:    General: Skin is warm.     Findings: No rash.  Neurological:     Mental Status: She is alert and oriented to person, place, and time.  Psychiatric:        Behavior: Behavior normal.   The plan was reviewed with the patient/family, and all questions/concerned were addressed.  It was my pleasure to see Lindsey Castillo today and participate in her care. Please feel free to contact me with any questions or concerns.  Sincerely,  Eudelia Hero, DO Allergy & Immunology  [facility]  [place] office: [phone] [facility] office: [phone]

## 2024-01-29 ENCOUNTER — Other Ambulatory Visit: Payer: Self-pay

## 2024-01-29 ENCOUNTER — Ambulatory Visit: Admitting: Physical Therapy

## 2024-01-29 ENCOUNTER — Ambulatory Visit: Payer: Self-pay | Admitting: Allergy

## 2024-01-29 ENCOUNTER — Encounter: Payer: Self-pay | Admitting: Allergy

## 2024-01-29 VITALS — BP 118/74 | HR 94 | Temp 98.3°F | Resp 18 | Ht 65.0 in | Wt 142.9 lb

## 2024-01-29 DIAGNOSIS — Z720 Tobacco use: Secondary | ICD-10-CM

## 2024-01-29 DIAGNOSIS — H1013 Acute atopic conjunctivitis, bilateral: Secondary | ICD-10-CM

## 2024-01-29 DIAGNOSIS — J4520 Mild intermittent asthma, uncomplicated: Secondary | ICD-10-CM

## 2024-01-29 DIAGNOSIS — J3089 Other allergic rhinitis: Secondary | ICD-10-CM

## 2024-01-29 DIAGNOSIS — R42 Dizziness and giddiness: Secondary | ICD-10-CM

## 2024-01-29 DIAGNOSIS — K219 Gastro-esophageal reflux disease without esophagitis: Secondary | ICD-10-CM | POA: Diagnosis not present

## 2024-01-29 DIAGNOSIS — L2089 Other atopic dermatitis: Secondary | ICD-10-CM

## 2024-01-29 DIAGNOSIS — T781XXD Other adverse food reactions, not elsewhere classified, subsequent encounter: Secondary | ICD-10-CM

## 2024-01-29 MED ORDER — ALBUTEROL SULFATE HFA 108 (90 BASE) MCG/ACT IN AERS
2.0000 | INHALATION_SPRAY | RESPIRATORY_TRACT | 1 refills | Status: AC | PRN
Start: 2024-01-29 — End: ?

## 2024-01-29 NOTE — Patient Instructions (Addendum)
 Rhinitis  Return for allergy skin testing. Will make additional recommendations based on results. If significant positives will recommend allergy injections.  Make sure you don't take any antihistamines for 3 days before the skin testing appointment. Don't put any lotion on the back and arms on the day of testing.  Plan on being here for 30-60 minutes.   Use Flonase  (fluticasone ) nasal spray 1-2 sprays per nostril once a day as needed for nasal congestion.  Nasal saline spray (i.e., Simply Saline) or nasal saline lavage (i.e., NeilMed) is recommended as needed and prior to medicated nasal sprays. Use over the counter antihistamines such as Zyrtec  (cetirizine ), Claritin (loratadine), Allegra (fexofenadine), or Xyzal  (levocetirizine) daily as needed. May take twice a day during allergy flares. May switch antihistamines every few months. Stop 3 days before skin testing.  Dizziness Please follow up with ENT's recommendations.   Asthma Today's spirometry was normal.  May use albuterol  rescue inhaler 2 puffs every 4 to 6 hours as needed for shortness of breath, chest tightness, coughing, and wheezing. May use albuterol  rescue inhaler 2 puffs 5 to 15 minutes prior to strenuous physical activities. Monitor frequency of use - if you need to use it more than twice per week on a consistent basis let us  know.  Recommend to stop smoking.  Food Continue to avoid coconut. Check via skin testing at next visit.  For mild symptoms you can take over the counter antihistamines such as Benadryl  1-2 tablets = 25-50mg  and monitor symptoms closely.  If symptoms worsen or if you have severe symptoms including breathing issues, throat closure, significant swelling, whole body hives, severe diarrhea and vomiting, lightheadedness then seek immediate medical care.  Heartburn See handout for lifestyle and dietary modifications.  Eczema  Keep track of rashes and take pictures. See below for proper skin care. Use  fragrance free and dye free products. No dryer sheets or fabric softener.    Follow up for skin testing.   Skin care recommendations  Bath time: Always use lukewarm water. AVOID very hot or cold water. Keep bathing time to 5-10 minutes. Do NOT use bubble bath. Use a mild soap and use just enough to wash the dirty areas. Do NOT scrub skin vigorously.  After bathing, pat dry your skin with a towel. Do NOT rub or scrub the skin.  Moisturizers and prescriptions:  ALWAYS apply moisturizers immediately after bathing (within 3 minutes). This helps to lock-in moisture. Use the moisturizer several times a day over the whole body. Good summer moisturizers include: Aveeno, CeraVe, Cetaphil. Good winter moisturizers include: Aquaphor, Vaseline, Cerave, Cetaphil, Eucerin, Vanicream. When using moisturizers along with medications, the moisturizer should be applied about one hour after applying the medication to prevent diluting effect of the medication or moisturize around where you applied the medications. When not using medications, the moisturizer can be continued twice daily as maintenance.  Laundry and clothing: Avoid laundry products with added color or perfumes. Use unscented hypo-allergenic laundry products such as Tide free, Cheer free & gentle, and All free and clear.  If the skin still seems dry or sensitive, you can try double-rinsing the clothes. Avoid tight or scratchy clothing such as wool. Do not use fabric softeners or dyer sheets.

## 2024-02-03 ENCOUNTER — Telehealth: Admitting: Family Medicine

## 2024-02-03 DIAGNOSIS — J301 Allergic rhinitis due to pollen: Secondary | ICD-10-CM | POA: Diagnosis not present

## 2024-02-03 MED ORDER — PREDNISONE 20 MG PO TABS: 20.0000 mg | ORAL_TABLET | Freq: Two times a day (BID) | ORAL | 0 refills | Status: AC

## 2024-02-03 NOTE — Progress Notes (Signed)
 E visit for Allergic Rhinitis We are sorry that you are not feeling well.  Here is how we plan to help!  Based on what you have shared with me it looks like you have Allergic Rhinitis.  Rhinitis is when a reaction occurs that causes nasal congestion, runny nose, sneezing, and itching.  Most types of rhinitis are caused by an inflammation and are associated with symptoms in the eyes ears or throat. There are several types of rhinitis.  The most common are acute rhinitis, which is usually caused by a viral illness, allergic or seasonal rhinitis, and nonallergic or year-round rhinitis.  Nasal allergies occur certain times of the year.  Allergic rhinitis is caused when allergens in the air trigger the release of histamine in the body.  Histamine causes itching, swelling, and fluid to build up in the fragile linings of the nasal passages, sinuses and eyelids.  An itchy nose and clear discharge are common.  I recommend the following over the counter treatments: You should take a daily dose of antihistamine  I also would recommend a nasal spray: Flonase  2 sprays into each nostril once daily  I will also send you prednisone  Urgent care if sx worsen.   HOME CARE:  You can use an over-the-counter saline nasal spray as needed Avoid areas where there is heavy dust, mites, or molds Stay indoors on windy days during the pollen season Keep windows closed in home, at least in bedroom; use air conditioner. Use high-efficiency house air filter Keep windows closed in car, turn AC on re-circulate Avoid playing out with dog during pollen season  GET HELP RIGHT AWAY IF:  If your symptoms do not improve within 10 days You become short of breath You develop yellow or green discharge from your nose for over 3 days You have coughing fits  MAKE SURE YOU:  Understand these instructions Will watch your condition Will get help right away if you are not doing well or get worse  Thank you for choosing an  e-visit. Your e-visit answers were reviewed by a board certified advanced clinical practitioner to complete your personal care plan. Depending upon the condition, your plan could have included both over the counter or prescription medications. Please review your pharmacy choice. Be sure that the pharmacy you have chosen is open so that you can pick up your prescription now.  If there is a problem you may message your provider in MyChart to have the prescription routed to another pharmacy. Your safety is important to us . If you have drug allergies check your prescription carefully.  For the next 24 hours, you can use MyChart to ask questions about today's visit, request a non-urgent call back, or ask for a work or school excuse from your e-visit provider. You will get an email in the next two days asking about your experience. I hope that your e-visit has been valuable and will speed your recovery.   have provided 5 minutes of non face to face time during this encounter for chart review and documentation.

## 2024-02-06 ENCOUNTER — Ambulatory Visit: Attending: Otolaryngology | Admitting: Audiologist

## 2024-02-14 ENCOUNTER — Ambulatory Visit

## 2024-02-14 ENCOUNTER — Encounter: Payer: Self-pay | Admitting: Allergy

## 2024-02-14 ENCOUNTER — Ambulatory Visit: Admitting: Allergy

## 2024-02-14 DIAGNOSIS — J3089 Other allergic rhinitis: Secondary | ICD-10-CM

## 2024-02-14 DIAGNOSIS — H1013 Acute atopic conjunctivitis, bilateral: Secondary | ICD-10-CM

## 2024-02-14 DIAGNOSIS — K219 Gastro-esophageal reflux disease without esophagitis: Secondary | ICD-10-CM

## 2024-02-14 DIAGNOSIS — T781XXD Other adverse food reactions, not elsewhere classified, subsequent encounter: Secondary | ICD-10-CM | POA: Diagnosis not present

## 2024-02-14 DIAGNOSIS — R42 Dizziness and giddiness: Secondary | ICD-10-CM

## 2024-02-14 DIAGNOSIS — J4520 Mild intermittent asthma, uncomplicated: Secondary | ICD-10-CM

## 2024-02-14 DIAGNOSIS — L2089 Other atopic dermatitis: Secondary | ICD-10-CM

## 2024-02-14 MED ORDER — CROMOLYN SODIUM 4 % OP SOLN: 1.0000 [drp] | Freq: Four times a day (QID) | OPHTHALMIC | 3 refills | Status: AC | PRN

## 2024-02-14 NOTE — Progress Notes (Signed)
 Skin testing note  RE: Lindsey Castillo MRN: [id number] DOB: [birth year] Date of Office Visit: 02/14/2024  Referring provider: [facility], [facility] Primary care provider: [facility], [facility]  Chief Complaint: skin testing  History of Present Illness: I had the pleasure of seeing Lindsey Castillo for a skin testing visit at the [facility] on 02/14/2024. She is a 33 y.o. female, who is being followed for allergic rhinoconjunctivitis, asthma, adverse food reaction, atopic dermatitis, dizziness and GERD. Her previous allergy office visit was on 01/29/2024 with Dr. Burdette Carolin. Today is a skin testing visit.   Discussed the use of AI scribe software for clinical note transcription with the patient, who gave verbal consent to proceed.    She has allergies to pollen, including tree, grass, ragweed, and weed pollen, as well as indoor allergens such as dust mites, cats, and dogs. She currently has one cat and two dogs at home, which may exacerbate her symptoms. She uses allergy pills and nasal sprays for symptom management. She previously used eye drops but has not had a refill and needs them again. No contact lens use is reported.  She has a history of asthma and uses an inhaler for management. Additionally, she has eczema, which is currently well-controlled with no recent flare-ups.  She experienced a severe allergic reaction to coconut, which required an emergency room visit. Although her skin test for coconut was negative, she is awaiting blood work to further evaluate this allergy.     Assessment and Plan: Lindsey Castillo is a 33 y.o. female with: Other allergic rhinitis Allergic conjunctivitis of both eyes Past history - Chronic allergic rhinitis with perennial symptoms, exacerbated in late spring to early summer.  Today's skin testing positive to grass, ragweed, weed pollen, trees, dust mites, cat, dog. Start environmental control measures as below. Use over the counter  antihistamines such as Zyrtec  (cetirizine ), Claritin (loratadine), Allegra (fexofenadine), or Xyzal  (levocetirizine) daily as needed. May take twice a day during allergy flares. May switch antihistamines every few months. Recommend allergy injections. 2 injections. Let us  know when ready to start. You have to make first shot appointment.  Had a detailed discussion with patient/family that clinical history is suggestive of allergic rhinitis, and may benefit from allergy immunotherapy (AIT). Discussed in detail regarding the dosing, schedule, side effects (mild to moderate local allergic reaction and rarely systemic allergic reactions including anaphylaxis), and benefits (significant improvement in nasal symptoms, seasonal flares of asthma) of immunotherapy with the patient. There is significant time commitment involved with allergy shots, which includes weekly immunotherapy injections for first 9-12 months and then biweekly to monthly injections for 3-5 years. Consent was signed. Use Flonase  (fluticasone ) nasal spray 1-2 sprays per nostril once a day as needed for nasal congestion.  Nasal saline spray (i.e., Simply Saline) or nasal saline lavage (i.e., NeilMed) is recommended as needed and prior to medicated nasal sprays. Use cromolyn 4% 1 drop in each eye up to four times a day as needed for itchy/watery eyes.    Dizziness Past history - Vertigo with dizziness and ear drainage, particularly in the right ear. ENT evaluation ongoing. Please follow up with ENT's recommendations.    Mild intermittent asthma without complication Past history - Intermittent asthma, well-controlled with as-needed albuterol  inhaler. Symptoms triggered by overexertion, anxiety, and pet exposure. 2025 spirometry normal.  May use albuterol  rescue inhaler 2 puffs every 4 to 6 hours as needed for shortness of breath, chest tightness, coughing, and wheezing. May  use albuterol  rescue inhaler 2 puffs 5 to 15 minutes prior to  strenuous physical activities. Monitor frequency of use - if you need to use it more than twice per week on a consistent basis let us  know.  Recommend to stop smoking.   Coconut Past history - Throat swelling after coconut ingestion, requiring emergency treatment. No formal allergy testing conducted. Today's skin testing negative to coconut. Get bloodwork. If negative will plan on in office food challenge next. For mild symptoms you can take over the counter antihistamines such as Zyrtec  10mg  to 20mg  and monitor symptoms closely.  If symptoms worsen or if you have severe symptoms including breathing issues, throat closure, significant swelling, whole body hives, severe diarrhea and vomiting, lightheadedness then seek immediate medical care.   Atopic dermatitis Past history - Intermittent flares, primarily affecting the creases of elbows, back of knees, and stomach. Well-controlled with changes in laundry detergent and avoidance of nickel. Keep track of rashes and take pictures. See below for proper skin care. Use fragrance free and dye free products. No dryer sheets or fabric softener.     Gastroesophageal reflux disease, unspecified whether esophagitis present Continue lifestyle and dietary modifications.   Return in about 4 months (around 06/15/2024).  Meds ordered this encounter  Medications   cromolyn (OPTICROM) 4 % ophthalmic solution    Sig: Place 1 drop into both eyes 4 (four) times daily as needed (itchy/watery eyes).    Dispense:  10 mL    Refill:  3   Lab Orders         Coconut IgE      Diagnostics: Skin Testing: Environmental allergy panel. Today's skin testing positive to grass, ragweed, weed pollen, trees, dust mites, cat, dog. Negative to coconut. Results discussed with patient/family.  Airborne Adult Perc - 02/14/24 0935     Time Antigen Placed 1610    Allergen Manufacturer Floyd Hutchinson    Location Back    Number of Test 55    1. Control-Buffer 50% Glycerol  Negative    2. Control-Histamine 3+    3. Bahia Negative    4. French Southern Territories Negative    5. Johnson Negative    6. [place]  Blue 3+    7. Meadow Fescue Negative    8. Perennial Rye 2+    9. Timothy Negative    10. Ragweed Mix Negative    11. Cocklebur Negative    12. Plantain,  English Negative    13. Baccharis Negative    14. Dog Fennel Negative    15. Russian Thistle Negative    16. Lamb's Quarters Negative    17. Sheep Sorrell 2+    18. Rough Pigweed Negative    19. Marsh Elder, Rough Negative    20. Mugwort, Common 2+    21. Box, Elder Negative    22. Cedar, red Negative    23. Sweet Gum Negative    24. Pecan Pollen 4+    25. Pine Mix Negative    26. Walnut, Black Pollen Negative    27. Red Mulberry Negative    28. Ash Mix Negative    29. Birch Mix Negative    30. Beech American Negative    31. Cottonwood, Guinea-Bissau Negative    32. Hickory, White 4+    33. Maple Mix Negative    34. Oak, Guinea-Bissau Mix Negative    35. Sycamore Eastern Negative    36. Alternaria Alternata Negative    37. Cladosporium Herbarum Negative    38. Aspergillus  Mix Negative    39. Penicillium Mix Negative    40. Bipolaris Sorokiniana (Helminthosporium) Negative    41. Drechslera Spicifera (Curvularia) Negative    42. Mucor Plumbeus Negative    43. Fusarium Moniliforme Negative    44. Aureobasidium Pullulans (pullulara) Negative    45. Rhizopus Oryzae Negative    46. Botrytis Cinera Negative    47. Epicoccum Nigrum Negative    48. Phoma Betae Negative    49. Dust Mite Mix Negative    50. Cat Hair 10,000 BAU/ml 2+    51.  Dog Epithelia Negative    52. Mixed Feathers Negative    53. Horse Epithelia Negative    54. Cockroach, German Negative    55. Tobacco Leaf Negative             Intradermal - 02/14/24 1029     Time Antigen Placed 1029    Allergen Manufacturer Greer    Location Arm    Number of Test 12    Control Negative    Bahia Negative    French Southern Territories Negative    Johnson Negative     Ragweed Mix 2+    Mold 1 Negative    Mold 2 Negative    Mold 3 Negative    Mold 4 Negative    Mite Mix 2+    Dog 2+    Cockroach Negative             Food Adult Perc - 02/14/24 0900     Time Antigen Placed 6644    Allergen Manufacturer Floyd Hutchinson    Location Back     Control-buffer 50% Glycerol Negative    Control-Histamine 3+    17. Coconut Negative             Previous notes and tests were reviewed. The plan was reviewed with the patient/family, and all questions/concerned were addressed.  It was my pleasure to see Lindsey Castillo today and participate in her care. Please feel free to contact me with any questions or concerns.  Sincerely,  Eudelia Hero, DO Allergy & Immunology  [facility]  [facility] office: [phone] [facility] office: [phone]

## 2024-02-14 NOTE — Patient Instructions (Addendum)
 Today's skin testing positive to grass, ragweed, weed pollen, trees, dust mites, cat, dog. Negative to coconut.  Results given.  Environmental allergies Start environmental control measures as below. Use over the counter antihistamines such as Zyrtec  (cetirizine ), Claritin (loratadine), Allegra (fexofenadine), or Xyzal  (levocetirizine) daily as needed. May take twice a day during allergy flares. May switch antihistamines every few months. Recommend allergy injections. 2 injections. Let us  know when ready to start. You have to make first shot appointment.  Had a detailed discussion with patient/family that clinical history is suggestive of allergic rhinitis, and may benefit from allergy immunotherapy (AIT). Discussed in detail regarding the dosing, schedule, side effects (mild to moderate local allergic reaction and rarely systemic allergic reactions including anaphylaxis), and benefits (significant improvement in nasal symptoms, seasonal flares of asthma) of immunotherapy with the patient. There is significant time commitment involved with allergy shots, which includes weekly immunotherapy injections for first 9-12 months and then biweekly to monthly injections for 3-5 years. Consent was signed.  Use Flonase  (fluticasone ) nasal spray 1-2 sprays per nostril once a day as needed for nasal congestion.  Nasal saline spray (i.e., Simply Saline) or nasal saline lavage (i.e., NeilMed) is recommended as needed and prior to medicated nasal sprays. Use cromolyn 4% 1 drop in each eye up to four times a day as needed for itchy/watery eyes.   Dizziness Please follow up with ENT's recommendations.   Asthma May use albuterol  rescue inhaler 2 puffs every 4 to 6 hours as needed for shortness of breath, chest tightness, coughing, and wheezing. May use albuterol  rescue inhaler 2 puffs 5 to 15 minutes prior to strenuous physical activities. Monitor frequency of use - if you need to use it more than twice per week  on a consistent basis let us  know.  Recommend to stop smoking.  Food Continue to avoid coconut. Get bloodwork. If negative will plan on in office food challenge next. For mild symptoms you can take over the counter antihistamines such as Zyrtec  10mg  to 20mg  and monitor symptoms closely.  If symptoms worsen or if you have severe symptoms including breathing issues, throat closure, significant swelling, whole body hives, severe diarrhea and vomiting, lightheadedness then seek immediate medical care.  Food challenge instructions: You must be off antihistamines for 3-5 days before. Must be in good health and not ill. No vaccines/injections/antibiotics within the past 7 days.  Plan on being in the office for 2-3 hours and must bring in the food you want to do the oral challenge for.  You must call to schedule an appointment and specify it's for a food challenge.   Heartburn Continue lifestyle and dietary modifications.  Eczema  Keep track of rashes and take pictures. See below for proper skin care. Use fragrance free and dye free products. No dryer sheets or fabric softener.    Follow up in 4 months or sooner if needed.   Reducing Pollen Exposure Pollen seasons: trees (spring), grass (summer) and ragweed/weeds (fall). Keep windows closed in your home and car to lower pollen exposure.  Install air conditioning in the bedroom and throughout the house if possible.  Avoid going out in dry windy days - especially early morning. Pollen counts are highest between 5 - 10 AM and on dry, hot and windy days.  Save outside activities for late afternoon or after a heavy rain, when pollen levels are lower.  Avoid mowing of grass if you have grass pollen allergy. Be aware that pollen can also be transported indoors on  people and pets.  Dry your clothes in an automatic dryer rather than hanging them outside where they might collect pollen.  Rinse hair and eyes before bedtime.  Control of House Dust  Mite Allergen Dust mite allergens are a common trigger of allergy and asthma symptoms. While they can be found throughout the house, these microscopic creatures thrive in warm, humid environments such as bedding, upholstered furniture and carpeting. Because so much time is spent in the bedroom, it is essential to reduce mite levels there.  Encase pillows, mattresses, and box springs in special allergen-proof fabric covers or airtight, zippered plastic covers.  Bedding should be washed weekly in hot water (130 F) and dried in a hot dryer. Allergen-proof covers are available for comforters and pillows that can't be regularly washed.  Wash the allergy-proof covers every few months. Minimize clutter in the bedroom. Keep pets out of the bedroom.  Keep humidity less than 50% by using a dehumidifier or air conditioning. You can buy a humidity measuring device called a hygrometer to monitor this.  If possible, replace carpets with hardwood, linoleum, or washable area rugs. If that's not possible, vacuum frequently with a vacuum that has a HEPA filter. Remove all upholstered furniture and non-washable window drapes from the bedroom. Remove all non-washable stuffed toys from the bedroom.  Wash stuffed toys weekly.   Pet Allergen Avoidance: Contrary to popular opinion, there are no "hypoallergenic" breeds of dogs or cats. That is because people are not allergic to an animal's hair, but to an allergen found in the animal's saliva, dander (dead skin flakes) or urine. Pet allergy symptoms typically occur within minutes. For some people, symptoms can build up and become most severe 8 to 12 hours after contact with the animal. People with severe allergies can experience reactions in public places if dander has been transported on the pet owners' clothing. Keeping an animal outdoors is only a partial solution, since homes with pets in the yard still have higher concentrations of animal allergens. Before getting a  pet, ask your allergist to determine if you are allergic to animals. If your pet is already considered part of your family, try to minimize contact and keep the pet out of the bedroom and other rooms where you spend a great deal of time. As with dust mites, vacuum carpets often or replace carpet with a hardwood floor, tile or linoleum. High-efficiency particulate air (HEPA) cleaners can reduce allergen levels over time. While dander and saliva are the source of cat and dog allergens, urine is the source of allergens from rabbits, hamsters, mice and [place] pigs; so ask a non-allergic family member to clean the animal's cage. If you have a pet allergy, talk to your allergist about the potential for allergy immunotherapy (allergy shots). This strategy can often provide long-term relief.  Skin care recommendations  Bath time: Always use lukewarm water. AVOID very hot or cold water. Keep bathing time to 5-10 minutes. Do NOT use bubble bath. Use a mild soap and use just enough to wash the dirty areas. Do NOT scrub skin vigorously.  After bathing, pat dry your skin with a towel. Do NOT rub or scrub the skin.  Moisturizers and prescriptions:  ALWAYS apply moisturizers immediately after bathing (within 3 minutes). This helps to lock-in moisture. Use the moisturizer several times a day over the whole body. Good summer moisturizers include: Aveeno, CeraVe, Cetaphil. Good winter moisturizers include: Aquaphor, Vaseline, Cerave, Cetaphil, Eucerin, Vanicream. When using moisturizers along with medications, the moisturizer  should be applied about one hour after applying the medication to prevent diluting effect of the medication or moisturize around where you applied the medications. When not using medications, the moisturizer can be continued twice daily as maintenance.  Laundry and clothing: Avoid laundry products with added color or perfumes. Use unscented hypo-allergenic laundry products such as Tide  free, Cheer free & gentle, and All free and clear.  If the skin still seems dry or sensitive, you can try double-rinsing the clothes. Avoid tight or scratchy clothing such as wool. Do not use fabric softeners or dyer sheets.

## 2024-02-17 LAB — ALLERGEN COCONUT IGE: Allergen Coconut IgE: <0.1 kU/L

## 2024-02-18 ENCOUNTER — Ambulatory Visit: Payer: Self-pay | Admitting: Allergy

## 2024-02-22 ENCOUNTER — Ambulatory Visit: Attending: Otolaryngology

## 2024-02-27 ENCOUNTER — Ambulatory Visit
Admission: RE | Admit: 2024-02-27 | Discharge: 2024-02-27 | Disposition: A | Discharge status: Home / Self Care | Source: Ambulatory Visit | Attending: Family Medicine | Admitting: Family Medicine

## 2024-02-27 VITALS — BP 118/78 | HR 83 | Temp 99.1°F | Resp 16

## 2024-02-27 DIAGNOSIS — N760 Acute vaginitis: Secondary | ICD-10-CM | POA: Diagnosis present

## 2024-02-27 DIAGNOSIS — R350 Frequency of micturition: Secondary | ICD-10-CM | POA: Insufficient documentation

## 2024-02-27 DIAGNOSIS — N898 Other specified noninflammatory disorders of vagina: Secondary | ICD-10-CM | POA: Diagnosis present

## 2024-02-27 LAB — POCT URINALYSIS DIP (MANUAL ENTRY)
Bilirubin, UA: NEGATIVE
Glucose, UA: NEGATIVE mg/dL
Nitrite, UA: NEGATIVE
Protein Ur, POC: 100 mg/dL — AB
Spec Grav, UA: 1.015 (ref 1.010–1.025)
Urobilinogen, UA: 0.2 U/dL
pH, UA: 7 (ref 5.0–8.0)

## 2024-02-27 LAB — POCT URINE PREGNANCY: Preg Test, Ur: NEGATIVE

## 2024-02-27 MED ORDER — FLUCONAZOLE 150 MG PO TABS: 150.0000 mg | ORAL_TABLET | ORAL | 0 refills | Status: AC

## 2024-02-27 NOTE — ED Triage Notes (Signed)
 Vaginal itching and discharged started 2 days go. Patient is currently on her cycle.

## 2024-02-27 NOTE — ED Provider Notes (Signed)
 [name]  Note:  This document was prepared using Conservation officer, historic buildings and may include unintentional dictation errors.  MRN: [id number] DOB: October 29, 1990  Subjective:   Lindsey Castillo is a 33 y.o. female presenting for 2-day history of acute onset urinary frequency, vaginal itching and burning, urinary urgency, thick white vaginal discharge.  Patient is currently on her cycle.  Would like STI testing, testing for urinary tract infection.  Denies fever, n/v, abdominal pain, pelvic pain, rashes, hematuria.  Patient does not drink much water.  She drinks a lot of fruit juice, sodas and tea and coffee.  No current facility-administered medications for this encounter.  Current Outpatient Medications:    cetirizine  (ZYRTEC ) 10 MG tablet, Take 1 tablet (10 mg total) by mouth daily., Disp: 90 tablet, Rfl: 0   ferrous sulfate  324 (65 Fe) MG TBEC, Take 324 mg by mouth., Disp: , Rfl:    fluticasone  (FLONASE ) 50 MCG/ACT nasal spray, Place 2 sprays into both nostrils 2 (two) times daily., Disp: 16 g, Rfl: 6   levocetirizine (XYZAL  ALLERGY  24HR) 5 MG tablet, Take 1 tablet (5 mg total) by mouth every evening., Disp: 30 tablet, Rfl: 3   albuterol  (VENTOLIN  HFA) 108 (90 Base) MCG/ACT inhaler, Inhale 2 puffs into the lungs every 4 (four) hours as needed for wheezing or shortness of breath (coughing fits)., Disp: 18 g, Rfl: 1   cromolyn  (OPTICROM ) 4 % ophthalmic solution, Place 1 drop into both eyes 4 (four) times daily as needed (itchy/watery eyes)., Disp: 10 mL, Rfl: 3   meclizine  (ANTIVERT ) 25 MG tablet, Take 1 tablet (25 mg total) by mouth 3 (three) times daily as needed for up to 30 doses for dizziness., Disp: 30 tablet, Rfl: 0   ondansetron  (ZOFRAN ) 4 MG tablet, Take 1 tablet (4 mg total) by mouth every 8 (eight) hours as needed for nausea or vomiting., Disp: 20 tablet, Rfl: 0   Allergies  Allergen Reactions   Cocamide Itching and Swelling   Coconut Fatty Acid  Itching and Swelling    Past Medical History:  Diagnosis Date   Abnormal Pap smear    last pap 07/2012   Allergy     Anemia    Anxiety    Asthma    USES INHALER   Depression    Eczema    Fracture, ankle AGE 11   Headache(784.0)    Infection    trich   Infection    BV   Infection    HX OF FREQUENT   NSVD (normal spontaneous vaginal delivery) 01/01/2013   SVD at 1525   Seasonal allergies      Past Surgical History:  Procedure Laterality Date   FOOT SURGERY Right    15 splinters in my foot    Family History  Problem Relation Age of Onset   Hypertension Father    Heart disease Father    Thyroid disease Father    Alcoholism Father    Asthma Father    Cancer Maternal Grandmother    Diabetes Maternal Grandmother    Kidney disease Maternal Grandmother    Arthritis Maternal Grandmother    Hypertension Maternal Grandmother    Depression Mother    Mental illness Mother    Birth defects Sister        HEART MURMUR   Asthma Brother    Miscarriages / [place] Cousin    Thyroid disease Sister     Social History   Tobacco Use   Smoking status:  Every Day    Current packs/day: 0.00    Average packs/day: 1 pack/day for 11.0 years (11.0 ttl pk-yrs)    Types: Cigarettes    Start date: 05/24/2001    Last attempt to quit: 05/24/2012    Years since quitting: 11.7   Smokeless tobacco: Never  Vaping Use   Vaping status: Never Used  Substance Use Topics   Alcohol use: Yes    Alcohol/week: 56.0 standard drinks of alcohol    Types: 56 Cans of beer per week    Comment: drinks (4) 24-oz beers a day   Drug use: Not Currently    ROS   Objective:   Vitals: BP 118/78 (BP Location: Left Arm)   Pulse 83   Temp 99.1 F (37.3 C) (Oral)   Resp 16   LMP 02/27/2024   SpO2 95%   Physical Exam Constitutional:      General: She is not in acute distress.    Appearance: Normal appearance. She is well-developed. She is not ill-appearing, toxic-appearing or diaphoretic.   HENT:     Head: Normocephalic and atraumatic.     Nose: Nose normal.     Mouth/Throat:     Mouth: Mucous membranes are moist.     Pharynx: Oropharynx is clear.   Eyes:     General: No scleral icterus.       Right eye: No discharge.        Left eye: No discharge.     Extraocular Movements: Extraocular movements intact.     Conjunctiva/sclera: Conjunctivae normal.    Cardiovascular:     Rate and Rhythm: Normal rate.  Pulmonary:     Effort: Pulmonary effort is normal.  Abdominal:     General: Bowel sounds are normal. There is no distension.     Palpations: Abdomen is soft. There is no mass.     Tenderness: There is no abdominal tenderness. There is no right CVA tenderness, left CVA tenderness, guarding or rebound.   Skin:    General: Skin is warm and dry.   Neurological:     General: No focal deficit present.     Mental Status: She is alert and oriented to person, place, and time.   Psychiatric:        Mood and Affect: Mood normal.        Behavior: Behavior normal.        Thought Content: Thought content normal.        Judgment: Judgment normal.     Results for orders placed or performed during the hospital encounter of 02/27/24 (from the past 24 hours)  POCT urine pregnancy     Status: None   Collection Time: 02/27/24  5:02 PM  Result Value Ref Range   Preg Test, Ur Negative Negative  POCT urinalysis dipstick     Status: Abnormal   Collection Time: 02/27/24  5:02 PM  Result Value Ref Range   Color, UA yellow yellow   Clarity, UA cloudy (A) clear   Glucose, UA negative negative mg/dL   Bilirubin, UA negative negative   Ketones, POC UA trace (5) (A) negative mg/dL   Spec Grav, UA 2.831 5.176 - 1.025   Blood, UA large (A) negative   pH, UA 7.0 5.0 - 8.0   Protein Ur, POC =100 (A) negative mg/dL   Urobilinogen, UA 0.2 0.2 or 1.0 E.U./dL   Nitrite, UA Negative Negative   Leukocytes, UA Small (1+) (A) Negative    Assessment and Plan :  PDMP not reviewed this  encounter.  1. Acute vaginitis   2. Vaginal itching   3. Urinary frequency    Will manage for acute vaginitis with fluconazole .  Emphasized need to hydrate much better with plain water and avoid urinary irritants.  Vaginal cytology and urine culture pending.  Counseled patient on potential for adverse effects with medications prescribed/recommended today, ER and return-to-clinic precautions discussed, patient verbalized understanding.    Adolph Hoop, [place] 02/27/24 1744

## 2024-02-28 ENCOUNTER — Ambulatory Visit (HOSPITAL_COMMUNITY): Payer: Self-pay

## 2024-02-28 LAB — CERVICOVAGINAL ANCILLARY ONLY
Bacterial Vaginitis (gardnerella): POSITIVE — AB
Candida Glabrata: NEGATIVE
Candida Vaginitis: NEGATIVE
Chlamydia: NEGATIVE
Comment: NEGATIVE
Comment: NEGATIVE
Comment: NEGATIVE
Comment: NEGATIVE
Comment: NEGATIVE
Comment: NORMAL
Neisseria Gonorrhea: NEGATIVE
Trichomonas: POSITIVE — AB

## 2024-02-28 MED ORDER — METRONIDAZOLE 500 MG PO TABS
500.0000 mg | ORAL_TABLET | Freq: Two times a day (BID) | ORAL | 0 refills | Status: AC
Start: 2024-02-28 — End: 2024-03-06

## 2024-03-01 LAB — URINE CULTURE: Culture: >=100000 — AB

## 2024-03-01 MED ORDER — NITROFURANTOIN MONOHYD MACRO 100 MG PO CAPS: 100.0000 mg | ORAL_CAPSULE | Freq: Two times a day (BID) | ORAL | 0 refills | Status: AC

## 2024-03-18 ENCOUNTER — Other Ambulatory Visit: Payer: Self-pay

## 2024-03-18 ENCOUNTER — Ambulatory Visit
Admission: EM | Admit: 2024-03-18 | Discharge: 2024-03-18 | Disposition: A | Discharge status: Home / Self Care | Attending: Family Medicine | Admitting: Family Medicine

## 2024-03-18 DIAGNOSIS — Z113 Encounter for screening for infections with a predominantly sexual mode of transmission: Secondary | ICD-10-CM | POA: Diagnosis not present

## 2024-03-18 NOTE — ED Provider Notes (Signed)
 [facility]    CSN: [id number] Arrival date & time: 03/18/24  1107      History   Chief Complaint No chief complaint on file.   HPI Lindsey Castillo is a 33 y.o. female presents for STD screening.  Patient was seen in urgent care June 17 for vaginal itching and dysuria.  STD screening was positive for BV and trichomonas and urine was positive for UTI.  At time of visit she was treated with fluconazole  for presumed yeast infection.  Results were in she was prescribed metronidazole  for the BV and trichomonas and Macrobid  for her UTI.  She states all symptoms have resolved and wants to get will be screened to make sure everything went away.  Denies any current symptoms including dysuria and vaginal discharge and no new concerns/exposure.  She did not have blood work last time.  No other concerns at this time.  HPI  Past Medical History:  Diagnosis Date   Abnormal Pap smear    last pap 07/2012   Allergy     Anemia    Anxiety    Asthma    USES INHALER   Depression    Eczema    Fracture, ankle AGE 36   Headache(784.0)    Infection    trich   Infection    BV   Infection    HX OF FREQUENT   NSVD (normal spontaneous vaginal delivery) 01/01/2013   SVD at 1525   Seasonal allergies     Patient Active Problem List   Diagnosis Date Noted   Major depressive disorder, recurrent episode (HCC) 02/24/2021   Suicidal overdose (HCC) 02/23/2021   Major depression in full remission (HCC) 02/11/2021   Allergic rhinitis 01/22/2021   Attention deficit disorder (ADD) in adult 01/21/2021   Generalized anxiety disorder 12/17/2020   History of alcohol abuse 12/17/2020   Panic disorder 12/17/2020   Rhabdomyolysis 04/03/2017   Anemia 04/03/2017   Tobacco abuse 04/03/2017   Vaginal discharge 04/03/2017   Gonorrhea in female 09/14/2016   Colitis, acute 09/14/2016   SIRS (systemic inflammatory response syndrome) (HCC) 09/12/2016   Right lower quadrant abdominal pain 09/12/2016   NSVD  (normal spontaneous vaginal delivery) 01/01/2013   Asthma 08/02/2012   Eczema 08/02/2012    Past Surgical History:  Procedure Laterality Date   FOOT SURGERY Right    15 splinters in my foot    OB History     Gravida  1   Para  1   Term  1   Preterm      AB      Living  1      SAB      IAB      Ectopic      Multiple      Live Births  1            Home Medications    Prior to Admission medications   Medication Sig Start Date End Date Taking? Authorizing Provider  albuterol  (VENTOLIN  HFA) 108 (90 Base) MCG/ACT inhaler Inhale 2 puffs into the lungs every 4 (four) hours as needed for wheezing or shortness of breath (coughing fits). 01/29/24   Luke Orlan HERO, DO  cetirizine  (ZYRTEC ) 10 MG tablet Take 1 tablet (10 mg total) by mouth daily. 11/04/23   Rolan Berthold, PA-C  cromolyn  (OPTICROM ) 4 % ophthalmic solution Place 1 drop into both eyes 4 (four) times daily as needed (itchy/watery eyes). 02/14/24   Luke Orlan HERO, DO  ferrous sulfate   324 (65 Fe) MG TBEC Take 324 mg by mouth. 06/02/23   [provider]  fluconazole  (DIFLUCAN ) 150 MG tablet Take 1 tablet (150 mg total) by mouth every 3 (three) days. 02/27/24   Christopher Savannah, PA-C  fluticasone  (FLONASE ) 50 MCG/ACT nasal spray Place 2 sprays into both nostrils 2 (two) times daily. 01/05/24   Soldatova, Liuba, MD  levocetirizine (XYZAL  ALLERGY  24HR) 5 MG tablet Take 1 tablet (5 mg total) by mouth every evening. 01/05/24   Soldatova, Liuba, MD  meclizine  (ANTIVERT ) 25 MG tablet Take 1 tablet (25 mg total) by mouth 3 (three) times daily as needed for up to 30 doses for dizziness. 06/19/23   Cottie Donnice PARAS, MD  ondansetron  (ZOFRAN ) 4 MG tablet Take 1 tablet (4 mg total) by mouth every 8 (eight) hours as needed for nausea or vomiting. 06/04/23   Theotis Haze ORN, NP    Family History Family History  Problem Relation Age of Onset   Hypertension Father    Heart disease Father    Thyroid disease Father    Alcoholism  Father    Asthma Father    Cancer Maternal Grandmother    Diabetes Maternal Grandmother    Kidney disease Maternal Grandmother    Arthritis Maternal Grandmother    Hypertension Maternal Grandmother    Depression Mother    Mental illness Mother    Birth defects Sister        HEART MURMUR   Asthma Brother    Miscarriages / [place] Cousin    Thyroid disease Sister     Social History Social History   Tobacco Use   Smoking status: Every Day    Current packs/day: 0.00    Average packs/day: 1 pack/day for 11.0 years (11.0 ttl pk-yrs)    Types: Cigarettes    Start date: 05/24/2001    Last attempt to quit: 05/24/2012    Years since quitting: 11.8   Smokeless tobacco: Never  Vaping Use   Vaping status: Never Used  Substance Use Topics   Alcohol use: Yes    Alcohol/week: 7.0 standard drinks of alcohol    Types: 7 Cans of beer per week    Comment: 24oz can of beer/d   Drug use: Not Currently     Allergies   Cocamide and Coconut fatty acid   Review of Systems Review of Systems  Genitourinary:        STD screening     Physical Exam Triage Vital Signs ED Triage Vitals  Encounter Vitals Group     BP 03/18/24 1129 112/78     Girls Systolic BP Percentile --      Girls Diastolic BP Percentile --      Boys Systolic BP Percentile --      Boys Diastolic BP Percentile --      Pulse Rate 03/18/24 1129 85     Resp 03/18/24 1129 16     Temp 03/18/24 1129 98.9 F (37.2 C)     Temp Source 03/18/24 1129 Oral     SpO2 03/18/24 1129 96 %     Weight --      Height --      Head Circumference --      Peak Flow --      Pain Score 03/18/24 1127 0     Pain Loc --      Pain Education --      Exclude from Growth Chart --    No data found.  Updated Vital Signs  BP 112/78   Pulse 85   Temp 98.9 F (37.2 C) (Oral)   Resp 16   LMP 02/27/2024   SpO2 96%   Visual Acuity Right Eye Distance:   Left Eye Distance:   Bilateral Distance:    Right Eye Near:   Left Eye Near:     Bilateral Near:     Physical Exam Vitals and nursing note reviewed.  Constitutional:      Appearance: Normal appearance.  HENT:     Head: Normocephalic and atraumatic.  Eyes:     Pupils: Pupils are equal, round, and reactive to light.  Cardiovascular:     Rate and Rhythm: Normal rate.  Pulmonary:     Effort: Pulmonary effort is normal.  Skin:    General: Skin is warm and dry.  Neurological:     General: No focal deficit present.     Mental Status: She is alert and oriented to person, place, and time.  Psychiatric:        Mood and Affect: Mood normal.        Behavior: Behavior normal.      UC Treatments / Results  Labs (all labs ordered are listed, but only abnormal results are displayed) Labs Reviewed  RPR  HIV ANTIBODY (ROUTINE TESTING W REFLEX)    EKG   Radiology No results found.  Procedures Procedures (including critical care time)  Medications Ordered in UC Medications - No data to display  Initial Impression / Assessment and Plan / UC Course  I have reviewed the triage vital signs and the nursing notes.  Pertinent labs & imaging results that were available during my care of the patient were reviewed by me and considered in my medical decision making (see chart for details).     Reviewed exam and symptoms with patient.  Discussed that it is too early to retest for trichomonas as she was treated 2 weeks ago.  Advise she can get retested between 4 and 6 weeks after treatment to confirm resolution.  Blood work drawn today and will contact for any positive results.  Advised PCP follow-up as needed.  ER precautions reviewed. Final Clinical Impressions(s) / UC Diagnoses   Final diagnoses:  Screening examination for STD (sexually transmitted disease)   Discharge Instructions   None    ED Prescriptions   None    PDMP not reviewed this encounter.   Loreda Myla SAUNDERS, NP 03/18/24 1145

## 2024-03-18 NOTE — ED Triage Notes (Signed)
 Pt wants blood work testing for HIV and RPR

## 2024-03-18 NOTE — Discharge Instructions (Signed)
The clinic will contact you with results of the testing done today if positive Follow-up with your PCP as needed 

## 2024-03-19 LAB — RPR: RPR Ser Ql: NONREACTIVE

## 2024-03-19 LAB — HIV ANTIBODY (ROUTINE TESTING W REFLEX): HIV Screen 4th Generation wRfx: NONREACTIVE

## 2024-04-05 ENCOUNTER — Ambulatory Visit (INDEPENDENT_AMBULATORY_CARE_PROVIDER_SITE_OTHER): Admitting: Otolaryngology

## 2024-04-05 ENCOUNTER — Telehealth (INDEPENDENT_AMBULATORY_CARE_PROVIDER_SITE_OTHER): Payer: Self-pay | Admitting: Otolaryngology

## 2024-04-05 NOTE — Telephone Encounter (Signed)
 04/05/24 LVM w/correct location for 07/29 appt.-[id number]

## 2024-04-09 ENCOUNTER — Ambulatory Visit (INDEPENDENT_AMBULATORY_CARE_PROVIDER_SITE_OTHER): Admitting: Otolaryngology

## 2024-10-23 ENCOUNTER — Encounter: Payer: Self-pay | Admitting: Obstetrics
